# Patient Record
Sex: Female | Born: 1965 | Race: Black or African American | Hispanic: No | Marital: Single | Smoking: Never smoker
Health system: Southern US, Community
[De-identification: ages and names within clinical notes are randomized; demographics above are authoritative.]

## PROBLEM LIST (undated history)

## (undated) DIAGNOSIS — K648 Other hemorrhoids: Secondary | ICD-10-CM

## (undated) DIAGNOSIS — Z8719 Personal history of other diseases of the digestive system: Secondary | ICD-10-CM

## (undated) DIAGNOSIS — D219 Benign neoplasm of connective and other soft tissue, unspecified: Secondary | ICD-10-CM

## (undated) DIAGNOSIS — R2231 Localized swelling, mass and lump, right upper limb: Principal | ICD-10-CM

## (undated) DIAGNOSIS — F909 Attention-deficit hyperactivity disorder, unspecified type: Secondary | ICD-10-CM

## (undated) DIAGNOSIS — D649 Anemia, unspecified: Secondary | ICD-10-CM

## (undated) DIAGNOSIS — R7301 Impaired fasting glucose: Secondary | ICD-10-CM

## (undated) DIAGNOSIS — I10 Essential (primary) hypertension: Secondary | ICD-10-CM

## (undated) DIAGNOSIS — E559 Vitamin D deficiency, unspecified: Secondary | ICD-10-CM

## (undated) DIAGNOSIS — K5790 Diverticulosis of intestine, part unspecified, without perforation or abscess without bleeding: Secondary | ICD-10-CM

## (undated) DIAGNOSIS — M549 Dorsalgia, unspecified: Secondary | ICD-10-CM

## (undated) DIAGNOSIS — Z5189 Encounter for other specified aftercare: Secondary | ICD-10-CM

## (undated) DIAGNOSIS — E669 Obesity, unspecified: Secondary | ICD-10-CM

## (undated) HISTORY — DX: Impaired fasting glucose: R73.01

## (undated) HISTORY — DX: Dorsalgia, unspecified: M54.9

## (undated) HISTORY — DX: Anemia, unspecified: D64.9

## (undated) HISTORY — DX: Essential (primary) hypertension: I10

## (undated) HISTORY — PX: SKIN GRAFT: SHX250

## (undated) HISTORY — DX: Personal history of other diseases of the digestive system: Z87.19

## (undated) HISTORY — DX: Attention-deficit hyperactivity disorder, unspecified type: F90.9

## (undated) HISTORY — DX: Obesity, unspecified: E66.9

## (undated) HISTORY — DX: Encounter for other specified aftercare: Z51.89

## (undated) HISTORY — DX: Vitamin D deficiency, unspecified: E55.9

## (undated) HISTORY — DX: Other hemorrhoids: K64.8

## (undated) HISTORY — DX: Diverticulosis of intestine, part unspecified, without perforation or abscess without bleeding: K57.90

## (undated) HISTORY — DX: Benign neoplasm of connective and other soft tissue, unspecified: D21.9

## (undated) HISTORY — PX: OTHER SURGICAL HISTORY: SHX169

## (undated) HISTORY — PX: BREAST EXCISIONAL BIOPSY: SUR124

---

## 1989-09-22 HISTORY — PX: CHOLECYSTECTOMY: SHX55

## 1999-07-09 ENCOUNTER — Encounter: Payer: Self-pay | Admitting: Family Medicine

## 1999-07-09 ENCOUNTER — Ambulatory Visit (HOSPITAL_COMMUNITY): Admission: RE | Admit: 1999-07-09 | Discharge: 1999-07-09 | Payer: Self-pay | Admitting: Family Medicine

## 1999-09-17 ENCOUNTER — Ambulatory Visit: Admission: RE | Admit: 1999-09-17 | Discharge: 1999-09-17 | Payer: Self-pay | Admitting: Family Medicine

## 2000-09-03 ENCOUNTER — Encounter: Admission: RE | Admit: 2000-09-03 | Discharge: 2000-12-02 | Payer: Self-pay | Admitting: Family Medicine

## 2001-08-22 ENCOUNTER — Emergency Department (HOSPITAL_COMMUNITY): Admission: EM | Admit: 2001-08-22 | Discharge: 2001-08-22 | Payer: Self-pay

## 2003-05-23 ENCOUNTER — Ambulatory Visit (HOSPITAL_COMMUNITY): Admission: RE | Admit: 2003-05-23 | Discharge: 2003-05-23 | Payer: Self-pay | Admitting: Family Medicine

## 2003-05-23 ENCOUNTER — Encounter: Payer: Self-pay | Admitting: Family Medicine

## 2003-10-06 ENCOUNTER — Ambulatory Visit (HOSPITAL_COMMUNITY): Admission: RE | Admit: 2003-10-06 | Discharge: 2003-10-06 | Payer: Self-pay | Admitting: Obstetrics & Gynecology

## 2004-07-10 ENCOUNTER — Ambulatory Visit: Payer: Self-pay | Admitting: Internal Medicine

## 2004-07-15 ENCOUNTER — Ambulatory Visit: Payer: Self-pay | Admitting: Family Medicine

## 2004-08-19 ENCOUNTER — Ambulatory Visit: Payer: Self-pay | Admitting: Family Medicine

## 2004-08-19 ENCOUNTER — Ambulatory Visit: Payer: Self-pay | Admitting: *Deleted

## 2004-09-26 ENCOUNTER — Ambulatory Visit: Payer: Self-pay | Admitting: Family Medicine

## 2004-10-03 ENCOUNTER — Ambulatory Visit: Payer: Self-pay | Admitting: Family Medicine

## 2004-12-30 ENCOUNTER — Ambulatory Visit: Payer: Self-pay | Admitting: Internal Medicine

## 2005-01-13 ENCOUNTER — Ambulatory Visit: Payer: Self-pay | Admitting: Family Medicine

## 2005-10-13 ENCOUNTER — Ambulatory Visit: Payer: Self-pay | Admitting: Family Medicine

## 2006-02-02 ENCOUNTER — Ambulatory Visit: Payer: Self-pay | Admitting: Family Medicine

## 2006-06-17 ENCOUNTER — Ambulatory Visit: Payer: Self-pay | Admitting: Internal Medicine

## 2006-07-03 ENCOUNTER — Ambulatory Visit: Payer: Self-pay | Admitting: Internal Medicine

## 2007-07-19 ENCOUNTER — Ambulatory Visit: Payer: Self-pay | Admitting: Family Medicine

## 2007-07-29 ENCOUNTER — Encounter (INDEPENDENT_AMBULATORY_CARE_PROVIDER_SITE_OTHER): Payer: Self-pay | Admitting: Internal Medicine

## 2007-11-30 ENCOUNTER — Ambulatory Visit: Payer: Self-pay | Admitting: Family Medicine

## 2008-01-11 ENCOUNTER — Ambulatory Visit: Payer: Self-pay | Admitting: Family Medicine

## 2008-02-15 ENCOUNTER — Ambulatory Visit: Payer: Self-pay | Admitting: Family Medicine

## 2008-02-28 ENCOUNTER — Ambulatory Visit: Payer: Self-pay | Admitting: Family Medicine

## 2008-03-02 ENCOUNTER — Ambulatory Visit: Payer: Self-pay | Admitting: Family Medicine

## 2008-07-17 ENCOUNTER — Ambulatory Visit: Payer: Self-pay | Admitting: Family Medicine

## 2008-08-23 ENCOUNTER — Ambulatory Visit: Payer: Self-pay | Admitting: Family Medicine

## 2009-02-13 ENCOUNTER — Ambulatory Visit: Payer: Self-pay | Admitting: Family Medicine

## 2009-06-20 ENCOUNTER — Ambulatory Visit: Payer: Self-pay | Admitting: Family Medicine

## 2009-06-20 ENCOUNTER — Other Ambulatory Visit: Admission: RE | Admit: 2009-06-20 | Discharge: 2009-06-20 | Payer: Self-pay | Admitting: Family Medicine

## 2009-06-20 LAB — HM PAP SMEAR: HM Pap smear: NEGATIVE

## 2009-07-24 ENCOUNTER — Ambulatory Visit (HOSPITAL_COMMUNITY): Admission: RE | Admit: 2009-07-24 | Discharge: 2009-07-24 | Payer: Self-pay | Admitting: Family Medicine

## 2009-09-05 ENCOUNTER — Ambulatory Visit: Payer: Self-pay | Admitting: Family Medicine

## 2009-10-30 ENCOUNTER — Ambulatory Visit: Payer: Self-pay | Admitting: Family Medicine

## 2010-03-05 ENCOUNTER — Ambulatory Visit: Payer: Self-pay | Admitting: Physician Assistant

## 2010-04-03 ENCOUNTER — Ambulatory Visit: Payer: Self-pay | Admitting: Physician Assistant

## 2010-04-22 ENCOUNTER — Ambulatory Visit: Payer: Self-pay | Admitting: Physician Assistant

## 2010-10-11 ENCOUNTER — Ambulatory Visit (HOSPITAL_COMMUNITY)
Admission: RE | Admit: 2010-10-11 | Discharge: 2010-10-11 | Payer: Self-pay | Source: Home / Self Care | Attending: Family Medicine | Admitting: Family Medicine

## 2010-10-11 LAB — HM MAMMOGRAPHY

## 2010-10-18 ENCOUNTER — Encounter
Admission: RE | Admit: 2010-10-18 | Discharge: 2010-10-18 | Payer: Self-pay | Source: Home / Self Care | Attending: Family Medicine | Admitting: Family Medicine

## 2010-10-24 ENCOUNTER — Encounter: Payer: Self-pay | Admitting: Physician Assistant

## 2010-10-24 ENCOUNTER — Ambulatory Visit: Admit: 2010-10-24 | Payer: Self-pay | Admitting: Obstetrics & Gynecology

## 2010-11-04 ENCOUNTER — Other Ambulatory Visit: Payer: Self-pay | Admitting: Obstetrics and Gynecology

## 2010-11-04 ENCOUNTER — Other Ambulatory Visit (HOSPITAL_COMMUNITY)
Admission: RE | Admit: 2010-11-04 | Discharge: 2010-11-04 | Disposition: A | Discharge status: Home / Self Care | Payer: BC Managed Care – PPO | Source: Ambulatory Visit | Attending: Obstetrics and Gynecology | Admitting: Obstetrics and Gynecology

## 2010-11-04 ENCOUNTER — Encounter: Payer: BC Managed Care – PPO | Admitting: Obstetrics and Gynecology

## 2010-11-04 DIAGNOSIS — N841 Polyp of cervix uteri: Secondary | ICD-10-CM

## 2010-11-28 ENCOUNTER — Institutional Professional Consult (permissible substitution) (INDEPENDENT_AMBULATORY_CARE_PROVIDER_SITE_OTHER): Payer: BC Managed Care – PPO | Admitting: Family Medicine

## 2010-11-28 DIAGNOSIS — D649 Anemia, unspecified: Secondary | ICD-10-CM

## 2010-11-28 DIAGNOSIS — I10 Essential (primary) hypertension: Secondary | ICD-10-CM

## 2010-11-28 DIAGNOSIS — Z79899 Other long term (current) drug therapy: Secondary | ICD-10-CM

## 2010-11-28 DIAGNOSIS — E119 Type 2 diabetes mellitus without complications: Secondary | ICD-10-CM

## 2010-12-13 NOTE — Progress Notes (Unsigned)
Crystal [name], MAESTRE NO.:  [id number]  MEDICAL RECORD NO.:  [id number]           PATIENT TYPE:  A  LOCATION:  [facility]                   FACILITY:  [facility]  PHYSICIAN:  Argentina Donovan, MD        DATE OF BIRTH:  [birth year]  DATE OF SERVICE:  11/04/2010                                 CLINIC NOTE  HISTORY OF PRESENT ILLNESS:  The patient is a 45 year old gravida 4, para 2-0-2-2 with one C-section.  Both children above 17 years old who [facility].  She was recently there for routine Pap which have all been normal recently and she was told she had a cervical polyp, and that is why sent her.  She has had no problems.  No complaints, normal periods.  She did go for a mammogram, and they called her back for repeat and found a small cyst in the left breast with benign configuration, otherwise normal.  She has no complaints.  She takes Adderall, multivitamins, __________ and losartan.  PHYSICAL EXAMINATION:  VITAL SIGNS:  Today, her blood pressure is 136/81.  She weighs 281 pounds, she is 5 feet 5 inches tall and her age is 73. ABDOMEN:  Soft, flat, and nontender.  No masses.  No organomegaly. GENITALIA:  External is normal.  BUN is within normal limits.  Vagina is clean and well rugated.  Cervix is clean, parous and a small polyp was noted within the cervical os completely cervical and was easily removed with ring forceps and sent for pathological diagnosis.  I have reassured her that these are almost always normal and that we would not have her come back, and we would call if there is any abnormality.  IMPRESSION:  Asymptomatic cervical polyp.          ______________________________ Argentina Donovan, MD    PR/MEDQ  D:  11/04/2010  T:  11/05/2010  Job:  [id number]

## 2010-12-18 ENCOUNTER — Ambulatory Visit (INDEPENDENT_AMBULATORY_CARE_PROVIDER_SITE_OTHER): Payer: BC Managed Care – PPO | Admitting: Family Medicine

## 2010-12-18 DIAGNOSIS — J069 Acute upper respiratory infection, unspecified: Secondary | ICD-10-CM

## 2011-02-04 ENCOUNTER — Telehealth: Payer: Self-pay | Admitting: Family Medicine

## 2011-02-04 NOTE — Telephone Encounter (Signed)
[facility] for pt meds losartan hctz 100/25mg  #30 with prn refills

## 2011-03-06 ENCOUNTER — Telehealth: Payer: Self-pay | Admitting: Family Medicine

## 2011-03-06 DIAGNOSIS — F909 Attention-deficit hyperactivity disorder, unspecified type: Secondary | ICD-10-CM

## 2011-03-06 MED ORDER — AMPHETAMINE-DEXTROAMPHETAMINE 20 MG PO TABS: 20.0000 mg | ORAL_TABLET | Freq: Two times a day (BID) | ORAL | Status: DC

## 2011-03-06 NOTE — Telephone Encounter (Signed)
Called pt, she has been notified that rx is ready for pickup.

## 2011-03-06 NOTE — Telephone Encounter (Signed)
Advise pt rx is ready

## 2011-03-11 ENCOUNTER — Other Ambulatory Visit: Payer: Self-pay | Admitting: Family Medicine

## 2011-03-11 DIAGNOSIS — R922 Inconclusive mammogram: Secondary | ICD-10-CM

## 2011-03-20 ENCOUNTER — Ambulatory Visit
Admission: RE | Admit: 2011-03-20 | Discharge: 2011-03-20 | Disposition: A | Discharge status: Home / Self Care | Payer: BC Managed Care – PPO | Source: Ambulatory Visit | Attending: Family Medicine | Admitting: Family Medicine

## 2011-03-20 DIAGNOSIS — R922 Inconclusive mammogram: Secondary | ICD-10-CM

## 2011-05-28 ENCOUNTER — Encounter: Payer: Self-pay | Admitting: Family Medicine

## 2011-06-02 ENCOUNTER — Encounter: Payer: Self-pay | Admitting: Family Medicine

## 2011-06-02 ENCOUNTER — Ambulatory Visit (INDEPENDENT_AMBULATORY_CARE_PROVIDER_SITE_OTHER): Payer: BC Managed Care – PPO | Admitting: Family Medicine

## 2011-06-02 ENCOUNTER — Other Ambulatory Visit: Payer: Self-pay | Admitting: Family Medicine

## 2011-06-02 VITALS — BP 110/80 | HR 76 | Ht 65.5 in | Wt 276.0 lb

## 2011-06-02 DIAGNOSIS — Z23 Encounter for immunization: Secondary | ICD-10-CM

## 2011-06-02 DIAGNOSIS — D509 Iron deficiency anemia, unspecified: Secondary | ICD-10-CM

## 2011-06-02 DIAGNOSIS — J309 Allergic rhinitis, unspecified: Secondary | ICD-10-CM

## 2011-06-02 DIAGNOSIS — F909 Attention-deficit hyperactivity disorder, unspecified type: Secondary | ICD-10-CM

## 2011-06-02 DIAGNOSIS — E119 Type 2 diabetes mellitus without complications: Secondary | ICD-10-CM | POA: Insufficient documentation

## 2011-06-02 DIAGNOSIS — L680 Hirsutism: Secondary | ICD-10-CM

## 2011-06-02 DIAGNOSIS — F988 Other specified behavioral and emotional disorders with onset usually occurring in childhood and adolescence: Secondary | ICD-10-CM

## 2011-06-02 DIAGNOSIS — L0291 Cutaneous abscess, unspecified: Secondary | ICD-10-CM

## 2011-06-02 DIAGNOSIS — E785 Hyperlipidemia, unspecified: Secondary | ICD-10-CM

## 2011-06-02 DIAGNOSIS — I10 Essential (primary) hypertension: Secondary | ICD-10-CM | POA: Insufficient documentation

## 2011-06-02 LAB — CBC WITH DIFFERENTIAL/PLATELET
Basophils Relative: 0 % (ref 0–1)
Eosinophils Absolute: 0.3 10*3/uL (ref 0.0–0.7)
Hemoglobin: 10 g/dL — ABNORMAL LOW (ref 12.0–15.0)
Lymphs Abs: 2 10*3/uL (ref 0.7–4.0)
MCH: 22.7 pg — ABNORMAL LOW (ref 26.0–34.0)
Neutro Abs: 5.4 10*3/uL (ref 1.7–7.7)
Neutrophils Relative %: 67 % (ref 43–77)
Platelets: 370 10*3/uL (ref 150–400)
RBC: 4.41 MIL/uL (ref 3.87–5.11)

## 2011-06-02 LAB — POCT GLYCOSYLATED HEMOGLOBIN (HGB A1C): Hemoglobin A1C: 5.9

## 2011-06-02 LAB — LIPID PANEL
Cholesterol: 148 mg/dL (ref 0–200)
Triglycerides: 89 mg/dL (ref <150)

## 2011-06-02 MED ORDER — EFLORNITHINE HCL 13.9 % EX CREA: TOPICAL_CREAM | CUTANEOUS | Status: DC

## 2011-06-02 MED ORDER — CEPHALEXIN 500 MG PO CAPS: 500.0000 mg | ORAL_CAPSULE | Freq: Three times a day (TID) | ORAL | Status: AC

## 2011-06-02 MED ORDER — AMPHETAMINE-DEXTROAMPHETAMINE 20 MG PO TABS: 20.0000 mg | ORAL_TABLET | Freq: Two times a day (BID) | ORAL | Status: DC

## 2011-06-02 NOTE — Progress Notes (Signed)
Patient presents for f/u on multiple medical issues, as well as having acute concerns:  ADD--only takes the Adderall on days she has classes, and it works very well.  Denies side effects, and needs a refill.  DM--hasn't been checking her sugars at home.  Hasn't been as careful with her diet over the last 2 weeks since school started again, but was very good over the summer.  Doing water aerobics and walking, and some Zumba (exercising 4x/week).  Last eye exam was 09/2010.  Denies polydipsia or polyuria.  Checking feet regularly without concerns.  Anemia--taking iron once daily.  Just finished her menstrual cycle.  Energy is okay.  Hyperlipidemia--Admits to never starting the fish oil.  She is afraid of having a fishy odor to her urine, as this happens when she eats seafood.  HTN--She periodically checks her BP with her mother's cuff, running 117/77; hasn't checked in the last 2 weeks since she's been having headaches.  Patient is complaining of headaches x 2 weeks.  Having some runny nose, itchy throat and ears.  Having a R temporal headache, and below both eyes, more in the afternoons than mornings.  +chews gums.  Patient reports a boil under L axilla that drained about 3 weeks ago after using hot compresses. Still has residual discomfort. She no longer shaves axilla, changed to cutting hairs to avoid ingrown hairs.   Patient wants to try the Vaniqa that we previously discussed.  We previously discussed other options, and she would now like to try this.  Past Medical History  Diagnosis Date  . Hypertension   . Obesity   . ADHD (attention deficit hyperactivity disorder)   . Anemia   . Diabetes mellitus     AODM  . Impaired fasting glucose     Past Surgical History  Procedure Date  . Uterine 10/2010    POLYP    History   Social History  . Marital Status: Single    Spouse Name: N/A    Number of Children: N/A  . Years of Education: N/A   Occupational History  . Not on file.    Social History Main Topics  . Smoking status: Never Smoker   . Smokeless tobacco: Never Used  . Alcohol Use: No  . Drug Use: No  . Sexually Active: Not on file   Other Topics Concern  . Not on file   Social History Narrative   In school for business administration, as well as bus driver for [employer]   Current outpatient prescriptions:amphetamine-dextroamphetamine (ADDERALL, 20MG ,) 20 MG tablet, Take 1 tablet (20 mg total) by mouth 2 (two) times daily., Disp: 60 tablet, Rfl: 0;  Ferrous Sulfate (IRON) 325 (65 FE) MG TABS, Take 1 tablet by mouth daily.  , Disp: , Rfl: ;  Loratadine 10 MG CAPS, Take 1 capsule by mouth daily. As needed for allergies , Disp: , Rfl:  losartan-hydrochlorothiazide (HYZAAR) 100-25 MG per tablet, Take 1 tablet by mouth daily.  , Disp: , Rfl: ;  Saxagliptin-Metformin (KOMBIGLYZE XR) 2.01-999 MG TB24, Take 1,000 mg by mouth daily.  , Disp: , Rfl: ;  cephALEXin (KEFLEX) 500 MG capsule, Take 1 capsule (500 mg total) by mouth 3 (three) times daily., Disp: 30 capsule, Rfl: 0;  Eflornithine HCl (VANIQA) 13.9 % cream, Apply to affect areas twice daily, Disp: 30 g, Rfl: 5  No Known Allergies  ROS: Denies fevers; mucus drainage is clear.  Slight cough from PND; denies SOB or wheezing.  Denies any GI  effects, myalgias, chest pain, palpitations, edema  PHYSICAL EXAM: BP 110/80  Pulse 76  Ht 5' 5.5" (1.664 m)  Wt 276 lb (125.193 kg)  BMI 45.23 kg/m2  LMP 05/24/2011 Well developed, pleasant obese female in no distress HEENT: PERRL, EOMI, conjunctiva clear; Nasal mucosa moderately edematous, pale/bluish. Sinuses nontender, OP clear Heart: regular rate and rhythm without murmur Lungs: clear bilaterally Neck: no lymphadenopathy, thyromegaly or carotid bruit Abdomen: obese, nontender, no organomegaly or mass Extremities: trace edema, 2+ pulse, normal sensation Skin: L axilla--recent abscess--there is some residual induration, no erythema/warmth/fluctuance.   No lymphadenopathy. Psych: normal mood, affect, hygiene and grooming  ASSESSMENT/PLAN: 1. Type II or unspecified type diabetes mellitus without mention of complication, not stated as uncontrolled  POCT HgB A1C  2. ADD (attention deficit disorder)    3. Essential hypertension, benign    4. Dyslipidemia  Lipid panel  5. Allergic rhinitis, cause unspecified    6. ADHD (attention deficit hyperactivity disorder)  amphetamine-dextroamphetamine (ADDERALL, 20MG ,) 20 MG tablet  7. Anemia, iron deficiency  CBC with Differential  8. Abscess of skin  cephALEXin (KEFLEX) 500 MG capsule  9. Hirsutism  Eflornithine HCl (VANIQA) 13.9 % cream  10. Need for prophylactic vaccination and inoculation against influenza  Flu vaccine greater than or equal to 3yo preservative free IM    Chart reviewed. Last Td 2004. No pneumovax. Labs 11/2010 Hg 10.3, Hct 33.7, normal c-met.  Lipids chol 148, HDL 34, LDL 92, ratio 4.4.  Last TSH 02/2010  DM--A1c 5.9; well controlled; continue current meds; weight loss encouraged HTN--well controlled.  Recommended increase exercise, resume proper diet, and attempt weight loss Abscess L axilla--partially treated by draining on its own.  Will cover with Keflex for any residual infection Headaches--component of sinus headache and tension headache.  Recommend cutting back on gum chewing, and use of Tylenol and/or ibuprofen prn for headaches, in addition to allergy medication  Discussed but declined pneumovax.  Will get flu shot today.  At some point consider getting TdaP

## 2011-06-02 NOTE — Patient Instructions (Addendum)
Consider pneumovax (pneumonia vaccine) Consider getting TdaP (tetanus booster with pertussis) Continue with warm compresses to left underarm area.  Return for re-evaluation if the area becomes increasingly swollen, red, tender, etc.

## 2011-06-04 ENCOUNTER — Telehealth: Payer: Self-pay | Admitting: *Deleted

## 2011-06-04 NOTE — Telephone Encounter (Signed)
Left message for patient to return my call to go over lab results. 

## 2011-06-04 NOTE — Telephone Encounter (Signed)
Spoke with patient and went over lab results with her. She will begin taking her iron supplement twice daily with meals and start taking fish oil. Patient to follow up with Dr.Knapp in 6 months ~11/30/11.

## 2011-08-12 ENCOUNTER — Telehealth: Payer: Self-pay | Admitting: Family Medicine

## 2011-08-12 DIAGNOSIS — F909 Attention-deficit hyperactivity disorder, unspecified type: Secondary | ICD-10-CM

## 2011-08-12 MED ORDER — AMPHETAMINE-DEXTROAMPHETAMINE 20 MG PO TABS: 20.0000 mg | ORAL_TABLET | Freq: Two times a day (BID) | ORAL | Status: DC

## 2011-08-12 NOTE — Telephone Encounter (Signed)
Left message for pt to call back  °

## 2011-08-12 NOTE — Telephone Encounter (Signed)
Printed/signed.  Advise pt ready for pick up

## 2011-08-12 NOTE — Telephone Encounter (Signed)
Pt need rx refill on Adderall 20 mg  Bid

## 2011-08-13 NOTE — Telephone Encounter (Signed)
Pt notified and will come by and pick up

## 2011-08-18 ENCOUNTER — Other Ambulatory Visit: Payer: Self-pay | Admitting: Family Medicine

## 2011-08-18 DIAGNOSIS — N6320 Unspecified lump in the left breast, unspecified quadrant: Secondary | ICD-10-CM

## 2011-08-18 DIAGNOSIS — R9230 Dense breasts, unspecified: Secondary | ICD-10-CM

## 2011-08-18 DIAGNOSIS — R922 Inconclusive mammogram: Secondary | ICD-10-CM

## 2011-09-03 ENCOUNTER — Ambulatory Visit
Admission: RE | Admit: 2011-09-03 | Discharge: 2011-09-03 | Disposition: A | Discharge status: Home / Self Care | Payer: BC Managed Care – PPO | Source: Ambulatory Visit | Attending: Family Medicine | Admitting: Family Medicine

## 2011-09-03 ENCOUNTER — Other Ambulatory Visit: Payer: Self-pay | Admitting: Family Medicine

## 2011-09-03 DIAGNOSIS — N6320 Unspecified lump in the left breast, unspecified quadrant: Secondary | ICD-10-CM

## 2011-09-03 DIAGNOSIS — R9230 Dense breasts, unspecified: Secondary | ICD-10-CM

## 2011-09-03 DIAGNOSIS — R922 Inconclusive mammogram: Secondary | ICD-10-CM

## 2011-10-08 ENCOUNTER — Encounter: Payer: Self-pay | Admitting: *Deleted

## 2011-10-09 ENCOUNTER — Telehealth: Payer: Self-pay | Admitting: Internal Medicine

## 2011-10-09 DIAGNOSIS — F909 Attention-deficit hyperactivity disorder, unspecified type: Secondary | ICD-10-CM

## 2011-10-09 MED ORDER — AMPHETAMINE-DEXTROAMPHETAMINE 20 MG PO TABS: 20.0000 mg | ORAL_TABLET | Freq: Two times a day (BID) | ORAL | Status: DC

## 2011-10-09 NOTE — Telephone Encounter (Signed)
Patient notified that her rx is ready for pickup, she also scheduled 30 min med ck for 12/11/11 ! 9:45am.

## 2011-10-09 NOTE — Telephone Encounter (Signed)
She is due for 6 month med check in March (30 min).  Please schedule.  Rx printed/signed

## 2011-11-20 ENCOUNTER — Encounter: Payer: BC Managed Care – PPO | Admitting: Obstetrics & Gynecology

## 2011-11-25 ENCOUNTER — Other Ambulatory Visit: Payer: Self-pay | Admitting: Internal Medicine

## 2011-11-25 ENCOUNTER — Encounter: Payer: Self-pay | Admitting: Family Medicine

## 2011-11-25 DIAGNOSIS — F909 Attention-deficit hyperactivity disorder, unspecified type: Secondary | ICD-10-CM

## 2011-11-25 MED ORDER — AMPHETAMINE-DEXTROAMPHETAMINE 20 MG PO TABS: 20.0000 mg | ORAL_TABLET | Freq: Two times a day (BID) | ORAL | Status: DC

## 2011-11-25 NOTE — Telephone Encounter (Signed)
Please print for me to sign Wed.  She has f/u scheduled for later this month (3/21)

## 2011-11-25 NOTE — Telephone Encounter (Signed)
Printed and laid on your desk to sign

## 2011-12-10 ENCOUNTER — Encounter: Payer: BC Managed Care – PPO | Admitting: Obstetrics & Gynecology

## 2011-12-11 ENCOUNTER — Encounter: Payer: BC Managed Care – PPO | Admitting: Family Medicine

## 2011-12-25 ENCOUNTER — Encounter: Payer: Self-pay | Admitting: Family Medicine

## 2011-12-25 ENCOUNTER — Ambulatory Visit (INDEPENDENT_AMBULATORY_CARE_PROVIDER_SITE_OTHER): Payer: BC Managed Care – PPO | Admitting: Family Medicine

## 2011-12-25 VITALS — BP 120/70 | HR 80 | Ht 65.0 in | Wt 283.0 lb

## 2011-12-25 DIAGNOSIS — E119 Type 2 diabetes mellitus without complications: Secondary | ICD-10-CM

## 2011-12-25 DIAGNOSIS — D509 Iron deficiency anemia, unspecified: Secondary | ICD-10-CM

## 2011-12-25 DIAGNOSIS — F988 Other specified behavioral and emotional disorders with onset usually occurring in childhood and adolescence: Secondary | ICD-10-CM

## 2011-12-25 DIAGNOSIS — F909 Attention-deficit hyperactivity disorder, unspecified type: Secondary | ICD-10-CM

## 2011-12-25 DIAGNOSIS — I10 Essential (primary) hypertension: Secondary | ICD-10-CM

## 2011-12-25 LAB — COMPREHENSIVE METABOLIC PANEL
ALT: 13 U/L (ref 0–35)
Albumin: 3.7 g/dL (ref 3.5–5.2)
CO2: 29 mEq/L (ref 19–32)
Calcium: 9 mg/dL (ref 8.4–10.5)
Chloride: 104 mEq/L (ref 96–112)
Glucose, Bld: 110 mg/dL — ABNORMAL HIGH (ref 70–99)
Sodium: 141 mEq/L (ref 135–145)
Total Protein: 6.8 g/dL (ref 6.0–8.3)

## 2011-12-25 LAB — POCT GLYCOSYLATED HEMOGLOBIN (HGB A1C): Hemoglobin A1C: 6.2

## 2011-12-25 MED ORDER — AMPHETAMINE-DEXTROAMPHETAMINE 20 MG PO TABS: 20.0000 mg | ORAL_TABLET | Freq: Two times a day (BID) | ORAL | Status: DC

## 2011-12-25 MED ORDER — METFORMIN HCL ER 500 MG PO TB24: 1000.0000 mg | ORAL_TABLET | Freq: Every day | ORAL | Status: DC

## 2011-12-25 NOTE — Patient Instructions (Signed)
Exercise daily.  Diet for GERD or PUD Nutrition therapy can help ease the discomfort of gastroesophageal reflux disease (GERD) and peptic ulcer disease (PUD).  HOME CARE INSTRUCTIONS   Eat your meals slowly, in a relaxed setting.   Eat 5 to 6 small meals per day.   If a food causes distress, stop eating it for a period of time.  FOODS TO AVOID  Coffee, regular or decaffeinated.   Cola beverages, regular or low calorie.   Tea, regular or decaffeinated.   Pepper.   Cocoa.   High fat foods, including meats.   Butter, margarine, hydrogenated oil (trans fats).   Peppermint or spearmint (if you have GERD).   Fruits and vegetables if not tolerated.   Alcohol.   Nicotine (smoking or chewing). This is one of the most potent stimulants to acid production in the gastrointestinal tract.   Any food that seems to aggravate your condition.  If you have questions regarding your diet, ask your caregiver or a registered dietitian. TIPS  Lying flat may make symptoms worse. Keep the head of your bed raised 6 to 9 inches (15 to 23 cm) by using a foam wedge or blocks under the legs of the bed.   Do not lay down until 3 hours after eating a meal.   Daily physical activity may help reduce symptoms.  MAKE SURE YOU:   Understand these instructions.   Will watch your condition.   Will get help right away if you are not doing well or get worse.  Document Released: 09/08/2005 Document Revised: 08/28/2011 Document Reviewed: 07/25/2011 [facility] Patient Information 2012 [place], [place].

## 2011-12-25 NOTE — Progress Notes (Signed)
Chief complaint:  6 month med checkon diabetes, hypertension and ADD. Pt is on Kombiglyze and it is making her eyes really sesnsitive, metformin mader her nauseous but she feels that she can tolerate it if she could go back on metformin. Also taking adderall 20mg  BID and feels that she needs an increase to possibly TID as she is in school currently and BID is not effective  HPI:   DM:  Sugars are averaging 94 on the Kombiglyze. Has been on this for over a year.  Has noticed eye sensitivity for about a year also, she is wondering if it could be related to medicine.  Her eye exam is UTD and told everything was fine.  Eyes are sensitive to light.  Also has allergies, and is taking OTC pill for allergies, which helps with the watery eyes, but still seem sensitive, which isn't related to eye sensitivity (occurs all year long, allergies just started flaring).  Wondering if she could go back to just plain metformin.  Review of chart shows that she had nausea with metformin in the past (not ER).  Admits to stress eating since January, and not having time to exercise since starting math class.  HTN:  BP's at pharmacy (using it on her wrist only, due to arm size) 132/92 (? Accuracy).  Denies dizziness, has some stress-related headaches.  Denies chest pain, just some heartburn. Denies swelling, but getting some leg cramps.  ADD:  Previously was taking 20mg  BID, but when she started up a new, very difficult math class, had a lot of trouble with it.  Increased to 2 tablets in the morning, and 1 at 2pm and this has been working well for her.  Has been doing this x 3 months.  She doesn't take meds on the weekends, or when she is out of class (ie spring break), just 4 days/week, so the 60 tabs/month have been lasting the month.  Anemia: takes iron every other day, just once daily.  On her cycle right now. Never took it daily as recommended due to risk of constipation.  Lipids--hasn't been exercising much since January  (HDL has been low).  She reports that she bought fish oil capsules, but hasn't started taking them.  Past Medical History  Diagnosis Date  . Hypertension   . Obesity   . ADHD (attention deficit hyperactivity disorder)   . Anemia   . Diabetes mellitus     AODM  . Impaired fasting glucose     Past Surgical History  Procedure Date  . Uterine 10/2010    POLYP    History   Social History  . Marital Status: Single    Spouse Name: N/A    Number of Children: N/A  . Years of Education: N/A   Occupational History  . Not on file.   Social History Main Topics  . Smoking status: Never Smoker   . Smokeless tobacco: Never Used  . Alcohol Use: No  . Drug Use: No  . Sexually Active: Not on file   Other Topics Concern  . Not on file   Social History Narrative   In school for business administration, as well as bus driver for [employer]   Current Outpatient Prescriptions on File Prior to Visit  Medication Sig Dispense Refill  . Ferrous Sulfate (IRON) 325 (65 FE) MG TABS Take 1 tablet by mouth daily.        . Loratadine 10 MG CAPS Take 1 capsule by mouth daily. As needed  for allergies       . losartan-hydrochlorothiazide (HYZAAR) 100-25 MG per tablet Take 1 tablet by mouth daily.        Marland Kitchen DISCONTD: amphetamine-dextroamphetamine (ADDERALL, 20MG ,) 20 MG tablet Take 1 tablet (20 mg total) by mouth 2 (two) times daily.  60 tablet  0  . Eflornithine HCl (VANIQA) 13.9 % cream Apply to affect areas twice daily  30 g  5  . metFORMIN (GLUCOPHAGE-XR) 500 MG 24 hr tablet Take 2 tablets (1,000 mg total) by mouth daily with breakfast.  60 tablet  3   No Known Allergies  ROS:  Denies fevers, URI symptoms, cough, shortness of breath, +occasional heartburn.  Denies skin rash, bowel changes, numbness, tingling, dizziness or other concerns.  PHYSICAL EXAM: BP 120/70  Pulse 80  Ht 5\' 5"  (1.651 m)  Wt 283 lb (128.368 kg)  BMI 47.09 kg/m2  LMP 12/21/2011 Pleasant obese female in no  distress Heart: regular rate and rhythm without murmur  Lungs: clear bilaterally  Neck: no lymphadenopathy, thyromegaly or carotid bruit  Abdomen: obese, nontender, no organomegaly or mass  Extremities: no edema, 2+ pulse, normal sensation  Skin: no rashes.  Normal diabetic foot exam Psych: normal mood, affect, hygiene and grooming  Lab Results  Component Value Date   HGBA1C 6.2 12/25/2011   ASSESSMENT/PLAN:  1. Type II or unspecified type diabetes mellitus without mention of complication, not stated as uncontrolled  POCT HgB A1C, Comprehensive metabolic panel, metFORMIN (GLUCOPHAGE-XR) 500 MG 24 hr tablet  2. ADD (attention deficit disorder)    3. Anemia, iron deficiency  CBC with Differential  4. Essential hypertension, benign  Comprehensive metabolic panel  5. ADHD (attention deficit hyperactivity disorder)  amphetamine-dextroamphetamine (ADDERALL, 20MG ,) 20 MG tablet   DM--well controlled on current meds.  Pt wants to try getting off the kombiglyze, thinking it may be contributing to eye sensitivity.  Previously had some issues with metformin alone, but tolerating 1000mg  of the ER that is in the kombiglyze. Change to metformin ER  500mg , 2 tabs every evening.  Watch diet, exercise, lose weight, monitor sugars.  Recheck in 3 months (do A1c at f/u visit).  ADD:  Okay to continue current dose of 40 mgs in the morning, and 20 in the afternoons, on the days she has her classes.  Quantity of 60/month has still been enough, even at higher dose, because only takes meds 4 days/week.  Anemia--recheck today.  Likely will need to take iron more regularly--recommended taking with stool softener to avoid constipation.  Low HDL--restart exercise, and try the fish oil that she spent her money on  GERD--diet reviewed.  See handout.  Cmet, CBC today  F/u 3 months (nonfasting, A1c at visit)

## 2011-12-26 LAB — CBC WITH DIFFERENTIAL/PLATELET
Basophils Absolute: 0 10*3/uL (ref 0.0–0.1)
Eosinophils Relative: 4 % (ref 0–5)
Lymphocytes Relative: 25 % (ref 12–46)
Neutro Abs: 4.1 10*3/uL (ref 1.7–7.7)
Neutrophils Relative %: 65 % (ref 43–77)
Platelets: 349 10*3/uL (ref 150–400)
RDW: 17 % — ABNORMAL HIGH (ref 11.5–15.5)
WBC: 6.4 10*3/uL (ref 4.0–10.5)

## 2011-12-29 ENCOUNTER — Telehealth: Payer: Self-pay | Admitting: Internal Medicine

## 2011-12-29 DIAGNOSIS — E119 Type 2 diabetes mellitus without complications: Secondary | ICD-10-CM

## 2011-12-29 MED ORDER — METFORMIN HCL ER 500 MG PO TB24: 1000.0000 mg | ORAL_TABLET | Freq: Every day | ORAL | Status: DC

## 2011-12-29 NOTE — Telephone Encounter (Signed)
Per pt request, i changed her pharmacy to [facility] on [address].

## 2012-01-07 ENCOUNTER — Telehealth: Payer: Self-pay | Admitting: Family Medicine

## 2012-01-07 NOTE — Telephone Encounter (Signed)
LM

## 2012-01-21 ENCOUNTER — Encounter: Payer: BC Managed Care – PPO | Admitting: Obstetrics & Gynecology

## 2012-02-09 ENCOUNTER — Other Ambulatory Visit: Payer: Self-pay | Admitting: Family Medicine

## 2012-02-09 NOTE — Telephone Encounter (Signed)
I think this is one of your pt

## 2012-02-09 NOTE — Telephone Encounter (Signed)
Pt needs refill on [place]   [facility]

## 2012-02-09 NOTE — Telephone Encounter (Signed)
done

## 2012-02-18 HISTORY — PX: KNEE ARTHROSCOPY: SHX127

## 2012-03-29 ENCOUNTER — Ambulatory Visit (INDEPENDENT_AMBULATORY_CARE_PROVIDER_SITE_OTHER): Payer: BC Managed Care – PPO | Admitting: Family Medicine

## 2012-03-29 ENCOUNTER — Encounter: Payer: Self-pay | Admitting: Family Medicine

## 2012-03-29 VITALS — BP 114/78 | HR 80 | Ht 65.5 in | Wt 288.0 lb

## 2012-03-29 DIAGNOSIS — D649 Anemia, unspecified: Secondary | ICD-10-CM

## 2012-03-29 DIAGNOSIS — I10 Essential (primary) hypertension: Secondary | ICD-10-CM

## 2012-03-29 DIAGNOSIS — E119 Type 2 diabetes mellitus without complications: Secondary | ICD-10-CM

## 2012-03-29 LAB — POCT GLYCOSYLATED HEMOGLOBIN (HGB A1C): Hemoglobin A1C: 6

## 2012-03-29 LAB — CBC WITH DIFFERENTIAL/PLATELET
Eosinophils Absolute: 0.3 10*3/uL (ref 0.0–0.7)
Hemoglobin: 10.9 g/dL — ABNORMAL LOW (ref 12.0–15.0)
Lymphocytes Relative: 19 % (ref 12–46)
Lymphs Abs: 1.5 10*3/uL (ref 0.7–4.0)
Monocytes Relative: 5 % (ref 3–12)
Neutro Abs: 5.7 10*3/uL (ref 1.7–7.7)
Neutrophils Relative %: 73 % (ref 43–77)
Platelets: 344 10*3/uL (ref 150–400)
RBC: 4.67 MIL/uL (ref 3.87–5.11)
WBC: 7.9 10*3/uL (ref 4.0–10.5)

## 2012-03-29 LAB — FERRITIN: Ferritin: 15 ng/mL (ref 10–291)

## 2012-03-29 MED ORDER — METFORMIN HCL ER 500 MG PO TB24: 1000.0000 mg | ORAL_TABLET | Freq: Every day | ORAL | Status: DC

## 2012-03-29 NOTE — Progress Notes (Signed)
Chief Complaint  Patient presents with  . Diabetes    3 month med check-fasting.   HPI:  DM--at last visit, patient wanted to try coming off Kombiglyze, thinking it was contributing to some eye symptoms, sensitivity.  She was changed from [facility] to metformin ER 500mg , 2 tabs daily, and is due for recheck A1c today.  She had surgery on her knee, and people have been bringing food, and admits to eating poorly.  Sugars have been running 97-102 (mid-day, about 3 hrs after a meal).  Eye symptoms have resolved since being off the Onglyza portion of kombiglyze.  She is tolerating the metformin, some frequency of bowel movements, but no diarrhea (3x/day).  Anemia--she increased her iron to twice daily, denies constipation.  Energy remains the same, denies fatigue.  She continues to have heavy periods.  Denies any black or bloody bowel movements.  Past Medical History  Diagnosis Date  . Hypertension   . Obesity   . ADHD (attention deficit hyperactivity disorder)   . Anemia   . Diabetes mellitus     AODM  . Impaired fasting glucose    Past Surgical History  Procedure Date  . Uterine 10/2010    POLYP  . Knee arthroscopy 02/18/12    left (torn meniscus); Dr. Sherlean Foot    Current Outpatient Prescriptions on File Prior to Visit  Medication Sig Dispense Refill  . Ferrous Sulfate (IRON) 325 (65 FE) MG TABS Take 1 tablet by mouth daily.        . Loratadine 10 MG CAPS Take 1 capsule by mouth daily. As needed for allergies       . losartan-hydrochlorothiazide (HYZAAR) 100-25 MG per tablet TAKE 1 TABLET EVERY DAY  30 tablet  5  . metFORMIN (GLUCOPHAGE-XR) 500 MG 24 hr tablet Take 2 tablets (1,000 mg total) by mouth daily with breakfast.  60 tablet  3  . Multiple Vitamins-Minerals (MULTIVITAMIN WITH MINERALS) tablet Take 1 tablet by mouth daily.      Marland Kitchen amphetamine-dextroamphetamine (ADDERALL, 20MG ,) 20 MG tablet Take 1 tablet (20 mg total) by mouth 2 (two) times daily. Take 2 tablets in the morning  and 1 tablet in the evening, as directed  60 tablet  0   Allergies  Allergen Reactions  . Onglyza (Saxagliptin) Other (See Comments)    Eye irritation (when on Kombiglyze; tolerates metformin without problems)   ROS:  Denies fevers, URI symptoms.  Knee pain s/p surgery, improving.  In PT. Denies dizziness, fatigue, diarrhea, cough, shortness of breath, chest pain.  Not taking Adderall right now since she isn't in school.  Refill not needed yet.  PHYSICAL EXAM: BP 114/78  Pulse 80  Ht 5' 5.5" (1.664 m)  Wt 288 lb (130.636 kg)  BMI 47.20 kg/m2  LMP 03/21/2012 Well developed, pleasant obese female in no distress Normal mood, affect, hygiene and grooming  ASSESSMENT/PLAN 1. Type II or unspecified type diabetes mellitus without mention of complication, not stated as uncontrolled  HgB A1c, metFORMIN (GLUCOPHAGE-XR) 500 MG 24 hr tablet, Microalbumin / creatinine urine ratio  2. Essential hypertension, benign    3. Anemia  CBC with Differential, Ferritin   DM--remains well controlled on metformin ER alone, and eye symptoms resolved off kombiglyze  Anemia--recheck CBC today.  Given hemoccult kit.  Likely is related to heavy menstrual cycles, but if hemoccult cards are +, then will need colonoscopy (not wait til age 69).  HTN--well controlled  F/u 6 months, sooner prn

## 2012-03-29 NOTE — Patient Instructions (Signed)
Continue to monitor your sugars.  Try and exercise at least 30 minutes daily.  Make smart food choices, and portion control.  Goal is to lose weight by lifestyle modification.  Consider Weight [employer]chers.

## 2012-03-30 LAB — MICROALBUMIN / CREATININE URINE RATIO
Creatinine, Urine: 214.8 mg/dL
Microalb Creat Ratio: 2.4 mg/g (ref 0.0–30.0)
Microalb, Ur: 0.51 mg/dL (ref 0.00–1.89)

## 2012-05-05 ENCOUNTER — Telehealth: Payer: Self-pay | Admitting: Family Medicine

## 2012-05-05 DIAGNOSIS — F909 Attention-deficit hyperactivity disorder, unspecified type: Secondary | ICD-10-CM

## 2012-05-05 MED ORDER — AMPHETAMINE-DEXTROAMPHETAMINE 20 MG PO TABS: 20.0000 mg | ORAL_TABLET | Freq: Two times a day (BID) | ORAL | Status: DC

## 2012-05-05 NOTE — Telephone Encounter (Signed)
Needs Adderal  Please call when ready

## 2012-05-05 NOTE — Telephone Encounter (Signed)
Adderall renewed at present dosing regimen

## 2012-05-06 ENCOUNTER — Telehealth: Payer: Self-pay | Admitting: Family Medicine

## 2012-05-06 NOTE — Telephone Encounter (Signed)
LM

## 2012-06-30 ENCOUNTER — Other Ambulatory Visit: Payer: Self-pay | Admitting: Family Medicine

## 2012-06-30 DIAGNOSIS — F909 Attention-deficit hyperactivity disorder, unspecified type: Secondary | ICD-10-CM

## 2012-06-30 MED ORDER — AMPHETAMINE-DEXTROAMPHETAMINE 20 MG PO TABS: ORAL_TABLET | ORAL | Status: DC

## 2012-06-30 NOTE — Telephone Encounter (Signed)
Printed / signed

## 2012-06-30 NOTE — Telephone Encounter (Signed)
Pt informed that rx is ready to be picked up

## 2012-09-02 ENCOUNTER — Other Ambulatory Visit: Payer: Self-pay | Admitting: Family Medicine

## 2012-09-02 ENCOUNTER — Telehealth: Payer: Self-pay | Admitting: Family Medicine

## 2012-09-02 DIAGNOSIS — Z1231 Encounter for screening mammogram for malignant neoplasm of breast: Secondary | ICD-10-CM

## 2012-09-02 MED ORDER — LOSARTAN POTASSIUM-HCTZ 100-25 MG PO TABS: 1.0000 | ORAL_TABLET | Freq: Every day | ORAL | Status: DC

## 2012-09-02 NOTE — Telephone Encounter (Signed)
Do to v not here till next week or Dr.Knapp sent in med with 0 refills

## 2012-09-05 ENCOUNTER — Other Ambulatory Visit: Payer: Self-pay | Admitting: Family Medicine

## 2012-09-14 ENCOUNTER — Ambulatory Visit
Admission: RE | Admit: 2012-09-14 | Discharge: 2012-09-14 | Disposition: A | Discharge status: Home / Self Care | Payer: BC Managed Care – PPO | Source: Ambulatory Visit | Attending: Family Medicine | Admitting: Family Medicine

## 2012-09-14 DIAGNOSIS — Z1231 Encounter for screening mammogram for malignant neoplasm of breast: Secondary | ICD-10-CM

## 2012-09-29 ENCOUNTER — Telehealth: Payer: Self-pay | Admitting: Family Medicine

## 2012-09-29 DIAGNOSIS — F909 Attention-deficit hyperactivity disorder, unspecified type: Secondary | ICD-10-CM

## 2012-09-29 MED ORDER — AMPHETAMINE-DEXTROAMPHETAMINE 20 MG PO TABS: ORAL_TABLET | ORAL | Status: DC

## 2012-09-29 NOTE — Telephone Encounter (Signed)
Printed / signed

## 2012-09-30 NOTE — Telephone Encounter (Signed)
Pt informed rx Adderall ready for pick up

## 2012-10-21 ENCOUNTER — Encounter: Payer: Self-pay | Admitting: Family Medicine

## 2012-10-21 ENCOUNTER — Ambulatory Visit (INDEPENDENT_AMBULATORY_CARE_PROVIDER_SITE_OTHER): Payer: BC Managed Care – PPO | Admitting: Family Medicine

## 2012-10-21 VITALS — BP 136/84 | HR 60 | Ht 65.5 in | Wt 284.0 lb

## 2012-10-21 DIAGNOSIS — D509 Iron deficiency anemia, unspecified: Secondary | ICD-10-CM

## 2012-10-21 DIAGNOSIS — Z23 Encounter for immunization: Secondary | ICD-10-CM

## 2012-10-21 DIAGNOSIS — L659 Nonscarring hair loss, unspecified: Secondary | ICD-10-CM

## 2012-10-21 DIAGNOSIS — F3289 Other specified depressive episodes: Secondary | ICD-10-CM

## 2012-10-21 DIAGNOSIS — E785 Hyperlipidemia, unspecified: Secondary | ICD-10-CM

## 2012-10-21 DIAGNOSIS — F909 Attention-deficit hyperactivity disorder, unspecified type: Secondary | ICD-10-CM

## 2012-10-21 DIAGNOSIS — E119 Type 2 diabetes mellitus without complications: Secondary | ICD-10-CM

## 2012-10-21 DIAGNOSIS — F329 Major depressive disorder, single episode, unspecified: Secondary | ICD-10-CM

## 2012-10-21 DIAGNOSIS — I10 Essential (primary) hypertension: Secondary | ICD-10-CM

## 2012-10-21 LAB — COMPREHENSIVE METABOLIC PANEL
ALT: 9 U/L (ref 0–35)
BUN: 10 mg/dL (ref 6–23)
CO2: 30 mEq/L (ref 19–32)
Calcium: 9 mg/dL (ref 8.4–10.5)
Creat: 0.76 mg/dL (ref 0.50–1.10)
Total Bilirubin: 0.2 mg/dL — ABNORMAL LOW (ref 0.3–1.2)

## 2012-10-21 LAB — CBC WITH DIFFERENTIAL/PLATELET
Eosinophils Relative: 2 % (ref 0–5)
HCT: 34.4 % — ABNORMAL LOW (ref 36.0–46.0)
Hemoglobin: 11.1 g/dL — ABNORMAL LOW (ref 12.0–15.0)
Lymphocytes Relative: 26 % (ref 12–46)
Lymphs Abs: 2.1 10*3/uL (ref 0.7–4.0)
MCV: 78.4 fL (ref 78.0–100.0)
Monocytes Absolute: 0.4 10*3/uL (ref 0.1–1.0)
Monocytes Relative: 5 % (ref 3–12)
Neutro Abs: 5.2 10*3/uL (ref 1.7–7.7)
RBC: 4.39 MIL/uL (ref 3.87–5.11)
RDW: 16.7 % — ABNORMAL HIGH (ref 11.5–15.5)
WBC: 7.9 10*3/uL (ref 4.0–10.5)

## 2012-10-21 LAB — LIPID PANEL
Cholesterol: 115 mg/dL (ref 0–200)
HDL: 29 mg/dL — ABNORMAL LOW (ref >39)
LDL Cholesterol: 70 mg/dL (ref 0–99)
Triglycerides: 80 mg/dL (ref <150)

## 2012-10-21 LAB — FERRITIN: Ferritin: 12 ng/mL (ref 10–291)

## 2012-10-21 MED ORDER — METFORMIN HCL ER 500 MG PO TB24: 1000.0000 mg | ORAL_TABLET | Freq: Every day | ORAL | Status: DC

## 2012-10-21 MED ORDER — LOSARTAN POTASSIUM-HCTZ 100-25 MG PO TABS: 1.0000 | ORAL_TABLET | Freq: Every day | ORAL | Status: DC

## 2012-10-21 NOTE — Patient Instructions (Signed)
I recommend you consider counseling to help with your moods. You can check with your job to see if there is an EAP.  Berniece Andreas (with [facility]) and Thea Silversmith (on [employer]) are good therapists.

## 2012-10-21 NOTE — Progress Notes (Signed)
Chief Complaint  Patient presents with  . Diabetes    fasting 6 month follow up.   HPI:  Diabetes follow-up:  Having some highs and lows in her moods; binge eating.  Blood sugars at home are running higher than they had been 130-140's (max of 200s immediately after eating).  Denies hypoglycemia.  Denies polydipsia and polyuria.  Last eye exam was June or July 2013.  Patient checks feet regularly without concerns.  She is compliant with taking her medication, on metformin.  No further eye symptoms since stopping the onglyza portion.  Drinks Kool-Aid frequently.  Hypertension follow-up:  Blood pressures are not checked elsewhere.  Denies dizziness, headaches, chest pain.  Denies side effects of medications.  Snacking more on chips and pretzels.  Not exercising.  Crying easily, more irritable. Started in November--stressful time in school.  She is home alone during the day, "empty nester".  Wondering if it could be hormonal--not having flashes, menses are regular, lighter than they had been.  2 sexual partners this year, not being as safe as normally she would be (unprotected).  Denies lack of sleep, or other manic/hypomanic symptoms.  Not feeling accomplished, disappointed in herself, feeling like a failure (she dropped 2 classes last semester, it was too much for her).  Currently taking only 2 classes, driving bus.  Doing better in school.  Finances are stressful, but stable.  Worries about her mother, worried that she is drinking more.  Father had stroke in December, improving.  Son just had twins, wanting to do more for them than she can.  Denies SI or HI.  ADHD:  Takes meds just during week only on days she has classes (3 days/week) and sometimes with studying.  Refilled med earlier this month.  Denies any side effects.  Anemia: Menses have been lighter recently.  Hasn't been taking her iron for about 2 weeks, taking it once daily prior to that.  She lost 4 pounds since last visit 6 months ago,  likely related to fast at church.  Past Medical History  Diagnosis Date  . Hypertension   . Obesity   . ADHD (attention deficit hyperactivity disorder)   . Anemia   . Diabetes mellitus     AODM  . Impaired fasting glucose    Past Surgical History  Procedure Date  . Uterine 10/2010    POLYP  . Knee arthroscopy 02/18/12    left (torn meniscus); Dr. Sherlean Foot   History   Social History  . Marital Status: Single    Spouse Name: N/A    Number of Children: N/A  . Years of Education: N/A   Occupational History  . Not on file.   Social History Main Topics  . Smoking status: Never Smoker   . Smokeless tobacco: Never Used  . Alcohol Use: No  . Drug Use: No  . Sexually Active: Not on file   Other Topics Concern  . Not on file   Social History Narrative   In school for business administration, as well as bus driver for [employer]   Current Outpatient Prescriptions on File Prior to Visit  Medication Sig Dispense Refill  . amphetamine-dextroamphetamine (ADDERALL) 20 MG tablet Take 2 tablets in the morning and 1 tablet in the evening, as directed  60 tablet  0  . losartan-hydrochlorothiazide (HYZAAR) 100-25 MG per tablet Take 1 tablet by mouth daily.  30 tablet  0  . metFORMIN (GLUCOPHAGE-XR) 500 MG 24 hr tablet Take 2 tablets (1,000 mg  total) by mouth daily with breakfast.  180 tablet  1  . Multiple Vitamins-Minerals (MULTIVITAMIN WITH MINERALS) tablet Take 1 tablet by mouth daily.      . Ferrous Sulfate (IRON) 325 (65 FE) MG TABS Take 1 tablet by mouth daily.        . Loratadine 10 MG CAPS Take 1 capsule by mouth daily. As needed for allergies        Allergies  Allergen Reactions  . Onglyza (Saxagliptin) Other (See Comments)    Eye irritation (when on Kombiglyze; tolerates metformin without problems)   ROS: +some hair loss/thinning, some dry scalp.  Denies fevers, URI symptoms, chest pain, headaches, dizziness.  No shortness of breath, nausea, vomiting, diarrhea.  No GI complaints, GU complaints. +depression (see screen)  PHYSICAL EXAM: BP 130/90  Pulse 60  Ht 5' 5.5" (1.664 m)  Wt 284 lb (128.822 kg)  BMI 46.54 kg/m2  LMP 10/15/2012 136/84 Very tearful during exam, but full range of affect.  Normal speech, eye contact, hygiene and grooming Neck: no lymphadenopathy, thyromegaly, carotid bruit Heart: regular rate and rhythm Lungs: clear bilaterally Abdomen: soft, nontender, no organomegaly or mass Extremities: no edema Skin: no rash Neuro: alert and oriented.  Normal strength, gait, cranial nerves  Lab Results  Component Value Date   HGBA1C 6.5 10/21/2012   ASSESSMENT/PLAN:  1. Type II or unspecified type diabetes mellitus without mention of complication, not stated as uncontrolled  HgB A1c, Comprehensive metabolic panel, TSH, metFORMIN (GLUCOPHAGE-XR) 500 MG 24 hr tablet  2. Need for prophylactic vaccination and inoculation against influenza  Flu vaccine greater than or equal to 3yo preservative free IM  3. Essential hypertension, benign  Comprehensive metabolic panel, losartan-hydrochlorothiazide (HYZAAR) 100-25 MG per tablet  4. Need for pneumococcal vaccination  Pneumococcal polysaccharide vaccine 23-valent greater than or equal to 2yo subcutaneous/IM  5. Anemia, iron deficiency  CBC with Differential, Ferritin  6. ADHD (attention deficit hyperactivity disorder)    7. Dyslipidemia  Lipid panel  8. Depressive disorder, not elsewhere classified  TSH  9. Hair loss  TSH   Depression--declines medications at this time.  Recommended counseling. Given names/numbers for therapists, but she will look into whether there is an EAP at work. To consider medications if not improving with counseling alone.  DM--controlled, despite some noncompliance with diet related to stress/depression.  Still adequately controlled on current regimen.  encouraged her to cut out Kool-Aid, eat healthier snacks.  Daily exercise and weight loss in  encouraged.  HTN--elevated today--possibly related to stress, crying.  Encouraged daily exercise, low sodium diet.  Anemia--likely improved given that menses are now lighter.  Due for recheck.  Flu shot and pneumovax given today.  F/u in 3 months, sooner prn

## 2012-11-03 ENCOUNTER — Other Ambulatory Visit: Payer: Self-pay | Admitting: Family Medicine

## 2012-12-21 ENCOUNTER — Telehealth: Payer: Self-pay | Admitting: Internal Medicine

## 2012-12-21 DIAGNOSIS — F909 Attention-deficit hyperactivity disorder, unspecified type: Secondary | ICD-10-CM

## 2012-12-21 NOTE — Telephone Encounter (Signed)
Pt needs a refill on adderall 20mg . Call pt when ready

## 2012-12-22 MED ORDER — AMPHETAMINE-DEXTROAMPHETAMINE 20 MG PO TABS: ORAL_TABLET | ORAL | Status: DC

## 2012-12-22 NOTE — Telephone Encounter (Signed)
Pt called and informed rx is ready.

## 2012-12-22 NOTE — Telephone Encounter (Signed)
Printed/signed.  Advise pt ready 

## 2013-03-30 ENCOUNTER — Encounter: Payer: Self-pay | Admitting: Family Medicine

## 2013-04-05 ENCOUNTER — Encounter: Payer: Self-pay | Admitting: Family Medicine

## 2013-04-05 ENCOUNTER — Ambulatory Visit (INDEPENDENT_AMBULATORY_CARE_PROVIDER_SITE_OTHER): Payer: BC Managed Care – PPO | Admitting: Family Medicine

## 2013-04-05 VITALS — BP 128/76 | HR 68 | Ht 65.5 in | Wt 287.0 lb

## 2013-04-05 DIAGNOSIS — N644 Mastodynia: Secondary | ICD-10-CM

## 2013-04-05 DIAGNOSIS — I10 Essential (primary) hypertension: Secondary | ICD-10-CM

## 2013-04-05 DIAGNOSIS — F988 Other specified behavioral and emotional disorders with onset usually occurring in childhood and adolescence: Secondary | ICD-10-CM

## 2013-04-05 DIAGNOSIS — D509 Iron deficiency anemia, unspecified: Secondary | ICD-10-CM

## 2013-04-05 DIAGNOSIS — E119 Type 2 diabetes mellitus without complications: Secondary | ICD-10-CM

## 2013-04-05 DIAGNOSIS — F909 Attention-deficit hyperactivity disorder, unspecified type: Secondary | ICD-10-CM

## 2013-04-05 DIAGNOSIS — F329 Major depressive disorder, single episode, unspecified: Secondary | ICD-10-CM

## 2013-04-05 LAB — GLUCOSE, RANDOM: Glucose, Bld: 99 mg/dL (ref 70–99)

## 2013-04-05 LAB — CBC WITH DIFFERENTIAL/PLATELET
Basophils Relative: 0 % (ref 0–1)
Eosinophils Absolute: 0.3 10*3/uL (ref 0.0–0.7)
Hemoglobin: 9.7 g/dL — ABNORMAL LOW (ref 12.0–15.0)
Lymphs Abs: 2 10*3/uL (ref 0.7–4.0)
MCH: 23 pg — ABNORMAL LOW (ref 26.0–34.0)
Monocytes Relative: 5 % (ref 3–12)
Neutro Abs: 4.5 10*3/uL (ref 1.7–7.7)
Neutrophils Relative %: 63 % (ref 43–77)
Platelets: 367 10*3/uL (ref 150–400)
RBC: 4.22 MIL/uL (ref 3.87–5.11)

## 2013-04-05 LAB — FERRITIN: Ferritin: 7 ng/mL — ABNORMAL LOW (ref 10–291)

## 2013-04-05 LAB — POCT GLYCOSYLATED HEMOGLOBIN (HGB A1C): Hemoglobin A1C: 6.3

## 2013-04-05 MED ORDER — AMPHETAMINE-DEXTROAMPHETAMINE 20 MG PO TABS: ORAL_TABLET | ORAL | Status: DC

## 2013-04-05 MED ORDER — LOSARTAN POTASSIUM-HCTZ 100-25 MG PO TABS: 1.0000 | ORAL_TABLET | Freq: Every day | ORAL | Status: DC

## 2013-04-05 MED ORDER — METFORMIN HCL ER 500 MG PO TB24: 1000.0000 mg | ORAL_TABLET | Freq: Every day | ORAL | Status: DC

## 2013-04-05 NOTE — Patient Instructions (Signed)
Restart counseling, exercise/walking regimen.  Restart checking sugars to be able to see improvement with improved diet/behavior changes.  Schedule annual diabetic eye exam.  Likely breast cyst.  Recheck after next menstrual cycle finishes, and if not back to baseline, to call and get orders for diagnostic mammo and ultrasound to recheck

## 2013-04-05 NOTE — Progress Notes (Signed)
Chief Complaint  Patient presents with  . Diabetes    nonfasting med check. Also has had lump in left breast x 3 years. In the last 2 weeks she has noticed an increase in size and it is painful.  Needs refill son adderall and metformin.   ADD:  Only takes meds when in school.  She is not in classes for the summer, but asking for refill for when school restarts in the fall.  She focuses better and current dose works well.  Denies any side effects.  Some mild occasional trouble sleeping related to medication.    Diabetes follow-up:  Blood sugars are not checked.  Denies hypoglycemia.  Denies polydipsia and polyuria.  Last eye exam was 04/2012. Checks feet regularly without concerns.  Her diet had been pretty good, until 2 weeks ago.  She had significant increase in stress related to parents house being in foreclosure, and deals with her stress by eating. She is drinking regular sodas, and eating candy and junk.  She was dealing with depression back in January, and got counseling which was helpful.  She is now having recurrent irritability, cursing, but hasn't been crying.  She stopped walking.  She had been walking 2 miles at least 4 days/week.  Hypertension follow-up:  Blood pressures have been normal when checked every 3 weeks at work.  Denies dizziness, headaches, chest pain.  Denies side effects of medications.  Left breast pain.  2 weeks ago she noticed increased pain and a knot that increased in size.  It is close to her nipple, and is mobile.  Her period started last Wednesday.  The severe pain has diminished, and soreness has resolved.  Still feels slightly bigger than it was before.  H/o iron deficiency anemia, felt to be related to heavy periods.  She has actually been off iron for about 3 months (simply forgot to take). Her periods have finally gotten lighter; still getting monthly.    BP 128/76  Pulse 68  Ht 5' 5.5" (1.664 m)  Wt 287 lb (130.182 kg)  BMI 47.02 kg/m2  LMP  03/30/2013  Pleasant obese female in no distress HEENT:  Conjunctiva clear. EOMI Neck: no lymphadenopathy, thyromegaly or carotid bruit Heart: regular rate and rhythm without murmur Lungs: clear bilaterally Breasts:  Right is normal.  Left side--no nipple inversion, discharge dimpling or axillary adenopathy.  There is a mobile pea-sized lesion close to the nipple at 2-3 o'clock position. 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 Abdomen: obese, soft, nontender, no organomegaly or mass Extremities: 2+ pulse, no edema.  Normal diabetic foot exam Psych: normal mood, affect, hygiene and grooming Neuro: alert and oriented.  Cranial nerves grossly intact.  Normal gait, strength, sensation   Lab Results  Component Value Date   HGBA1C 6.3 04/05/2013   ASSESSMENT/PLAN:  Type II or unspecified type diabetes mellitus without mention of complication, not stated as uncontrolled - controlled overall; recent noncompliance with diet and exercise - Plan: HgB A1c, Glucose, random, metFORMIN (GLUCOPHAGE-XR) 500 MG 24 hr tablet, HM Diabetes Foot Exam  ADD (attention deficit disorder) - stable--refill med for when classes restart in fall  Essential hypertension, benign - controlled - Plan: losartan-hydrochlorothiazide (HYZAAR) 100-25 MG per tablet  Depressive disorder, not elsewhere classified - increased stress recently.  encouraged to restart counseling  Anemia, iron deficiency - off iron currently.  periods aren't as heavy. due for recheck - Plan: CBC with  Differential, Ferritin  ADHD (attention deficit hyperactivity disorder) - Plan: amphetamine-dextroamphetamine (ADDERALL) 20 MG tablet  Breast pain, left - likely due to cyst. pain has resolved.   HTN--well controlled DM--well controlled, although recent noncompliance with diet is likely causing elevated sugars (which she isn't checking) and weight gain.  Needs to  get back on diet/exercise program as before.  Encouraged weight loss, and risks of obesity were reviewed.  Restart counseling, exercise Restart checking sugars to be able to see improvement with her diet/behavior changes.  Schedule annual diabetic eye exam.  Likely breast cyst.  Pain has already resolved, remains slightly larger than baseline.  Advised to recheck after next menstrual cycles finishes, and if not back to baseline, to call and get orders for diagnostic mammo and u/s to recheck.  Reminded of need for contraception while on ARB, briefly discussed Plan B in case condom breaks

## 2013-05-11 ENCOUNTER — Other Ambulatory Visit: Payer: Self-pay | Admitting: *Deleted

## 2013-05-11 DIAGNOSIS — D509 Iron deficiency anemia, unspecified: Secondary | ICD-10-CM

## 2013-05-16 ENCOUNTER — Telehealth: Payer: Self-pay | Admitting: Family Medicine

## 2013-05-17 NOTE — Telephone Encounter (Signed)
LM

## 2013-05-19 ENCOUNTER — Other Ambulatory Visit (INDEPENDENT_AMBULATORY_CARE_PROVIDER_SITE_OTHER): Payer: BC Managed Care – PPO

## 2013-05-19 DIAGNOSIS — D509 Iron deficiency anemia, unspecified: Secondary | ICD-10-CM

## 2013-05-19 LAB — POC HEMOCCULT BLD/STL (HOME/3-CARD/SCREEN)
Card #3 Fecal Occult Blood, POC: NEGATIVE
Fecal Occult Blood, POC: NEGATIVE

## 2013-05-23 DIAGNOSIS — E559 Vitamin D deficiency, unspecified: Secondary | ICD-10-CM

## 2013-05-23 HISTORY — DX: Vitamin D deficiency, unspecified: E55.9

## 2013-05-26 ENCOUNTER — Encounter: Payer: Self-pay | Admitting: Family Medicine

## 2013-06-07 ENCOUNTER — Other Ambulatory Visit: Payer: Self-pay | Admitting: Family Medicine

## 2013-06-07 ENCOUNTER — Other Ambulatory Visit: Payer: BC Managed Care – PPO

## 2013-06-07 DIAGNOSIS — D509 Iron deficiency anemia, unspecified: Secondary | ICD-10-CM

## 2013-06-07 LAB — CBC WITH DIFFERENTIAL/PLATELET
Basophils Absolute: 0 10*3/uL (ref 0.0–0.1)
Basophils Relative: 0 % (ref 0–1)
Hemoglobin: 10.7 g/dL — ABNORMAL LOW (ref 12.0–15.0)
MCHC: 32.1 g/dL (ref 30.0–36.0)
Monocytes Relative: 4 % (ref 3–12)
Neutro Abs: 5.7 10*3/uL (ref 1.7–7.7)
Neutrophils Relative %: 71 % (ref 43–77)
Platelets: 336 10*3/uL (ref 150–400)
RDW: 18.4 % — ABNORMAL HIGH (ref 11.5–15.5)

## 2013-06-08 LAB — FERRITIN: Ferritin: 17 ng/mL (ref 10–291)

## 2013-06-09 ENCOUNTER — Ambulatory Visit (INDEPENDENT_AMBULATORY_CARE_PROVIDER_SITE_OTHER): Payer: BC Managed Care – PPO | Admitting: Family Medicine

## 2013-06-09 VITALS — BP 130/70 | HR 72 | Ht 65.0 in | Wt 283.0 lb

## 2013-06-09 DIAGNOSIS — R5381 Other malaise: Secondary | ICD-10-CM

## 2013-06-09 DIAGNOSIS — Z23 Encounter for immunization: Secondary | ICD-10-CM

## 2013-06-09 DIAGNOSIS — F909 Attention-deficit hyperactivity disorder, unspecified type: Secondary | ICD-10-CM

## 2013-06-09 DIAGNOSIS — R51 Headache: Secondary | ICD-10-CM

## 2013-06-09 DIAGNOSIS — D509 Iron deficiency anemia, unspecified: Secondary | ICD-10-CM

## 2013-06-09 DIAGNOSIS — J309 Allergic rhinitis, unspecified: Secondary | ICD-10-CM

## 2013-06-09 MED ORDER — AMPHETAMINE-DEXTROAMPHETAMINE 20 MG PO TABS: ORAL_TABLET | ORAL | Status: DC

## 2013-06-09 MED ORDER — FLUTICASONE PROPIONATE 50 MCG/ACT NA SUSP: 2.0000 | Freq: Every day | NASAL | Status: DC

## 2013-06-09 NOTE — Progress Notes (Signed)
Chief Complaint  Patient presents with  . Fatigue    has been experiencing some fatigue lately. She has started taking her adderall when school started, and of course has noticed less fatigue but feels that it is underlying.    Patient presents for follow-up on anemia, with complaint of fatigue.  Anemia:  Taking iron once daily, tolerating without side effects, no constipation.  Denies lightheadedness or dizziness, no chest pain or shortness of breath. No blood in stools. Period was heavy this month--she thinks Adderall makes her periods heavier  Having some headaches, possibly related to allergies.  Headaches are in her cheeks, and sometimes sees streaks of blood in the mucus.  Also has headaches at her temples.  +chews a lot of gum  ADD: Taking Adderall now because she is back in school.  Has some increased trouble with sleep while on medications.  She needs to take it daily, even on weekends, because that is when she is studying.  Sometimes doesn't take it on Friday.  Sleeps better on Friday nights. She is taking 1.5 tablets in the mornings, and 1 tablet in the afternoons on days she is studying (2-3x/week).  Only takes in the mornings on the weekends  Had fatigue over the summer, even prior to starting back on Adderall. +snores, and some apnea.  She had a sleep study 12 years ago and was told she had mild sleep apnea.  Her weight was 304 then.  She is noticing some increased irritability.  Only walking once a week, on Saturdays.  Past Medical History  Diagnosis Date  . Hypertension   . Obesity   . ADHD (attention deficit hyperactivity disorder)   . Anemia   . Diabetes mellitus     AODM  . Impaired fasting glucose   . Unspecified vitamin D deficiency 05/2013   Past Surgical History  Procedure Laterality Date  . Uterine  10/2010    POLYP  . Knee arthroscopy  02/18/12    left (torn meniscus); Dr. Sherlean Foot   History   Social History  . Marital Status: Single    Spouse Name: N/A    Number of Children: N/A  . Years of Education: N/A   Occupational History  . Not on file.   Social History Main Topics  . Smoking status: Never Smoker   . Smokeless tobacco: Never Used  . Alcohol Use: No  . Drug Use: No  . Sexual Activity: Not on file   Other Topics Concern  . 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  , Disp: , Rfl: ;  Loratadine 10 MG CAPS, Take 1 capsule by mouth daily. As needed for allergies , Disp: , Rfl:  losartan-hydrochlorothiazide (HYZAAR) 100-25 MG per tablet, Take 1 tablet by mouth daily., Disp: 90 tablet, Rfl: 1;  metFORMIN (GLUCOPHAGE-XR) 500 MG 24 hr tablet, Take 2 tablets (1,000 mg total) by mouth daily with breakfast., Disp: 180 tablet, Rfl: 1;  Multiple Vitamins-Minerals (MULTIVITAMIN WITH MINERALS) tablet, Take 1 tablet by mouth daily., Disp: , Rfl:  fluticasone (FLONASE) 50 MCG/ACT nasal spray, Place 2 sprays into the nose daily., Disp: 16 g, Rfl: 6  ROS:  Denies fevers, chills, nausea, vomiting bowel changes, URI symptoms, shortness of breath, chest pain, edema, bleeding/bruising, rashes, significant depression (just some irritability).  +insomnia, fatigue, and some mild congestion and headaches.  PHYSICAL EXAM: BP 130/70  Pulse 72  Ht 5\' 5"  (1.651 m)  Wt 283 lb (128.368 kg)  BMI 47.09 kg/m2  LMP 05/22/2013 Pleasant, obese female in no distress HEENT:  PERRL, EOMI, conjunctiva clear.  Nasal mucosa with mod edema, pale/bluish. Clear mucus. Sinuses nontender. OP clear.  Mild tenderness at temporalis muscles bilaterally, temporal arteries nontender Neck: no lymphadenopathy, thyromegaly or mass Heart:  regular rate and rhythm Lungs: clear bilaterally Abdomen: obese, nontender, no mass Extremities: no edema Skin: no rash Psych: normal affect, hygiene and grooming,  Normal mood. Neuro: alert and oriented.  Cranial nerves intact.  Normal strengh, gait  ASSESSMENT/PLAN:  Other malaise and fatigue  Need for prophylactic vaccination and inoculation against influenza - Plan: Flu Vaccine QUAD 36+ mos IM  Anemia, iron deficiency  Allergic rhinitis, cause unspecified - Plan: fluticasone (FLONASE) 50 MCG/ACT nasal spray  ADHD (attention deficit hyperactivity disorder) - Plan: amphetamine-dextroamphetamine (ADDERALL) 20 MG tablet  Headache   Fatigue--component of iron deficiency anemia (improving on slow-Fe).  Continue iron, taking it BID during cycle, otherwise just once daily. Component of inadequate sleep related to use of Adderall (but fatigue preceded this, over the summer). Discussed use of stimulants, sleep hygiene, try and exercise more for stress reduction and improved sleep. H/o mild sleep apnea many years ago.  Repeat sleep study Allergies may also contribute to her fatigue (and headaches).  Continue loratidine She had normal TSH in January.  Hasn't had Vitamin D checked.-Add level to labs drawn. Headaches--sinus and tension.  Treat allergies, sinus rinses prn.  Stop chewing gum.  Tylenol or advil prn.  F/u as scheduled in January, sooner prn.  30 min visit, more than 1/2 spent counseling

## 2013-06-09 NOTE — Patient Instructions (Addendum)
Check on date and type of tetanus (Td or TdaP) and call us with this info  Double up on the iron when you are on a heavy period; otherwise continue once daily.  It is recommended that you get at least 30 minutes of aerobic exercise at least 5 days/week (for weight loss, you may need as much as 60-90 minutes). This can be any activity that gets your heart rate up. This can be divided in 10-15 minute intervals if needed, but try and build up your endurance at least once a week.  Weight bearing exercise is also recommended twice weekly.  Start flonase for allergies.  You can continue oral antihistamines (claritin).  You can use benadryl/diphenhydramine products at night to help you sleep better (and for allergies).

## 2013-06-10 ENCOUNTER — Encounter: Payer: Self-pay | Admitting: Family Medicine

## 2013-06-10 DIAGNOSIS — E559 Vitamin D deficiency, unspecified: Secondary | ICD-10-CM | POA: Insufficient documentation

## 2013-06-10 LAB — VITAMIN D 25 HYDROXY (VIT D DEFICIENCY, FRACTURES): Vit D, 25-Hydroxy: 24 ng/mL — ABNORMAL LOW (ref 30–89)

## 2013-06-13 ENCOUNTER — Other Ambulatory Visit: Payer: Self-pay | Admitting: *Deleted

## 2013-06-13 MED ORDER — ERGOCALCIFEROL 1.25 MG (50000 UT) PO CAPS: 50000.0000 [IU] | ORAL_CAPSULE | ORAL | Status: DC

## 2013-08-04 ENCOUNTER — Ambulatory Visit (INDEPENDENT_AMBULATORY_CARE_PROVIDER_SITE_OTHER): Payer: BC Managed Care – PPO | Admitting: Family Medicine

## 2013-08-04 ENCOUNTER — Encounter: Payer: Self-pay | Admitting: Family Medicine

## 2013-08-04 VITALS — BP 124/78 | HR 80 | Ht 65.0 in | Wt 281.0 lb

## 2013-08-04 DIAGNOSIS — F329 Major depressive disorder, single episode, unspecified: Secondary | ICD-10-CM

## 2013-08-04 DIAGNOSIS — M7672 Peroneal tendinitis, left leg: Secondary | ICD-10-CM

## 2013-08-04 DIAGNOSIS — G4733 Obstructive sleep apnea (adult) (pediatric): Secondary | ICD-10-CM

## 2013-08-04 DIAGNOSIS — E559 Vitamin D deficiency, unspecified: Secondary | ICD-10-CM

## 2013-08-04 DIAGNOSIS — J309 Allergic rhinitis, unspecified: Secondary | ICD-10-CM

## 2013-08-04 DIAGNOSIS — M7750 Other enthesopathy of unspecified foot: Secondary | ICD-10-CM

## 2013-08-04 MED ORDER — CITALOPRAM HYDROBROMIDE 20 MG PO TABS: 20.0000 mg | ORAL_TABLET | Freq: Every day | ORAL | Status: DC

## 2013-08-04 MED ORDER — NAPROXEN 500 MG PO TABS: 500.0000 mg | ORAL_TABLET | Freq: Two times a day (BID) | ORAL | Status: DC

## 2013-08-04 NOTE — Progress Notes (Addendum)
Chief Complaint  Patient presents with  . Headache    has been having HA x 2 weeks that comes and goes. Has seen therapist and thinks she needs some medication for depression. Also left foot has been hurting off and on x 2 days on the outside of her foot on the top portion.    She has been walking, doing a weight-loss challenge at work. She is walking 10 miles/week, line dancing.  Kids are misbehaving (lighting fire, destroying property).  Feels stressed.  Still having trouble concentrating/studying.  Asking about meditation.  She was seeing [facility] at [place], but hasn't gone recently due to $30 copay.  She thinks it is a combination of stress, seasonal affective.  She is chewing less gum, but still having headaches, feeling irritable, despite doing everything I recommended at last visit.  She reports that her crying spells are less, but still very irritable, "potty-mouth"  Headaches are across her forehead, from temple to temple.  Headaches daily for the last 2 weeks, start at work, but also gets them on the weekends.  No headaches when she was traveling.  Having some congestion this week.  She is using her Flonase an antihistamine and that seems to control her allergy symptoms.    Vitamin D deficiency--she completed rx 2 days ago, and will be heading to store to get OTC vitamin Never got set up for sleep study  Aunt with bipolar disorder.  Patient denies any manic or hypomanic symptoms.  Patient is also complaining of pain in her left foot, along the top and lateral aspect of midfoot and lateral ankle.  Pain was 8/10 yesterday.  Took advil 600mg  which helped.  Pain comes and goes, but worse over the last 2 days.  H/o twisting injury to foot 2 years ago. +increased activity as noted above (line dancing, walking).  Wears supportive shoes, only wears heels on Sundays.   Past Medical History  Diagnosis Date  . Hypertension   . Obesity   . ADHD (attention deficit hyperactivity disorder)   . Anemia    . Diabetes mellitus     AODM  . Impaired fasting glucose   . Unspecified vitamin D deficiency 05/2013   Past Surgical History  Procedure Laterality Date  . Uterine  10/2010    POLYP  . Knee arthroscopy  02/18/12    left (torn meniscus); Dr. Sherlean Foot   History   Social History  . Marital Status: Single    Spouse Name: N/A    Number of Children: N/A  . Years of Education: N/A   Occupational History  . Not on file.   Social History Main Topics  . Smoking status: Never Smoker   . Smokeless tobacco: Never Used  . Alcohol Use: No  . Drug Use: No  . Sexual Activity: Not on file   Other Topics Concern  . 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  , Disp: , Rfl: ;  fluticasone (FLONASE) 50 MCG/ACT nasal spray, Place 2 sprays into the nose daily., Disp: 16 g, Rfl: 6 Loratadine 10 MG CAPS, Take 1 capsule by mouth daily. As needed for allergies , Disp: ,  Rfl: ;  losartan-hydrochlorothiazide (HYZAAR) 100-25 MG per tablet, Take 1 tablet by mouth daily., Disp: 90 tablet, Rfl: 1;  metFORMIN (GLUCOPHAGE-XR) 500 MG 24 hr tablet, Take 2 tablets (1,000 mg total) by mouth daily with breakfast., Disp: 180 tablet, Rfl: 1 Multiple Vitamins-Minerals (MULTIVITAMIN WITH MINERALS) tablet, Take 1 tablet by mouth daily., Disp: , Rfl: ;  citalopram (CELEXA) 20 MG tablet, Take 1 tablet (20 mg total) by mouth daily., Disp: 30 tablet, Rfl: 1;  naproxen (NAPROSYN) 500 MG tablet, Take 1 tablet (500 mg total) by mouth 2 (two) times daily with a meal., Disp: 30 tablet, Rfl: 0  Allergies  Allergen Reactions  . Onglyza [Saxagliptin] Other (See Comments)    Eye irritation  (when on Kombiglyze; tolerates metformin without problems)   ROS:  Denies fevers, nausea, vomiting, bowel changes, chest pain, palpitations, numbness, tingling.  +headaches, +left foot pain.  No GI or GU complaints.  No cough, shortness of breath, wheezing, ear pain, sore throat.  PHYSICAL EXAM: BP 124/78  Pulse 80  Ht 5\' 5"  (1.651 m)  Wt 281 lb (127.461 kg)  BMI 46.76 kg/m2  LMP 07/18/2013 Very pleasant, obese female in no distress. HEENT: PERRL, EOMI, conjunctiva clear.  Nasal mucosa moderately edematous Sinuses nontender.  OP clear. Tender at bilateral temporalis muscles.  Temporal arteries nontender Neck: no lymphadenopathy, thyromegaly or mass Heart: regular rate and rhythm Lungs: clear bilaterally Extremities: Tender at L lateral foot, and inferior and anterior to lateral malleolus.  Tender at peroneal tendon insertion.  Pain with eversion against resistance  ASSESSMENT/PLAN:  Depressive disorder, not elsewhere classified - risks/side effects of SSRI's discussed in detail (how to start, how long to take, side effects, etc).  continue stress reduction techniques. - Plan: citalopram (CELEXA) 20 MG tablet  OSA (obstructive sleep apnea) - refer for sleep study - Plan: Split night study  Peroneal tendinitis, left - rest, ice/heat.  risks/side effects of NSAIDs reviewed in detail.  f/u if pain persists/worsens - Plan: naproxen (NAPROSYN) 500 MG tablet  Unspecified vitamin D deficiency - she just recently completed rx course. to start now at 1000 IU of OTC Vitamin D daily, longterm  Allergic rhinitis, cause unspecified - it appears that she is sniffing the Flonase too hard, and therefore getting limited benefit from med.  reviewed proper way to use spray  Continue exercise, weight loss  F/u 6 weeks

## 2013-08-04 NOTE — Patient Instructions (Signed)
Start the citalopram at just 1/2 tablet daily for one week.  Increase to full tablet after a week if tolerating.  If still having significant side effects, continue 1/2 tablet for longer (eventually trying to increase to full tablet, if able).  Peroneal Tendinitis with Rehab Tendonitis is inflammation of a tendon. Inflammation of the tendons on the back of the outer ankle (peroneal tendons) is known as peroneal tendonitis. The peroneal tendons are responsible for connecting the muscles that allow you to stand on your "tippy toes" to the bones of the ankle. For this reason, peroneal tendonitis often causes pain when trying to complete such motions. Peroneal tendonitis often involves a tear (strain) of the peroneal tendons. Strains are classified into three categories. Grade 1 strains cause pain, but the tendon is not lengthened. Grade 2 strains include a lengthened ligament, due to the ligament being stretched or partially ruptured. With grade 2 strains there is still function, although function may be decreased. Grade 3 strains involve a complete tear of the tendon or muscle, and function is usually impaired. SYMPTOMS   Pain, tenderness, swelling, warmth, or redness over the back of the outer side of the ankle, the outer part of the mid-foot, or the bottom of the arch.  Pain that gets worse with ankle motion (especially when pushing off or pushing down with the front of the foot), or when standing on the ball of the foot or pushing the foot outward.  Crackling sound (crepitation) when the tendon is moved or touched. CAUSES  Peroneal tendinitis occurs when injury to the peroneal tendons causes the body to respond with inflammation. Common causes of injury include:  An overuse injury, in which the groove behind the outer ankle (where the tendon is located) causes wear on the tendon.  A sudden stress placed on the tendon, such as from an increase in the intensity, frequency, or duration of  training.  Direct hit (trauma) to the tendon.  Return to activity too soon after a previous ankle injury. RISK INCREASES WITH:  Sports that require sudden, repetitive pushing off of the foot, such as jumping or quick starts.  Kicking and running sports, especially running down hills or long distances.  Poor strength and flexibility.  Previous injury to the foot, ankle, or leg. PREVENTION  Warm up and stretch properly before activity.  Allow for adequate recovery between workouts.  Maintain physical fitness:  Strength, flexibility, and endurance.  Cardiovascular fitness.  Complete rehabilitation after previous injury. PROGNOSIS  If treated properly, peroneal tendonitis usually heals within 6 weeks.  RELATED COMPLICATIONS  Longer healing time, if not properly treated or if not given enough time to heal.  Recurring symptoms, if activity is resumed too soon, with overuse, or when using poor technique.  If untreated, tendinitis may result in tendon rupture, requiring surgery. TREATMENT  Treatment first involves the use of ice and medicine, to reduce pain and inflammation. The use of strengthening and stretching exercises may help reduce pain with activity. These exercises may be performed at home or with a therapist. Sometimes, the foot and ankle will be restrained for 10 to 14 days to promote healing. Your caregiver may advise that you place a heel lift in your shoes to reduce the stress placed on the tendon. If non-surgical treatment is unsuccessful, surgery to remove the inflamed tendon lining (sheath) may be advised.  MEDICATION   If pain medicine is needed, nonsteroidal anti-inflammatory medicines (aspirin and ibuprofen), or other minor pain relievers (acetaminophen), are often advised.  Do not take pain medicine for 7 days before surgery.  Prescription pain relievers may be given, if your caregiver thinks they are needed. Use only as directed and only as much as you  need. HEAT AND COLD  Cold treatment (icing) should be applied for 10 to 15 minutes every 2 to 3 hours for inflammation and pain, and immediately after activity that aggravates your symptoms. Use ice packs or an ice massage.  Heat treatment may be used before performing stretching and strengthening activities prescribed by your caregiver, physical therapist, or athletic trainer. Use a heat pack or a warm water soak. SEEK MEDICAL CARE IF:  Symptoms get worse or do not improve in 2 to 4 weeks, despite treatment.  New, unexplained symptoms develop. (Drugs used in treatment may produce side effects.) EXERCISES RANGE OF MOTION (ROM) AND STRETCHING EXERCISES - Peroneal Tendinitis These exercises may help you when beginning to rehabilitate your injury. Your symptoms may resolve with or without further involvement from your physician, physical therapist or athletic trainer. While completing these exercises, remember:   Restoring tissue flexibility helps normal motion to return to the joints. This allows healthier, less painful movement and activity.  An effective stretch should be held for at least 30 seconds.  A stretch should never be painful. You should only feel a gentle lengthening or release in the stretched tissue. RANGE OF MOTION - Ankle Eversion  Sit with your right / left ankle crossed over your opposite knee.  Grip your foot with your opposite hand, placing your thumb on the top of your foot and your fingers across the bottom of your foot.  Gently push your foot downward with a slight rotation, so your littlest toes rise slightly toward the ceiling.  You should feel a gentle stretch on the inside of your ankle. Hold the stretch for __________ seconds. Repeat __________ times. Complete this exercise __________ times per day.  RANGE OF MOTION - Ankle Inversion  Sit with your right / left ankle crossed over your opposite knee.  Grip your foot with your opposite hand, placing your  thumb on the bottom of your foot and your fingers across the top of your foot.  Gently pull your foot so the smallest toe comes toward you and your thumb pushes the inside of the ball of your foot away from you.  You should feel a gentle stretch on the outside of your ankle. Hold the stretch for __________ seconds. Repeat __________ times. Complete this exercise __________ times per day.  RANGE OF MOTION - Ankle Plantar Flexion  Sit with your right / left leg crossed over your opposite knee.  Use your opposite hand to pull the top of your foot and toes toward you.  You should feel a gentle stretch on the top of your foot and ankle. Hold this position for __________ seconds. Repeat __________ times. Complete __________ times per day.  STRETCH  Gastroc, Standing  Place your hands on a wall.  Extend your right / left leg behind you, keeping the front knee somewhat bent.  Slightly point your toes inward on your back foot.  Keeping your right / left heel on the floor and your knee straight, shift your weight toward the wall, not allowing your back to arch.  You should feel a gentle stretch in the calf. Hold this position for __________ seconds. Repeat __________ times. Complete this stretch __________ times per day. STRETCH  Soleus, Standing  Place your hands on a wall.  Extend your right /  left leg behind you, keeping the other knee somewhat bent.  Slightly point your toes inward on your back foot.  Keep your heel on the floor, bend your back knee, and slightly shift your weight over the back leg so that you feel a gentle stretch deep in your back calf.  Hold this position for __________ seconds. Repeat __________ times. Complete this stretch __________ times per day. STRETCH  Gastrocsoleus, Standing Note: This exercise can place a lot of stress on your foot and ankle. Please complete this exercise only if specifically instructed by your caregiver.   Place the ball of your right  / left foot on a step, keeping your other foot firmly on the same step.  Hold on to the wall or a rail for balance.  Slowly lift your other foot, allowing your body weight to press your heel down over the edge of the step.  You should feel a stretch in your right / left calf.  Hold this position for __________ seconds.  Repeat this exercise with a slight bend in your knee. Repeat __________ times. Complete this stretch __________ times per day.  STRENGTHENING EXERCISES - Peroneal Tendinitis  These exercises may help you when beginning to rehabilitate your injury. They may resolve your symptoms with or without further involvement from your physician, physical therapist or athletic trainer. While completing these exercises, remember:   Muscles can gain both the endurance and the strength needed for everyday activities through controlled exercises.  Complete these exercises as instructed by your physician, physical therapist or athletic trainer. Increase the resistance and repetitions only as guided by your caregiver. STRENGTH - Dorsiflexors  Secure a rubber exercise band or tubing to a fixed object (table, pole) and loop the other end around your right / left foot.  Sit on the floor facing the fixed object. The band should be slightly tense when your foot is relaxed.  Slowly draw your foot back toward you, using your ankle and toes.  Hold this position for __________ seconds. Slowly release the tension in the band and return your foot to the starting position. Repeat __________ times. Complete this exercise __________ times per day.  STRENGTH - Towel Curls  Sit in a chair, on a non-carpeted surface.  Place your foot on a towel, keeping your heel on the floor.  Pull the towel toward your heel only by curling your toes. Keep your heel on the floor.  If instructed by your physician, physical therapist or athletic trainer, add weight to the end of the towel. Repeat __________ times.  Complete this exercise __________ times per day. STRENGTH - Ankle Eversion   Secure one end of a rubber exercise band or tubing to a fixed object (table, pole). Loop the other end around your foot, just before your toes.  Place your fists between your knees. This will focus your strengthening at your ankle.  Drawing the band across your opposite foot, away from the pole, slowly, pull your little toe out and up. Make sure the band is positioned to resist the entire motion.  Hold this position for __________ seconds.  Have your muscles resist the band, as it slowly pulls your foot back to the starting position. Repeat __________ times. Complete this exercise __________ times per day.  Document Released: 09/08/2005 Document Revised: 12/01/2011 Document Reviewed: 12/21/2008 [facility] Patient Information 2014 [place], [place].  Repeat all exercises 5-10 times, holding for 15 seconds, repeating twice daily

## 2013-09-19 ENCOUNTER — Ambulatory Visit: Payer: Self-pay | Admitting: Family Medicine

## 2013-09-20 ENCOUNTER — Emergency Department (HOSPITAL_COMMUNITY)
Admission: EM | Admit: 2013-09-20 | Discharge: 2013-09-20 | Disposition: A | Discharge status: Home / Self Care | Payer: BC Managed Care – PPO | Attending: Emergency Medicine | Admitting: Emergency Medicine

## 2013-09-20 ENCOUNTER — Encounter (HOSPITAL_COMMUNITY): Payer: Self-pay | Admitting: Emergency Medicine

## 2013-09-20 ENCOUNTER — Emergency Department (HOSPITAL_COMMUNITY): Payer: BC Managed Care – PPO

## 2013-09-20 DIAGNOSIS — IMO0002 Reserved for concepts with insufficient information to code with codable children: Secondary | ICD-10-CM | POA: Insufficient documentation

## 2013-09-20 DIAGNOSIS — F909 Attention-deficit hyperactivity disorder, unspecified type: Secondary | ICD-10-CM | POA: Insufficient documentation

## 2013-09-20 DIAGNOSIS — Z79899 Other long term (current) drug therapy: Secondary | ICD-10-CM | POA: Insufficient documentation

## 2013-09-20 DIAGNOSIS — S61209A Unspecified open wound of unspecified finger without damage to nail, initial encounter: Secondary | ICD-10-CM | POA: Insufficient documentation

## 2013-09-20 DIAGNOSIS — Y929 Unspecified place or not applicable: Secondary | ICD-10-CM | POA: Insufficient documentation

## 2013-09-20 DIAGNOSIS — Z23 Encounter for immunization: Secondary | ICD-10-CM | POA: Insufficient documentation

## 2013-09-20 DIAGNOSIS — W260XXA Contact with knife, initial encounter: Secondary | ICD-10-CM | POA: Insufficient documentation

## 2013-09-20 DIAGNOSIS — S61219A Laceration without foreign body of unspecified finger without damage to nail, initial encounter: Secondary | ICD-10-CM

## 2013-09-20 DIAGNOSIS — D649 Anemia, unspecified: Secondary | ICD-10-CM | POA: Insufficient documentation

## 2013-09-20 DIAGNOSIS — E119 Type 2 diabetes mellitus without complications: Secondary | ICD-10-CM | POA: Insufficient documentation

## 2013-09-20 DIAGNOSIS — Y939 Activity, unspecified: Secondary | ICD-10-CM | POA: Insufficient documentation

## 2013-09-20 DIAGNOSIS — I10 Essential (primary) hypertension: Secondary | ICD-10-CM | POA: Insufficient documentation

## 2013-09-20 DIAGNOSIS — E669 Obesity, unspecified: Secondary | ICD-10-CM | POA: Insufficient documentation

## 2013-09-20 MED ORDER — IBUPROFEN 400 MG PO TABS
800.0000 mg | ORAL_TABLET | Freq: Once | ORAL | Status: AC
  Administered 2013-09-20: 800 mg via ORAL
  Filled 2013-09-20: qty 2

## 2013-09-20 MED ORDER — TETANUS-DIPHTH-ACELL PERTUSSIS 5-2.5-18.5 LF-MCG/0.5 IM SUSP
0.5000 mL | Freq: Once | INTRAMUSCULAR | Status: AC
  Administered 2013-09-20: 0.5 mL via INTRAMUSCULAR
  Filled 2013-09-20: qty 0.5

## 2013-09-20 NOTE — ED Notes (Addendum)
Pt c/o left 4th digit laceration at tip on finger through nail bed with knife; bleeding controlled with bandage

## 2013-09-20 NOTE — ED Notes (Signed)
Suture cart to the bedside.  

## 2013-09-20 NOTE — ED Provider Notes (Signed)
CSN: [id number]     Arrival date & time 09/20/13  0900 History   First MD Initiated Contact with Patient 09/20/13 9562    This chart was scribed for Arthor Captain PA-C, a non-physician practitioner working with Benny Lennert, MD by Lewanda Rife, ED Scribe. This patient was seen in room TR11C/TR11C and the patient's care was started at 11:33 AM     Chief Complaint  Patient presents with  . Finger Injury   (Consider location/radiation/quality/duration/timing/severity/associated sxs/prior Treatment) The history is provided by the patient. No language interpreter was used.   HPI Comments: Crystal [name] is a 47 y.o. female brought in by ambulance, who presents to the Emergency Department with PMHx DM complaining of laceration of left distal 4th digit through nail bed with knife onset PTA. Reports pain was resolved with 800 mg ibuprofen given in ED, but was 7/10 in severity at its worst. Describes pain as throbbing. Reports pain is exacerbated by touch and alleviated by ibuprofen. Denies PMHx of bleeding disorder. Tetanus status is not up to date.  Past Medical History  Diagnosis Date  . Hypertension   . Obesity   . ADHD (attention deficit hyperactivity disorder)   . Anemia   . Diabetes mellitus     AODM  . Impaired fasting glucose   . Unspecified vitamin D deficiency 05/2013   Past Surgical History  Procedure Laterality Date  . Uterine  10/2010    POLYP  . Knee arthroscopy  02/18/12    left (torn meniscus); Dr. Sherlean Foot   Family History  Problem Relation Age of Onset  . Depression Mother   . Stroke Father    History  Substance Use Topics  . Smoking status: Never Smoker   . Smokeless tobacco: Never Used  . Alcohol Use: No   OB History   Grav Para Term Preterm Abortions TAB SAB Ect Mult Living                 Review of Systems  Constitutional: Negative for fever.  Skin: Positive for wound.  Psychiatric/Behavioral: Negative for confusion.   A complete 10 system  review of systems was obtained and all systems are negative except as noted in the HPI and PMHx.    Allergies  Onglyza  Home Medications   Current Outpatient Rx  Name  Route  Sig  Dispense  Refill  . amphetamine-dextroamphetamine (ADDERALL) 20 MG tablet   Oral   Take 20 mg by mouth See admin instructions. Take 2 tablets in the morning and 1 tablet in the evening         . Ferrous Sulfate (IRON) 325 (65 FE) MG TABS   Oral   Take 1 tablet by mouth daily.          . fluticasone (FLONASE) 50 MCG/ACT nasal spray   Nasal   Place 2 sprays into the nose daily.   16 g   6   . ibuprofen (ADVIL,MOTRIN) 200 MG tablet   Oral   Take 200 mg by mouth every 6 (six) hours as needed for mild pain.         Marland Kitchen losartan-hydrochlorothiazide (HYZAAR) 100-25 MG per tablet   Oral   Take 1 tablet by mouth daily.   90 tablet   1   . metFORMIN (GLUCOPHAGE-XR) 500 MG 24 hr tablet   Oral   Take 2 tablets (1,000 mg total) by mouth daily with breakfast.   180 tablet   1   . Multiple Vitamins-Minerals (  MULTIVITAMIN WITH MINERALS) tablet   Oral   Take 1 tablet by mouth daily.          BP 181/86  Pulse 83  Temp(Src) 98.4 F (36.9 C) (Oral)  Resp 18  SpO2 98% Physical Exam  Nursing note and vitals reviewed. Constitutional: She is oriented to person, place, and time. She appears well-developed and well-nourished. No distress.  HENT:  Head: Normocephalic and atraumatic.  Eyes: EOM are normal.  Neck: Neck supple. No tracheal deviation present.  Cardiovascular: Normal rate.   Pulmonary/Chest: Effort normal. No respiratory distress.  Musculoskeletal: Normal range of motion.  Neurological: She is alert and oriented to person, place, and time.  Skin: Skin is warm and dry.  Psychiatric: She has a normal mood and affect. Her behavior is normal.    ED Course  Procedures  COORDINATION OF CARE:  Nursing notes reviewed. Vital signs reviewed. Initial pt interview and examination performed.    11:34 AM-Discussed work up plan with pt at bedside, which includes x-ray of left ring finger. Pt agrees with plan.  11:42 AM Nursing Notes Reviewed/ Care Coordinated Applicable Imaging Reviewed and incorporated into ED treatment Discussed results and treatment plan with pt. Pt demonstrates understanding and agrees with plan.  LACERATION REPAIR Performed by: Arthor Captain PA-C Consent: Verbal consent obtained. Risks and benefits: risks, benefits and alternatives were discussed Patient identity confirmed: provided demographic data Time out performed prior to procedure Prepped and Draped in normal sterile fashion Wound explored Laceration Location: left distal ring finger with nailbed involvement  Laceration Length: 3 cm with nailbed involvement (partial involvement) of the distal tip of left 4th digit  No Foreign Bodies seen or palpated Anesthesia: local infiltration Local anesthetic: lidocaine 2% with epinephrine Anesthetic total: 10 ml Irrigation method: syringe Amount of cleaning: standard Skin closure: 5-0 Vicryl  Number of sutures or staples: 8 sutures  Technique: simple interrupted  Patient tolerance: Patient tolerated the procedure well with no immediate complications.   Treatment plan initiated:Medications - No data to display   Initial diagnostic testing ordered.    Labs Review Labs Reviewed - No data to display Imaging Review Dg Finger Ring Left  09/20/2013   CLINICAL DATA:  Sliced with knife.  Pain.  EXAM: LEFT RING FINGER 2+V  COMPARISON:  None.  FINDINGS: There is no evidence of fracture or dislocation. There is no evidence of arthropathy or other focal bone abnormality. Soft tissue swelling ventrally. No foreign body.  IMPRESSION: Negative for osseous injury.   Electronically Signed   By: Davonna Belling M.D.   On: 09/20/2013 09:54    EKG Interpretation   None       MDM   1. Finger laceration, initial encounter    Tdap booster given.Pressure irrigation  performed. Laceration occurred < 8 hours prior to repair which was well tolerated. Pt has no co morbidities to effect normal wound healing. Discussed suture home care w pt and answered questions. Pt to f-u for wound check with hand specialist this week. Discussed reasons to seek immediate medical care including signs of infection or necrosis. Pt is hemodynamically stable w no complaints prior to dc.     I personally performed the services described in this documentation, which was scribed in my presence. The recorded information has been reviewed and is accurate.     Arthor Captain, PA-C 09/20/13 [phone]

## 2013-09-21 NOTE — ED Provider Notes (Signed)
Medical screening examination/treatment/procedure(s) were performed by non-physician practitioner and as supervising physician I was immediately available for consultation/collaboration.  EKG Interpretation   None         Benny Lennert, MD 09/21/13 1450

## 2013-09-23 ENCOUNTER — Ambulatory Visit (HOSPITAL_BASED_OUTPATIENT_CLINIC_OR_DEPARTMENT_OTHER): Payer: BC Managed Care – PPO | Attending: Family Medicine

## 2013-09-23 VITALS — Ht 65.0 in | Wt 276.0 lb

## 2013-09-23 DIAGNOSIS — G4710 Hypersomnia, unspecified: Secondary | ICD-10-CM | POA: Insufficient documentation

## 2013-09-23 DIAGNOSIS — R0989 Other specified symptoms and signs involving the circulatory and respiratory systems: Secondary | ICD-10-CM | POA: Insufficient documentation

## 2013-09-23 DIAGNOSIS — G4733 Obstructive sleep apnea (adult) (pediatric): Secondary | ICD-10-CM

## 2013-09-23 DIAGNOSIS — R0609 Other forms of dyspnea: Secondary | ICD-10-CM | POA: Insufficient documentation

## 2013-09-23 DIAGNOSIS — G4730 Sleep apnea, unspecified: Principal | ICD-10-CM

## 2013-09-24 DIAGNOSIS — G4733 Obstructive sleep apnea (adult) (pediatric): Secondary | ICD-10-CM

## 2013-09-24 NOTE — Sleep Study (Signed)
   NAME: Crystal [name] DATE OF BIRTH:  [birth year] MEDICAL RECORD NUMBER [id number]  LOCATION: [facility]  PHYSICIAN: YOUNG,CLINTON D  DATE OF STUDY: 09/23/2013  SLEEP STUDY TYPE: Nocturnal Polysomnogram               REFERRING PHYSICIAN: Rita Ohara, MD  INDICATION FOR STUDY: Hypersomnia with sleep apnea  EPWORTH SLEEPINESS SCORE:   18/24 HEIGHT: 5\' 5"  (165.1 cm)  WEIGHT: 276 lb (125.193 kg)    Body mass index is 45.93 kg/(m^2).  NECK SIZE: 15 in.  MEDICATIONS: Charted for review  SLEEP ARCHITECTURE: Total sleep time 370.5 minutes with sleep efficiency 93.4%. Stage I was 2.6%, stage II 86%, stage III absent, REM 11.5% of total sleep time. Sleep latency 2 minutes, REM latency 60 minutes, awake after sleep onset 24 minutes, arousal index 3.7. Bedtime medication: None  RESPIRATORY DATA: Apnea hypopneas index (AHI) 2.8 per hour. A total of 17 events was scored including 3 central apneas, 14 hypopneas. Most events occurred while sleeping supine. REM AHI 0. There were not enough early events to permit application of split CPAP titration protocol  OXYGEN DATA: Loud snoring with oxygen desaturation to a nadir of 83% and mean oxygen saturation through the study of 93.5% on room air.  CARDIAC DATA: Normal sinus rhythm  MOVEMENT/PARASOMNIA: No significant movement disturbance. Bathroom x2  IMPRESSION/ RECOMMENDATION:   1) Occasional respiratory events with sleep disturbance, within normal limits. AHI 2.8 per hour (the normal range for adults is an AHI from 0-5 events per hour). Loud snoring with oxygen desaturation to a nadir of 83% and mean oxygen saturation through the study of 93.5% on room air.  Signed Baird Lyons M.D. Deneise Lever Diplomate, American Board of Sleep Medicine  ELECTRONICALLY SIGNED ON:  09/24/2013, 11:40 AM [place] PH: [phone]   FX: [phone] [place]

## 2013-09-26 ENCOUNTER — Other Ambulatory Visit: Payer: Self-pay

## 2013-09-26 DIAGNOSIS — Z1231 Encounter for screening mammogram for malignant neoplasm of breast: Secondary | ICD-10-CM

## 2013-10-06 ENCOUNTER — Other Ambulatory Visit: Payer: Self-pay | Admitting: Family Medicine

## 2013-10-10 ENCOUNTER — Ambulatory Visit (INDEPENDENT_AMBULATORY_CARE_PROVIDER_SITE_OTHER): Payer: BC Managed Care – PPO | Admitting: Family Medicine

## 2013-10-10 ENCOUNTER — Encounter: Payer: Self-pay | Admitting: Family Medicine

## 2013-10-10 VITALS — BP 128/78 | HR 68 | Ht 65.0 in | Wt 276.0 lb

## 2013-10-10 DIAGNOSIS — I10 Essential (primary) hypertension: Secondary | ICD-10-CM

## 2013-10-10 DIAGNOSIS — E785 Hyperlipidemia, unspecified: Secondary | ICD-10-CM

## 2013-10-10 DIAGNOSIS — D509 Iron deficiency anemia, unspecified: Secondary | ICD-10-CM

## 2013-10-10 DIAGNOSIS — F3289 Other specified depressive episodes: Secondary | ICD-10-CM

## 2013-10-10 DIAGNOSIS — F329 Major depressive disorder, single episode, unspecified: Secondary | ICD-10-CM

## 2013-10-10 DIAGNOSIS — E559 Vitamin D deficiency, unspecified: Secondary | ICD-10-CM

## 2013-10-10 DIAGNOSIS — E119 Type 2 diabetes mellitus without complications: Secondary | ICD-10-CM

## 2013-10-10 DIAGNOSIS — F988 Other specified behavioral and emotional disorders with onset usually occurring in childhood and adolescence: Secondary | ICD-10-CM

## 2013-10-10 LAB — POCT GLYCOSYLATED HEMOGLOBIN (HGB A1C): HEMOGLOBIN A1C: 6.4

## 2013-10-10 LAB — CBC WITH DIFFERENTIAL/PLATELET
BASOS ABS: 0 10*3/uL (ref 0.0–0.1)
Basophils Relative: 0 % (ref 0–1)
EOS ABS: 0.2 10*3/uL (ref 0.0–0.7)
Eosinophils Relative: 2 % (ref 0–5)
HCT: 33.3 % — ABNORMAL LOW (ref 36.0–46.0)
Hemoglobin: 10.7 g/dL — ABNORMAL LOW (ref 12.0–15.0)
LYMPHS ABS: 1.7 10*3/uL (ref 0.7–4.0)
Lymphocytes Relative: 23 % (ref 12–46)
MCH: 24.3 pg — ABNORMAL LOW (ref 26.0–34.0)
MCHC: 32.1 g/dL (ref 30.0–36.0)
MCV: 75.5 fL — ABNORMAL LOW (ref 78.0–100.0)
Monocytes Absolute: 0.4 10*3/uL (ref 0.1–1.0)
Monocytes Relative: 6 % (ref 3–12)
NEUTROS PCT: 69 % (ref 43–77)
Neutro Abs: 5 10*3/uL (ref 1.7–7.7)
PLATELETS: 345 10*3/uL (ref 150–400)
RBC: 4.41 MIL/uL (ref 3.87–5.11)
RDW: 16.3 % — ABNORMAL HIGH (ref 11.5–15.5)
WBC: 7.3 10*3/uL (ref 4.0–10.5)

## 2013-10-10 LAB — COMPREHENSIVE METABOLIC PANEL
ALBUMIN: 3.7 g/dL (ref 3.5–5.2)
ALK PHOS: 59 U/L (ref 39–117)
ALT: 10 U/L (ref 0–35)
AST: 11 U/L (ref 0–37)
BUN: 11 mg/dL (ref 6–23)
CO2: 28 meq/L (ref 19–32)
Calcium: 8.8 mg/dL (ref 8.4–10.5)
Chloride: 101 mEq/L (ref 96–112)
Creat: 0.73 mg/dL (ref 0.50–1.10)
GLUCOSE: 108 mg/dL — AB (ref 70–99)
POTASSIUM: 3.5 meq/L (ref 3.5–5.3)
SODIUM: 136 meq/L (ref 135–145)
TOTAL PROTEIN: 7 g/dL (ref 6.0–8.3)
Total Bilirubin: 0.3 mg/dL (ref 0.3–1.2)

## 2013-10-10 LAB — LIPID PANEL
CHOL/HDL RATIO: 4 ratio
Cholesterol: 132 mg/dL (ref 0–200)
HDL: 33 mg/dL — ABNORMAL LOW (ref >39)
LDL Cholesterol: 81 mg/dL (ref 0–99)
Triglycerides: 89 mg/dL (ref <150)
VLDL: 18 mg/dL (ref 0–40)

## 2013-10-10 MED ORDER — AMPHETAMINE-DEXTROAMPHETAMINE 20 MG PO TABS: 20.0000 mg | ORAL_TABLET | ORAL | Status: DC

## 2013-10-10 MED ORDER — METFORMIN HCL ER 500 MG PO TB24
1000.0000 mg | ORAL_TABLET | Freq: Every day | ORAL | Status: DC
Start: 2013-10-10 — End: 2014-04-03

## 2013-10-10 MED ORDER — LOSARTAN POTASSIUM-HCTZ 100-25 MG PO TABS: 1.0000 | ORAL_TABLET | Freq: Every day | ORAL | Status: DC

## 2013-10-10 NOTE — Progress Notes (Signed)
Chief Complaint  Patient presents with  . Diabetes    fasting med check. Was given rx for citalopram 20mg  at last OV, took for one day and did not like the way she felt.    Diabetes follow-up: Blood sugars are not checked. Denies hypoglycemia. Denies polydipsia and polyuria. Last eye exam was 04/2013 with optometrist, and has an appointment scheduled with Dr. Herbert Deaner soon for full annual diabetic exam. Checks feet regularly without concerns. She is still doing a challenge at work, diet was good except through the holidays.  Plans to do a fast through church soon (eats more vegetables, no sodas, less beef).  She dropped her classes for the rest of the summer and she felt much better.  She tried the citalopram at 1/2 tablet for just 2 days, and she felt anxious, nervous, breaking out in sweats.  She then dropped the classes, felt better, and so didn't restart the citalopram.  She is only taking 1 class now.  Things are resolved with her parents house; kids (on bus) are being better.  Headaches aren't as often.     Hypertension follow-up: Blood pressures aren't being checked (due to holidays, not checking at work). Denies dizziness, headaches (less often), chest pain. Denies side effects of medications.  ADD:  She is only taking 1 class now.  She takes it Sunday, Monday, Tues and Wed (not taking it the other days, because no classes or studying on those days).  Taking 1.5 tablets in the morning.  She hasn't needed the pm dose so far, with her only having 1 class (which only started a couple of weeks ago), and it affecting sleep somewhat.   Anemia: Taking iron once daily, tolerating without side effects, no constipation. Denies lightheadedness or dizziness, no chest pain or shortness of breath. No blood in stools.  Periods are regular, and not as heavy as they have been in the past.  Seems to alternate, having heavier months only every other month.  She had a sleep study earlier this month. IMPRESSION/  RECOMMENDATION:  1) Occasional respiratory events with sleep disturbance, within normal limits. AHI 2.8 per hour (the normal range for adults is an AHI from 0-5 events per hour). Loud snoring with oxygen desaturation to a nadir of 83% and mean oxygen saturation through the study of 93.5% on room air.  Pain in left foot, diagnosed at peroneal tendinitis at her last visit in September.  She is doing better overall.  She saw a specialist, who recommended physical therapy.  She had been putting off starting until the [place] Year due to deductible.  She is wearing a brace, and pain is improved (but not gone).  Recent laceration to L ring finger, and she recently saw hand surgeon in follow-up.  Current outpatient prescriptions:amphetamine-dextroamphetamine (ADDERALL) 20 MG tablet, Take 20 mg by mouth See admin instructions. Take 2 tablets in the morning and 1 tablet in the evening, Disp: , Rfl: ;  Ferrous Sulfate (IRON) 325 (65 FE) MG TABS, Take 1 tablet by mouth daily. , Disp: , Rfl: ;  fluticasone (FLONASE) 50 MCG/ACT nasal spray, Place 2 sprays into the nose daily., Disp: 16 g, Rfl: 6 losartan-hydrochlorothiazide (HYZAAR) 100-25 MG per tablet, Take 1 tablet by mouth daily., Disp: 90 tablet, Rfl: 1;  metFORMIN (GLUCOPHAGE-XR) 500 MG 24 hr tablet, Take 2 tablets (1,000 mg total) by mouth daily with breakfast., Disp: 180 tablet, Rfl: 1;  Multiple Vitamins-Minerals (MULTIVITAMIN WITH MINERALS) tablet, Take 1 tablet by mouth daily., Disp: ,  Rfl:  ibuprofen (ADVIL,MOTRIN) 200 MG tablet, Take 200 mg by mouth every 6 (six) hours as needed for mild pain., Disp: , Rfl:   Allergies  Allergen Reactions  . Onglyza [Saxagliptin] Other (See Comments)    Eye irritation (when on Kombiglyze; tolerates metformin without problems)   ROS:  Denies fevers, URI symptoms, chest pain, dizziness, shortness of breath, nausea, vomiting, diarrhea, constipation.  Denies skin rashes, bleeding, bruising. No urinary complaints.  Lost about  10 pounds in the last 6 months (intentional)   BP 128/78  Pulse 68  Ht 5\' 5"  (1.651 m)  Wt 276 lb (125.193 kg)  BMI 45.93 kg/m2  LMP 10/07/2012 Pleasant obese female in no distress  HEENT: Conjunctiva clear. EOMI  Neck: no lymphadenopathy, thyromegaly or carotid bruit  Heart: regular rate and rhythm without murmur  Lungs: clear bilaterally  Abdomen: obese, soft, nontender, no organomegaly or mass  Extremities: 2+ pulse, no edema. Normal diabetic foot exam  Psych: normal mood, affect, hygiene and grooming  Neuro: alert and oriented. Cranial nerves grossly intact. Normal gait, strength, sensation   a1c 6.4  ASSESSMENT/PLAN:  Essential hypertension, benign - controlled - Plan: Comprehensive metabolic panel, losartan-hydrochlorothiazide (HYZAAR) 100-25 MG per tablet  Type II or unspecified type diabetes mellitus without mention of complication, not stated as uncontrolled - controlled - Plan: HgB A1c, HM Diabetes Foot Exam, Comprehensive metabolic panel, TSH, Microalbumin / creatinine urine ratio, metFORMIN (GLUCOPHAGE-XR) 500 MG 24 hr tablet  ADD (attention deficit disorder) - controlled--needing less medication due to fewer classes currently - Plan: amphetamine-dextroamphetamine (ADDERALL) 20 MG tablet  Depressive disorder, not elsewhere classified - improved/resolved with improved stressors  Unspecified vitamin D deficiency - Plan: Vit D  25 hydroxy (rtn osteoporosis monitoring)  Dyslipidemia - Plan: Lipid panel  Anemia, iron deficiency - Plan: CBC with Differential, Ferritin  Pt is on menses--if urine microalbumin is abnormal, just repeat  Depression: likely has seasonal component.  Stressors are also significantly improved at this point. If develops worsening symptoms again, then restart counseling and consider restarting medication.  Likely would do better with Wellbutrin (not discussed in detail) rather than restarting citalopram.  F/u--6 months fasting med check

## 2013-10-10 NOTE — Patient Instructions (Signed)
Keep up your exercise and weight loss.  Consider sticking with many of the church's "fast" healthy diet changes more longterm. Continue your current medications. If moods worsen, restart counseling and if still having trouble return to discuss other medications.  You are at higher risk of this occuring next winter (with the shorter days) but can be anytime if stressors worsen again.

## 2013-10-11 LAB — FERRITIN: Ferritin: 12 ng/mL (ref 10–291)

## 2013-10-11 LAB — VITAMIN D 25 HYDROXY (VIT D DEFICIENCY, FRACTURES): VIT D 25 HYDROXY: 33 ng/mL (ref 30–89)

## 2013-10-11 LAB — TSH: TSH: 1.2 u[IU]/mL (ref 0.350–4.500)

## 2013-10-19 ENCOUNTER — Ambulatory Visit
Admission: RE | Admit: 2013-10-19 | Discharge: 2013-10-19 | Disposition: A | Discharge status: Home / Self Care | Payer: BC Managed Care – PPO | Source: Ambulatory Visit

## 2013-10-19 DIAGNOSIS — Z1231 Encounter for screening mammogram for malignant neoplasm of breast: Secondary | ICD-10-CM

## 2013-10-20 ENCOUNTER — Other Ambulatory Visit: Payer: Self-pay | Admitting: Family Medicine

## 2013-10-20 DIAGNOSIS — R928 Other abnormal and inconclusive findings on diagnostic imaging of breast: Secondary | ICD-10-CM

## 2013-10-30 DIAGNOSIS — Z0279 Encounter for issue of other medical certificate: Secondary | ICD-10-CM

## 2013-10-31 ENCOUNTER — Other Ambulatory Visit: Payer: Self-pay | Admitting: Family Medicine

## 2013-10-31 ENCOUNTER — Ambulatory Visit
Admission: RE | Admit: 2013-10-31 | Discharge: 2013-10-31 | Disposition: A | Discharge status: Home / Self Care | Payer: BC Managed Care – PPO | Source: Ambulatory Visit | Attending: Family Medicine | Admitting: Family Medicine

## 2013-10-31 ENCOUNTER — Ambulatory Visit
Admission: RE | Admit: 2013-10-31 | Discharge: 2013-10-31 | Disposition: A | Discharge status: Home / Self Care | Payer: Self-pay | Source: Ambulatory Visit | Attending: Family Medicine | Admitting: Family Medicine

## 2013-10-31 DIAGNOSIS — R928 Other abnormal and inconclusive findings on diagnostic imaging of breast: Secondary | ICD-10-CM

## 2013-10-31 DIAGNOSIS — N6320 Unspecified lump in the left breast, unspecified quadrant: Secondary | ICD-10-CM

## 2013-11-07 ENCOUNTER — Ambulatory Visit
Admission: RE | Admit: 2013-11-07 | Discharge: 2013-11-07 | Disposition: A | Discharge status: Home / Self Care | Payer: BC Managed Care – PPO | Source: Ambulatory Visit | Attending: Family Medicine | Admitting: Family Medicine

## 2013-11-07 ENCOUNTER — Other Ambulatory Visit: Payer: Self-pay | Admitting: Family Medicine

## 2013-11-07 ENCOUNTER — Telehealth: Payer: Self-pay | Admitting: Family Medicine

## 2013-11-07 DIAGNOSIS — N6320 Unspecified lump in the left breast, unspecified quadrant: Secondary | ICD-10-CM

## 2013-11-07 NOTE — Telephone Encounter (Signed)
I do not have this form anywhere in my office.  Please get for me (and see if I need chart with it) in order not to delay further it getting done for pt (since I'm not here tomorrow)

## 2013-11-07 NOTE — Telephone Encounter (Signed)
Pt dropped of from to be filled out for DMV. I am sending back in your folder. Please call pt at [phone] when ready.

## 2013-11-08 ENCOUNTER — Encounter: Payer: Self-pay | Admitting: Family Medicine

## 2013-11-08 NOTE — Telephone Encounter (Signed)
Form located and placed in your chair in your office.

## 2013-11-09 DIAGNOSIS — Z029 Encounter for administrative examinations, unspecified: Secondary | ICD-10-CM

## 2013-11-09 NOTE — Telephone Encounter (Signed)
Form was completed.  These forms always need to be put with their chart (first question is how long were they a pt here--need chart, and also to look at previous forms that were filled out--makes it easier).  There should be a form charge for completion of this form, since not done with a visit

## 2013-11-09 NOTE — Telephone Encounter (Signed)
Pt was called at [phone] and informed that dot form was ready and she will have a $25 form fee.

## 2013-11-10 LAB — HM DIABETES EYE EXAM

## 2013-12-05 ENCOUNTER — Encounter: Payer: Self-pay | Admitting: Internal Medicine

## 2013-12-26 ENCOUNTER — Ambulatory Visit
Admission: RE | Admit: 2013-12-26 | Discharge: 2013-12-26 | Disposition: A | Discharge status: Home / Self Care | Payer: BC Managed Care – PPO | Source: Ambulatory Visit | Attending: Family Medicine | Admitting: Family Medicine

## 2013-12-26 DIAGNOSIS — R928 Other abnormal and inconclusive findings on diagnostic imaging of breast: Secondary | ICD-10-CM

## 2014-01-24 ENCOUNTER — Telehealth: Payer: Self-pay | Admitting: Family Medicine

## 2014-01-24 DIAGNOSIS — F988 Other specified behavioral and emotional disorders with onset usually occurring in childhood and adolescence: Secondary | ICD-10-CM

## 2014-01-25 MED ORDER — AMPHETAMINE-DEXTROAMPHETAMINE 20 MG PO TABS
20.0000 mg | ORAL_TABLET | ORAL | Status: DC
Start: 2014-01-25 — End: 2014-03-09

## 2014-01-25 NOTE — Telephone Encounter (Signed)
Left message, Rx Adderall ready for pick up

## 2014-01-25 NOTE — Telephone Encounter (Signed)
done

## 2014-03-08 ENCOUNTER — Telehealth: Payer: Self-pay | Admitting: Family Medicine

## 2014-03-08 DIAGNOSIS — F988 Other specified behavioral and emotional disorders with onset usually occurring in childhood and adolescence: Secondary | ICD-10-CM

## 2014-03-08 NOTE — Telephone Encounter (Signed)
Needs refill on adderal Rx  Please call when ready

## 2014-03-09 MED ORDER — AMPHETAMINE-DEXTROAMPHETAMINE 20 MG PO TABS: 20.0000 mg | ORAL_TABLET | ORAL | Status: DC

## 2014-03-09 NOTE — Telephone Encounter (Signed)
Has appt in July. Done--please advise pt

## 2014-03-09 NOTE — Telephone Encounter (Signed)
Pt.notified

## 2014-04-03 ENCOUNTER — Encounter: Payer: Self-pay | Admitting: Family Medicine

## 2014-04-03 ENCOUNTER — Ambulatory Visit (INDEPENDENT_AMBULATORY_CARE_PROVIDER_SITE_OTHER): Payer: BC Managed Care – PPO | Admitting: Family Medicine

## 2014-04-03 VITALS — BP 132/74 | HR 76 | Ht 65.0 in | Wt 281.0 lb

## 2014-04-03 DIAGNOSIS — D509 Iron deficiency anemia, unspecified: Secondary | ICD-10-CM

## 2014-04-03 DIAGNOSIS — E559 Vitamin D deficiency, unspecified: Secondary | ICD-10-CM

## 2014-04-03 DIAGNOSIS — J309 Allergic rhinitis, unspecified: Secondary | ICD-10-CM

## 2014-04-03 DIAGNOSIS — F988 Other specified behavioral and emotional disorders with onset usually occurring in childhood and adolescence: Secondary | ICD-10-CM

## 2014-04-03 DIAGNOSIS — I10 Essential (primary) hypertension: Secondary | ICD-10-CM

## 2014-04-03 DIAGNOSIS — E119 Type 2 diabetes mellitus without complications: Secondary | ICD-10-CM

## 2014-04-03 LAB — CBC WITH DIFFERENTIAL/PLATELET
Basophils Absolute: 0 K/uL (ref 0.0–0.1)
Basophils Relative: 0 % (ref 0–1)
Eosinophils Absolute: 0.3 K/uL (ref 0.0–0.7)
Eosinophils Relative: 4 % (ref 0–5)
HCT: 30.6 % — ABNORMAL LOW (ref 36.0–46.0)
Hemoglobin: 9.7 g/dL — ABNORMAL LOW (ref 12.0–15.0)
Lymphocytes Relative: 24 % (ref 12–46)
Lymphs Abs: 1.7 K/uL (ref 0.7–4.0)
MCH: 23.3 pg — ABNORMAL LOW (ref 26.0–34.0)
MCHC: 31.7 g/dL (ref 30.0–36.0)
MCV: 73.4 fL — ABNORMAL LOW (ref 78.0–100.0)
Monocytes Absolute: 0.3 K/uL (ref 0.1–1.0)
Monocytes Relative: 4 % (ref 3–12)
Neutro Abs: 4.9 K/uL (ref 1.7–7.7)
Neutrophils Relative %: 68 % (ref 43–77)
Platelets: 361 K/uL (ref 150–400)
RBC: 4.17 MIL/uL (ref 3.87–5.11)
RDW: 17.5 % — ABNORMAL HIGH (ref 11.5–15.5)
WBC: 7.2 K/uL (ref 4.0–10.5)

## 2014-04-03 LAB — POCT GLYCOSYLATED HEMOGLOBIN (HGB A1C): Hemoglobin A1C: 6

## 2014-04-03 MED ORDER — METFORMIN HCL ER 500 MG PO TB24: 1000.0000 mg | ORAL_TABLET | Freq: Every day | ORAL | Status: DC

## 2014-04-03 MED ORDER — LOSARTAN POTASSIUM-HCTZ 100-25 MG PO TABS: 1.0000 | ORAL_TABLET | Freq: Every day | ORAL | Status: DC

## 2014-04-03 MED ORDER — AMPHETAMINE-DEXTROAMPHETAMINE 20 MG PO TABS: 20.0000 mg | ORAL_TABLET | ORAL | Status: DC

## 2014-04-03 MED ORDER — FLUTICASONE PROPIONATE 50 MCG/ACT NA SUSP: 2.0000 | Freq: Every day | NASAL | Status: DC

## 2014-04-03 NOTE — Patient Instructions (Signed)
Start checking your sugars periodically--this will help keep your diet in check.  Cut out sweet tea and regular soda, cut out the ice cream and junk.  Use exercise to help with stress levels.  Your blood pressure was high--limit sodium in the diet, exercise lowers blood pressure.  I'm concerned that the higher dose of adderall might also be contributing.  Use as little as you need to get by (ie 1/2 pill in the afternoons, especially if it is affecting your sleep, which can also affect concentration).  It is recommended that you get at least 30 minutes of aerobic exercise at least 5 days/week (for weight loss, you may need as much as 60-90 minutes). This can be any activity that gets your heart rate up. This can be divided in 10-15 minute intervals if needed, but try and build up your endurance at least once a week.  Weight bearing exercise is also recommended twice weekly.

## 2014-04-03 NOTE — Progress Notes (Signed)
Chief Complaint  Patient presents with  . Diabetes    fasting med check. Patient has a form that she needs filled out for school giving her more time for testing due to her ADD.    ADD follow-up: She brings in a form, requesting it to be filled out so that she can get extended time on her tests.  She is in school, taking accounting and economics currently.  She had originally been on 40mg  in the morning, 20mg  in the afternoon, but had only been taking 1.5 in the mornings for quite some time, and only took the pm dose if she needed it to study in the evenings.  Most recently, while taking accounting, she has been needing to take 3 pills (2 in the morning and 1 in the afternoon, about 2:30-3pm) every day--admitting that she has needed this since about February, because her calculus was also rough (class prior to her current ones).  She will take meds on Sunday, when studying, but doesn't usually take one on Saturdays if not studying. Denies side effects, except perhaps a little more trouble sleeping.    Anemia--stopped taking iron in February or March.  Her periods remain regular, monthly, but no longer as heavy.  Her last CBC showed Hg 10.7 and she was instructed to take it BID x 1-2 months, then once daily, but BID for the week of her cycle.  She hasn't taken any for 4-5 months.  Diabetes--she doesn't check her blood sugars at home.  She has a machine, but no test strips.  Compliant with her medications, no side effects. She is stress eating (stressed from her tough class), and has been drinking sweet tea for the last 3 weeks.  She had cut out the Kool-Aid, but has been eating junk, drinking sodas and sweet tea x 3 weeks. She has been walking with her granddaughter--at least a mile in the evening, 3x/week for the last 2 weeks.  Hypertension--she feels very stressed from her class (accounting), which she thinks is contributing to her blood pressure being high.  She doesn't check blood pressure elsewhere.   Denies headaches (occasional, related to stress); no chest pain, palpitations, edema.  Allergies:  Well controlled with Flonase.  Needs refill.  Past Medical History  Diagnosis Date  . Hypertension   . Obesity   . ADHD (attention deficit hyperactivity disorder)   . Anemia   . Diabetes mellitus     AODM  . Impaired fasting glucose   . Unspecified vitamin D deficiency 05/2013   Past Surgical History  Procedure Laterality Date  . Uterine  10/2010    POLYP  . Knee arthroscopy  02/18/12    left (torn meniscus); Dr. Ronnie Derby  . Breast biopsy Left 10/2013    fibroadenoma   History   Social History  . Marital Status: Single    Spouse Name: N/A    Number of Children: N/A  . Years of Education: N/A   Occupational History  . Not on file.   Social History Main Topics  . Smoking status: Never Smoker   . Smokeless tobacco: Never Used  . Alcohol Use: No  . Drug Use: No  . Sexual Activity: Not on file   Other Topics Concern  . Not on file   Social History Narrative   In school for business administration, as well as bus driver for [employer] Encounter Prescriptions as of 04/03/2014  Medication Sig Note  . [START ON 04/08/2014] amphetamine-dextroamphetamine (ADDERALL)  20 MG tablet Take 1 tablet (20 mg total) by mouth See admin instructions. Take 1.5-2 tablets in the morning and 1 tablet in the evening, as directed   . cholecalciferol (VITAMIN D) 1000 UNITS tablet Take 1,000 Units by mouth daily.   . fluticasone (FLONASE) 50 MCG/ACT nasal spray Place 2 sprays into both nostrils daily.   Marland Kitchen losartan-hydrochlorothiazide (HYZAAR) 100-25 MG per tablet Take 1 tablet by mouth daily.   . metFORMIN (GLUCOPHAGE-XR) 500 MG 24 hr tablet Take 2 tablets (1,000 mg total) by mouth daily with breakfast.   . Multiple Vitamins-Minerals (MULTIVITAMIN WITH MINERALS) tablet Take 1 tablet by mouth daily.   . naproxen sodium (ANAPROX) 220 MG tablet Take 440 mg by mouth as needed.   .  [DISCONTINUED] amphetamine-dextroamphetamine (ADDERALL) 20 MG tablet Take 1 tablet (20 mg total) by mouth See admin instructions. Take 1.5 tablets in the morning and 1 tablet in the evening, as directed 04/03/2014: Has been taking 2 in the morning, 1 in the afternoon (most days, just 1 on weekends if studying)  . [DISCONTINUED] fluticasone (FLONASE) 50 MCG/ACT nasal spray Place 2 sprays into the nose daily.   . [DISCONTINUED] losartan-hydrochlorothiazide (HYZAAR) 100-25 MG per tablet Take 1 tablet by mouth daily.   . [DISCONTINUED] metFORMIN (GLUCOPHAGE-XR) 500 MG 24 hr tablet Take 2 tablets (1,000 mg total) by mouth daily with breakfast.   . Ferrous Sulfate (IRON) 325 (65 FE) MG TABS Take 1 tablet by mouth daily.  04/03/2014: Stopped taking iron in February or March  . ibuprofen (ADVIL,MOTRIN) 200 MG tablet Take 200 mg by mouth every 6 (six) hours as needed for mild pain.    Allergies  Allergen Reactions  . Onglyza [Saxagliptin] Other (See Comments)    Eye irritation (when on Kombiglyze; tolerates metformin without problems)   ROS:  Denies fevers, chills, URI symptoms.  Allergies are controlled with flonase.  Denies cough, shortness of breath, palpitations, nausea, vomiting, diarrhea, skin rashes, bleeding/bruising.  Moods have been good, no longer any issues with depression. See HPI.  +5# weight gain since last visit.  PHYSICAL EXAM: BP 122/94  Pulse 76  Ht 5\' 5"  (1.651 m)  Wt 281 lb (127.461 kg)  BMI 46.76 kg/m2  LMP 04/02/2014 132/74 on repeat by MD Pleasant obese female in no distress  HEENT: Conjunctiva clear. EOMI  Neck: no lymphadenopathy, thyromegaly or carotid bruit  Heart: regular rate and rhythm without murmur  Lungs: clear bilaterally  Abdomen: obese, soft, nontender, no organomegaly or mass  Extremities: 2+ pulse, no edema. Normal diabetic foot exam  Psych: normal mood, affect, hygiene and grooming  Neuro: alert and oriented. Cranial nerves grossly intact. Normal gait,  strength, sensation   Lab Results  Component Value Date   HGBA1C 6.0 04/03/2014   ASSESSMENT/PLAN:  Type II or unspecified type diabetes mellitus without mention of complication, not stated as uncontrolled - controlled per A1c, but recently poor diet and weight gain. Discussed stress reduction exercise, weight loss, diet - Plan: HgB A1c, Glucose, random, metFORMIN (GLUCOPHAGE-XR) 500 MG 24 hr tablet, HM Diabetes Foot Exam  ADD (attention deficit disorder) - Requiring higher dose of meds due to intense/difficult classes and studying. Use higher doses prn only - Plan: amphetamine-dextroamphetamine (ADDERALL) 20 MG tablet  Essential hypertension, benign - improved on repeat.  Exercise, weight loss, low sodium diet and stress reduction discussed - Plan: losartan-hydrochlorothiazide (HYZAAR) 100-25 MG per tablet  Anemia, iron deficiency - noncompliant with iron supplementation. Periods are finally a little lighter.  Recheck,  expect to still be low - Plan: Ferritin, CBC with Differential, Iron  Unspecified vitamin D deficiency - improved upon last check--continue supplementation  Allergic rhinitis, cause unspecified - well controlled with Flonase, continue - Plan: fluticasone (FLONASE) 50 MCG/ACT nasal spray  Morbid obesity with body mass index of 40.0-49.9 - counseled extensively re: diet, exercise, risks  CBC, ferritin, iron, glucose   Counseled re: stress reduction, exercise, low sodium diet, healthy snacks, portion control.  Cut out sweet tea, sodas, junk.  Start checking sugars--will help keep accountable. Increase exercise.  F/u 6 months, sooner prn

## 2014-04-04 LAB — FERRITIN: Ferritin: 7 ng/mL — ABNORMAL LOW (ref 10–291)

## 2014-04-04 LAB — GLUCOSE, RANDOM: Glucose, Bld: 84 mg/dL (ref 70–99)

## 2014-04-04 LAB — IRON: Iron: 24 ug/dL — ABNORMAL LOW (ref 42–145)

## 2014-04-05 ENCOUNTER — Other Ambulatory Visit: Payer: Self-pay | Admitting: *Deleted

## 2014-04-05 DIAGNOSIS — D649 Anemia, unspecified: Secondary | ICD-10-CM

## 2014-06-29 ENCOUNTER — Other Ambulatory Visit: Payer: Self-pay | Admitting: Family Medicine

## 2014-06-29 DIAGNOSIS — N6002 Solitary cyst of left breast: Secondary | ICD-10-CM

## 2014-07-03 ENCOUNTER — Other Ambulatory Visit (INDEPENDENT_AMBULATORY_CARE_PROVIDER_SITE_OTHER): Payer: BC Managed Care – PPO

## 2014-07-03 DIAGNOSIS — Z23 Encounter for immunization: Secondary | ICD-10-CM

## 2014-07-03 DIAGNOSIS — D649 Anemia, unspecified: Secondary | ICD-10-CM

## 2014-07-03 LAB — CBC WITH DIFFERENTIAL/PLATELET
Basophils Absolute: 0 10*3/uL (ref 0.0–0.1)
Basophils Relative: 0 % (ref 0–1)
EOS ABS: 0.2 10*3/uL (ref 0.0–0.7)
Eosinophils Relative: 3 % (ref 0–5)
HCT: 35.6 % — ABNORMAL LOW (ref 36.0–46.0)
Hemoglobin: 11.7 g/dL — ABNORMAL LOW (ref 12.0–15.0)
Lymphocytes Relative: 24 % (ref 12–46)
Lymphs Abs: 1.7 10*3/uL (ref 0.7–4.0)
MCH: 25.7 pg — AB (ref 26.0–34.0)
MCHC: 32.9 g/dL (ref 30.0–36.0)
MCV: 78.2 fL (ref 78.0–100.0)
MONOS PCT: 5 % (ref 3–12)
Monocytes Absolute: 0.4 10*3/uL (ref 0.1–1.0)
NEUTROS ABS: 4.9 10*3/uL (ref 1.7–7.7)
Neutrophils Relative %: 68 % (ref 43–77)
Platelets: 302 10*3/uL (ref 150–400)
RBC: 4.55 MIL/uL (ref 3.87–5.11)
RDW: 18.3 % — ABNORMAL HIGH (ref 11.5–15.5)
WBC: 7.2 10*3/uL (ref 4.0–10.5)

## 2014-07-03 LAB — FERRITIN: FERRITIN: 24 ng/mL (ref 10–291)

## 2014-07-07 ENCOUNTER — Other Ambulatory Visit: Payer: Self-pay

## 2014-07-17 ENCOUNTER — Ambulatory Visit
Admission: RE | Admit: 2014-07-17 | Discharge: 2014-07-17 | Disposition: A | Discharge status: Home / Self Care | Payer: BC Managed Care – PPO | Source: Ambulatory Visit | Attending: Family Medicine | Admitting: Family Medicine

## 2014-07-17 DIAGNOSIS — N6002 Solitary cyst of left breast: Secondary | ICD-10-CM

## 2014-08-03 ENCOUNTER — Telehealth: Payer: Self-pay | Admitting: Internal Medicine

## 2014-08-03 DIAGNOSIS — F988 Other specified behavioral and emotional disorders with onset usually occurring in childhood and adolescence: Secondary | ICD-10-CM

## 2014-08-03 MED ORDER — AMPHETAMINE-DEXTROAMPHETAMINE 20 MG PO TABS: 20.0000 mg | ORAL_TABLET | ORAL | Status: DC

## 2014-08-03 NOTE — Telephone Encounter (Signed)
Pt needs a refill on adderall. Call pt when ready

## 2014-08-03 NOTE — Telephone Encounter (Signed)
Printed/signed.  Advise

## 2014-08-04 NOTE — Telephone Encounter (Signed)
Called and left message that her rx was ready

## 2014-08-22 ENCOUNTER — Telehealth: Payer: Self-pay | Admitting: Family Medicine

## 2014-09-12 NOTE — Telephone Encounter (Signed)
P.A. Approved, pt & pharmacy informed

## 2014-10-09 ENCOUNTER — Ambulatory Visit (INDEPENDENT_AMBULATORY_CARE_PROVIDER_SITE_OTHER): Payer: BC Managed Care – PPO | Admitting: Family Medicine

## 2014-10-09 ENCOUNTER — Ambulatory Visit (INDEPENDENT_AMBULATORY_CARE_PROVIDER_SITE_OTHER): Payer: BC Managed Care – PPO

## 2014-10-09 ENCOUNTER — Encounter: Payer: Self-pay | Admitting: Podiatry

## 2014-10-09 ENCOUNTER — Ambulatory Visit (INDEPENDENT_AMBULATORY_CARE_PROVIDER_SITE_OTHER): Payer: BC Managed Care – PPO | Admitting: Podiatry

## 2014-10-09 ENCOUNTER — Encounter: Payer: Self-pay | Admitting: Family Medicine

## 2014-10-09 VITALS — BP 130/80 | HR 72 | Ht 65.0 in | Wt 278.0 lb

## 2014-10-09 VITALS — BP 137/82 | HR 73 | Resp 12

## 2014-10-09 DIAGNOSIS — F909 Attention-deficit hyperactivity disorder, unspecified type: Secondary | ICD-10-CM

## 2014-10-09 DIAGNOSIS — M7672 Peroneal tendinitis, left leg: Secondary | ICD-10-CM

## 2014-10-09 DIAGNOSIS — D509 Iron deficiency anemia, unspecified: Secondary | ICD-10-CM

## 2014-10-09 DIAGNOSIS — M79673 Pain in unspecified foot: Secondary | ICD-10-CM

## 2014-10-09 DIAGNOSIS — F988 Other specified behavioral and emotional disorders with onset usually occurring in childhood and adolescence: Secondary | ICD-10-CM

## 2014-10-09 DIAGNOSIS — E559 Vitamin D deficiency, unspecified: Secondary | ICD-10-CM

## 2014-10-09 DIAGNOSIS — Z0000 Encounter for general adult medical examination without abnormal findings: Secondary | ICD-10-CM

## 2014-10-09 DIAGNOSIS — E119 Type 2 diabetes mellitus without complications: Secondary | ICD-10-CM

## 2014-10-09 DIAGNOSIS — I10 Essential (primary) hypertension: Secondary | ICD-10-CM

## 2014-10-09 LAB — POCT URINALYSIS DIPSTICK
Bilirubin, UA: NEGATIVE
Glucose, UA: NEGATIVE
KETONES UA: NEGATIVE
LEUKOCYTES UA: NEGATIVE
NITRITE UA: NEGATIVE
Protein, UA: NEGATIVE
RBC UA: NEGATIVE
Spec Grav, UA: 1.02
UROBILINOGEN UA: NEGATIVE
pH, UA: 6

## 2014-10-09 LAB — CBC WITH DIFFERENTIAL/PLATELET
BASOS ABS: 0 10*3/uL (ref 0.0–0.1)
Basophils Relative: 0 % (ref 0–1)
Eosinophils Absolute: 0.1 10*3/uL (ref 0.0–0.7)
Eosinophils Relative: 2 % (ref 0–5)
HCT: 37.3 % (ref 36.0–46.0)
Hemoglobin: 12.3 g/dL (ref 12.0–15.0)
Lymphocytes Relative: 22 % (ref 12–46)
Lymphs Abs: 1.6 10*3/uL (ref 0.7–4.0)
MCH: 27.3 pg (ref 26.0–34.0)
MCHC: 33 g/dL (ref 30.0–36.0)
MCV: 82.7 fL (ref 78.0–100.0)
MONO ABS: 0.4 10*3/uL (ref 0.1–1.0)
MPV: 10.2 fL (ref 8.6–12.4)
Monocytes Relative: 5 % (ref 3–12)
NEUTROS ABS: 5.3 10*3/uL (ref 1.7–7.7)
Neutrophils Relative %: 71 % (ref 43–77)
PLATELETS: 360 10*3/uL (ref 150–400)
RBC: 4.51 MIL/uL (ref 3.87–5.11)
RDW: 15.2 % (ref 11.5–15.5)
WBC: 7.4 10*3/uL (ref 4.0–10.5)

## 2014-10-09 LAB — COMPREHENSIVE METABOLIC PANEL
ALBUMIN: 3.9 g/dL (ref 3.5–5.2)
ALT: 15 U/L (ref 0–35)
AST: 13 U/L (ref 0–37)
Alkaline Phosphatase: 63 U/L (ref 39–117)
BILIRUBIN TOTAL: 0.4 mg/dL (ref 0.2–1.2)
BUN: 8 mg/dL (ref 6–23)
CO2: 29 meq/L (ref 19–32)
Calcium: 9.4 mg/dL (ref 8.4–10.5)
Chloride: 96 mEq/L (ref 96–112)
Creat: 0.76 mg/dL (ref 0.50–1.10)
GLUCOSE: 108 mg/dL — AB (ref 70–99)
Potassium: 3.4 mEq/L — ABNORMAL LOW (ref 3.5–5.3)
Sodium: 134 mEq/L — ABNORMAL LOW (ref 135–145)
TOTAL PROTEIN: 7.3 g/dL (ref 6.0–8.3)

## 2014-10-09 LAB — LIPID PANEL
CHOL/HDL RATIO: 3.9 ratio
Cholesterol: 139 mg/dL (ref 0–200)
HDL: 36 mg/dL — ABNORMAL LOW (ref >39)
LDL Cholesterol: 81 mg/dL (ref 0–99)
Triglycerides: 112 mg/dL (ref <150)
VLDL: 22 mg/dL (ref 0–40)

## 2014-10-09 LAB — TSH: TSH: 1.61 u[IU]/mL (ref 0.350–4.500)

## 2014-10-09 LAB — POCT GLYCOSYLATED HEMOGLOBIN (HGB A1C): Hemoglobin A1C: 6.2

## 2014-10-09 LAB — FERRITIN: Ferritin: 27 ng/mL (ref 10–291)

## 2014-10-09 MED ORDER — METFORMIN HCL ER 500 MG PO TB24: 1000.0000 mg | ORAL_TABLET | Freq: Every day | ORAL | Status: DC

## 2014-10-09 MED ORDER — MELOXICAM 7.5 MG PO TABS: 7.5000 mg | ORAL_TABLET | Freq: Every day | ORAL | Status: DC

## 2014-10-09 MED ORDER — LOSARTAN POTASSIUM-HCTZ 100-25 MG PO TABS: 1.0000 | ORAL_TABLET | Freq: Every day | ORAL | Status: DC

## 2014-10-09 NOTE — Patient Instructions (Signed)
Peroneal Tendinitis with Rehab Tendonitis is inflammation of a tendon. Inflammation of the tendons on the back of the outer ankle (peroneal tendons) is known as peroneal tendonitis. The peroneal tendons are responsible for connecting the muscles that allow you to stand on your tiptoes to the bones of the ankle. For this reason, peroneal tendonitis often causes pain when trying to complete such motions. Peroneal tendonitis often involves a tear (strain) of the peroneal tendons. Strains are classified into three categories. Grade 1 strains cause pain, but the tendon is not lengthened. Grade 2 strains include a lengthened ligament, due to the ligament being stretched or partially ruptured. With grade 2 strains there is still function, although function may be decreased. Grade 3 strains involve a complete tear of the tendon or muscle, and function is usually impaired. SYMPTOMS   Pain, tenderness, swelling, warmth, or redness over the back of the outer side of the ankle, the outer part of the mid-foot, or the bottom of the arch.  Pain that gets worse with ankle motion (especially when pushing off or pushing down with the front of the foot), or when standing on the ball of the foot or pushing the foot outward.  Crackling sound (crepitation) when the tendon is moved or touched. CAUSES  Peroneal tendinitis occurs when injury to the peroneal tendons causes the body to respond with inflammation. Common causes of injury include:  An overuse injury, in which the groove behind the outer ankle (where the tendon is located) causes wear on the tendon.  A sudden stress placed on the tendon, such as from an increase in the intensity, frequency, or duration of training.  Direct hit (trauma) to the tendon.  Return to activity too soon after a previous ankle injury. RISK INCREASES WITH:  Sports that require sudden, repetitive pushing off of the foot, such as jumping or quick starts.  Kicking and running sports,  especially running down hills or long distances.  Poor strength and flexibility.  Previous injury to the foot, ankle, or leg. PREVENTION  Warm up and stretch properly before activity.  Allow for adequate recovery between workouts.  Maintain physical fitness:  Strength, flexibility, and endurance.  Cardiovascular fitness.  Complete rehabilitation after previous injury. PROGNOSIS  If treated properly, peroneal tendonitis usually heals within 6 weeks.  RELATED COMPLICATIONS  Longer healing time, if not properly treated or if not given enough time to heal.  Recurring symptoms if activity is resumed too soon, with overuse, or when using poor technique.  If untreated, tendinitis may result in tendon rupture, requiring surgery. TREATMENT  Treatment first involves the use of ice and medicine to reduce pain and inflammation. The use of strengthening and stretching exercises may help reduce pain with activity. These exercises may be performed at home or with a therapist. Sometimes, the foot and ankle will be restrained for 10 to 14 days to promote healing. Your caregiver may advise that you place a heel lift in your shoes to reduce the stress placed on the tendon. If nonsurgical treatment is unsuccessful, surgery to remove the inflamed tendon lining (sheath) may be advised.  MEDICATION   If pain medicine is needed, nonsteroidal anti-inflammatory medicines (aspirin and ibuprofen), or other minor pain relievers (acetaminophen), are often advised.  Do not take pain medicine for 7 days before surgery.  Prescription pain relievers may be given, if your caregiver thinks they are needed. Use only as directed and only as much as you need. HEAT AND COLD  Cold treatment (icing) should   be applied for 10 to 15 minutes every 2 to 3 hours for inflammation and pain, and immediately after activity that aggravates your symptoms. Use ice packs or an ice massage.  Heat treatment may be used before  performing stretching and strengthening activities prescribed by your caregiver, physical therapist, or athletic trainer. Use a heat pack or a warm water soak. SEEK MEDICAL CARE IF:  Symptoms get worse or do not improve in 2 to 4 weeks, despite treatment.  New, unexplained symptoms develop. (Drugs used in treatment may produce side effects.) EXERCISES RANGE OF MOTION (ROM) AND STRETCHING EXERCISES - Peroneal Tendinitis These exercises may help you when beginning to rehabilitate your injury. Your symptoms may resolve with or without further involvement from your physician, physical therapist or athletic trainer. While completing these exercises, remember:   Restoring tissue flexibility helps normal motion to return to the joints. This allows healthier, less painful movement and activity.  An effective stretch should be held for at least 30 seconds.  A stretch should never be painful. You should only feel a gentle lengthening or release in the stretched tissue. RANGE OF MOTION - Ankle Eversion  Sit with your right / left ankle crossed over your opposite knee.  Grip your foot with your opposite hand, placing your thumb on the top of your foot and your fingers across the bottom of your foot.  Gently push your foot downward with a slight rotation, so your littlest toes rise slightly toward the ceiling.  You should feel a gentle stretch on the inside of your ankle. Hold the stretch for __________ seconds. Repeat __________ times. Complete this exercise __________ times per day.  RANGE OF MOTION - Ankle Inversion  Sit with your right / left ankle crossed over your opposite knee.  Grip your foot with your opposite hand, placing your thumb on the bottom of your foot and your fingers across the top of your foot.  Gently pull your foot so the smallest toe comes toward you and your thumb pushes the inside of the ball of your foot away from you.  You should feel a gentle stretch on the outside of  your ankle. Hold the stretch for __________ seconds. Repeat __________ times. Complete this exercise __________ times per day.  RANGE OF MOTION - Ankle Plantar Flexion  Sit with your right / left leg crossed over your opposite knee.  Use your opposite hand to pull the top of your foot and toes toward you.  You should feel a gentle stretch on the top of your foot and ankle. Hold this position for __________ seconds. Repeat __________ times. Complete __________ times per day.  STRETCH - Gastroc, Standing  Place your hands on a wall.  Extend your right / left leg behind you, keeping the front knee somewhat bent.  Slightly point your toes inward on your back foot.  Keeping your right / left heel on the floor and your knee straight, shift your weight toward the wall, not allowing your back to arch.  You should feel a gentle stretch in the calf. Hold this position for __________ seconds. Repeat __________ times. Complete this stretch __________ times per day. STRETCH - Soleus, Standing  Place your hands on a wall.  Extend your right / left leg behind you, keeping the other knee somewhat bent.  Slightly point your toes inward on your back foot.  Keep your heel on the floor, bend your back knee, and slightly shift your weight over the back leg so that   you feel a gentle stretch deep in your back calf.  Hold this position for __________ seconds. Repeat __________ times. Complete this stretch __________ times per day. STRETCH - Gastrocsoleus, Standing Note: This exercise can place a lot of stress on your foot and ankle. Please complete this exercise only if specifically instructed by your caregiver.   Place the ball of your right / left foot on a step, keeping your other foot firmly on the same step.  Hold on to the wall or a rail for balance.  Slowly lift your other foot, allowing your body weight to press your heel down over the edge of the step.  You should feel a stretch in your  right / left calf.  Hold this position for __________ seconds.  Repeat this exercise with a slight bend in your knee. Repeat __________ times. Complete this stretch __________ times per day.  STRENGTHENING EXERCISES - Peroneal Tendinitis  These exercises may help you when beginning to rehabilitate your injury. They may resolve your symptoms with or without further involvement from your physician, physical therapist or athletic trainer. While completing these exercises, remember:   Muscles can gain both the endurance and the strength needed for everyday activities through controlled exercises.  Complete these exercises as instructed by your physician, physical therapist or athletic trainer. Increase the resistance and repetitions only as guided by your caregiver. STRENGTH - Dorsiflexors  Secure a rubber exercise band or tubing to a fixed object (table, pole) and loop the other end around your right / left foot.  Sit on the floor facing the fixed object. The band should be slightly tense when your foot is relaxed.  Slowly draw your foot back toward you, using your ankle and toes.  Hold this position for __________ seconds. Slowly release the tension in the band and return your foot to the starting position. Repeat __________ times. Complete this exercise __________ times per day.  STRENGTH - Towel Curls  Sit in a chair, on a non-carpeted surface.  Place your foot on a towel, keeping your heel on the floor.  Pull the towel toward your heel only by curling your toes. Keep your heel on the floor.  If instructed by your physician, physical therapist or athletic trainer, add weight to the end of the towel. Repeat __________ times. Complete this exercise __________ times per day. STRENGTH - Ankle Eversion   Secure one end of a rubber exercise band or tubing to a fixed object (table, pole). Loop the other end around your foot, just before your toes.  Place your fists between your knees.  This will focus your strengthening at your ankle.  Drawing the band across your opposite foot, away from the pole, slowly, pull your little toe out and up. Make sure the band is positioned to resist the entire motion.  Hold this position for __________ seconds.  Have your muscles resist the band, as it slowly pulls your foot back to the starting position. Repeat __________ times. Complete this exercise __________ times per day.  Document Released: 09/08/2005 Document Revised: 01/23/2014 Document Reviewed: 12/21/2008 [facility] Patient Information 2015 [place], [place]. This information is not intended to replace advice given to you by your health care provider. Make sure you discuss any questions you have with your health care provider.  Achilles Tendinitis  with Rehab Achilles tendinitis is a disorder of the Achilles tendon. The Achilles tendon connects the large calf muscles (Gastrocnemius and Soleus) to the heel bone (calcaneus). This tendon is sometimes called the  heel cord. It is important for pushing-off and standing on your toes and is important for walking, running, or jumping. Tendinitis is often caused by overuse and repetitive microtrauma. SYMPTOMS  Pain, tenderness, swelling, warmth, and redness may occur over the Achilles tendon even at rest.  Pain with pushing off, or flexing or extending the ankle.  Pain that is worsened after or during activity. CAUSES   Overuse sometimes seen with rapid increase in exercise programs or in sports requiring running and jumping.  Poor physical conditioning (strength and flexibility or endurance).  Running sports, especially training running down hills.  Inadequate warm-up before practice or play or failure to stretch before participation.  Injury to the tendon. PREVENTION   Warm up and stretch before practice or competition.  Allow time for adequate rest and recovery between practices and competition.  Keep up conditioning.  Keep up  ankle and leg flexibility.  Improve or keep muscle strength and endurance.  Improve cardiovascular fitness.  Use proper technique.  Use proper equipment (shoes, skates).  To help prevent recurrence, taping, protective strapping, or an adhesive bandage may be recommended for several weeks after healing is complete. PROGNOSIS   Recovery may take weeks to several months to heal.  Longer recovery is expected if symptoms have been prolonged.  Recovery is usually quicker if the inflammation is due to a direct blow as compared with overuse or sudden strain. RELATED COMPLICATIONS   Healing time will be prolonged if the condition is not correctly treated. The injury must be given plenty of time to heal.  Symptoms can reoccur if activity is resumed too soon.  Untreated, tendinitis may increase the risk of tendon rupture requiring additional time for recovery and possibly surgery. TREATMENT   The first treatment consists of rest anti-inflammatory medication, and ice to relieve the pain.  Stretching and strengthening exercises after resolution of pain will likely help reduce the risk of recurrence. Referral to a physical therapist or athletic trainer for further evaluation and treatment may be helpful.  A walking boot or cast may be recommended to rest the Achilles tendon. This can help break the cycle of inflammation and microtrauma.  Arch supports (orthotics) may be prescribed or recommended by your caregiver as an adjunct to therapy and rest.  Surgery to remove the inflamed tendon lining or degenerated tendon tissue is rarely necessary and has shown less than predictable results. MEDICATION   Nonsteroidal anti-inflammatory medications, such as aspirin and ibuprofen, may be used for pain and inflammation relief. Do not take within 7 days before surgery. Take these as directed by your caregiver. Contact your caregiver immediately if any bleeding, stomach upset, or signs of allergic  reaction occur. Other minor pain relievers, such as acetaminophen, may also be used.  Pain relievers may be prescribed as necessary by your caregiver. Do not take prescription pain medication for longer than 4 to 7 days. Use only as directed and only as much as you need.  Cortisone injections are rarely indicated. Cortisone injections may weaken tendons and predispose to rupture. It is better to give the condition more time to heal than to use them. HEAT AND COLD  Cold is used to relieve pain and reduce inflammation for acute and chronic Achilles tendinitis. Cold should be applied for 10 to 15 minutes every 2 to 3 hours for inflammation and pain and immediately after any activity that aggravates your symptoms. Use ice packs or an ice massage.  Heat may be used before performing stretching and strengthening  activities prescribed by your caregiver. Use a heat pack or a warm soak. SEEK MEDICAL CARE IF:  Symptoms get worse or do not improve in 2 weeks despite treatment.  New, unexplained symptoms develop. Drugs used in treatment may produce side effects. EXERCISES RANGE OF MOTION (ROM) AND STRETCHING EXERCISES - Achilles Tendinitis  These exercises may help you when beginning to rehabilitate your injury. Your symptoms may resolve with or without further involvement from your physician, physical therapist or athletic trainer. While completing these exercises, remember:   Restoring tissue flexibility helps normal motion to return to the joints. This allows healthier, less painful movement and activity.  An effective stretch should be held for at least 30 seconds.  A stretch should never be painful. You should only feel a gentle lengthening or release in the stretched tissue. STRETCH - Gastroc, Standing   Place hands on wall.  Extend right / left leg, keeping the front knee somewhat bent.  Slightly point your toes inward on your back foot.  Keeping your right / left heel on the floor and  your knee straight, shift your weight toward the wall, not allowing your back to arch.  You should feel a gentle stretch in the right / left calf. Hold this position for __________ seconds. Repeat __________ times. Complete this stretch __________ times per day. STRETCH - Soleus, Standing   Place hands on wall.  Extend right / left leg, keeping the other knee somewhat bent.  Slightly point your toes inward on your back foot.  Keep your right / left heel on the floor, bend your back knee, and slightly shift your weight over the back leg so that you feel a gentle stretch deep in your back calf.  Hold this position for __________ seconds. Repeat __________ times. Complete this stretch __________ times per day. STRETCH - Gastrocsoleus, Standing  Note: This exercise can place a lot of stress on your foot and ankle. Please complete this exercise only if specifically instructed by your caregiver.   Place the ball of your right / left foot on a step, keeping your other foot firmly on the same step.  Hold on to the wall or a rail for balance.  Slowly lift your other foot, allowing your body weight to press your heel down over the edge of the step.  You should feel a stretch in your right / left calf.  Hold this position for __________ seconds.  Repeat this exercise with a slight bend in your knee. Repeat __________ times. Complete this stretch __________ times per day.  STRENGTHENING EXERCISES - Achilles Tendinitis These exercises may help you when beginning to rehabilitate your injury. They may resolve your symptoms with or without further involvement from your physician, physical therapist or athletic trainer. While completing these exercises, remember:   Muscles can gain both the endurance and the strength needed for everyday activities through controlled exercises.  Complete these exercises as instructed by your physician, physical therapist or athletic trainer. Progress the resistance  and repetitions only as guided.  You may experience muscle soreness or fatigue, but the pain or discomfort you are trying to eliminate should never worsen during these exercises. If this pain does worsen, stop and make certain you are following the directions exactly. If the pain is still present after adjustments, discontinue the exercise until you can discuss the trouble with your clinician. STRENGTH - Plantar-flexors   Sit with your right / left leg extended. Holding onto both ends of a rubber exercise band/tubing,  loop it around the ball of your foot. Keep a slight tension in the band.  Slowly push your toes away from you, pointing them downward.  Hold this position for __________ seconds. Return slowly, controlling the tension in the band/tubing. Repeat __________ times. Complete this exercise __________ times per day.  STRENGTH - Plantar-flexors   Stand with your feet shoulder width apart. Steady yourself with a wall or table using as little support as needed.  Keeping your weight evenly spread over the width of your feet, rise up on your toes.*  Hold this position for __________ seconds. Repeat __________ times. Complete this exercise __________ times per day.  *If this is too easy, shift your weight toward your right / left leg until you feel challenged. Ultimately, you may be asked to do this exercise with your right / left foot only. STRENGTH - Plantar-flexors, Eccentric  Note: This exercise can place a lot of stress on your foot and ankle. Please complete this exercise only if specifically instructed by your caregiver.   Place the balls of your feet on a step. With your hands, use only enough support from a wall or rail to keep your balance.  Keep your knees straight and rise up on your toes.  Slowly shift your weight entirely to your right / left toes and pick up your opposite foot. Gently and with controlled movement, lower your weight through your right / left foot so that  your heel drops below the level of the step. You will feel a slight stretch in the back of your calf at the end position.  Use the healthy leg to help rise up onto the balls of both feet, then lower weight only on the right / left leg again. Build up to 15 repetitions. Then progress to 3 consecutive sets of 15 repetitions.*  After completing the above exercise, complete the same exercise with a slight knee bend (about 30 degrees). Again, build up to 15 repetitions. Then progress to 3 consecutive sets of 15 repetitions.* Perform this exercise __________ times per day.  *When you easily complete 3 sets of 15, your physician, physical therapist or athletic trainer may advise you to add resistance by wearing a backpack filled with additional weight. STRENGTH - Plantar Flexors, Seated   Sit on a chair that allows your feet to rest flat on the ground. If necessary, sit at the edge of the chair.  Keeping your toes firmly on the ground, lift your right / left heel as far as you can without increasing any discomfort in your ankle. Repeat __________ times. Complete this exercise __________ times a day. *If instructed by your physician, physical therapist or athletic trainer, you may add ____________________ of resistance by placing a weighted object on your right / left knee. Document Released: 04/09/2005 Document Revised: 12/01/2011 Document Reviewed: 12/21/2008 [facility] Patient Information 2015 [place], [place]. This information is not intended to replace advice given to you by your health care provider. Make sure you discuss any questions you have with your health care provider.

## 2014-10-09 NOTE — Patient Instructions (Signed)

## 2014-10-09 NOTE — Progress Notes (Signed)
   Subjective:    Patient ID: Crystal [name], female    DOB: [birth year], 49 y.o.   MRN: [id number]  HPI 49 year old female presents the office today for complaints of pain on the outside aspect of her left foot which has been ongoing for approximately one year and his been intermittent however his been progressive. She states that she previously has worn a compression stocking, a boot, and soaking the foot without much resolution in symptoms. She states that she previously been seen by orthopedics who recommended physical therapy however she cannot afford at that time. She denies any history of injury or trauma to the area. She states that she has pain after prolonged ambulation or in certain shoes. No other complaints at this time.  She has diabetic and she states her last HbA1c was 6.2. She denies any history of ulceration, tingling/numbness or any intermittent claudication symptoms.  Review of Systems  Neurological: Positive for headaches.  All other systems reviewed and are negative.      Objective:   Physical Exam AAO 3, NAD DP/PT pulses palpable bilaterally, CRT less than 3 seconds Protective sensation appears to be intact with Derrel Nip monofilament, vibratory sensation intact, Achilles tendon reflex intact. There is mild tenderness overlying the course of the peroneal tendons inferior to the lateral malleolus and along the insertion into the fifth metatarsal base. There is mild pain with eversion of the foot. Equinus is present bilaterally with a left greater than right. There is no areas of pinpoint bony tenderness or pain the vibratory sensation to bilateral lower extremities. No overlying edema, erythema, increase in warmth. MMT 5/5, ROM WNL No open lesions or pre-ulcer lesions identified. No pain with calf compression, swelling, warmth, erythema.      Assessment & Plan:  49 year old female with left foot likely peroneal tendinitis -X-rays were obtained and reviewed with  the patient. -Treatment options were discussed including alternatives, risks, complications. -Discussed likely etiology of her foot pain. -Discussed with her stretching exercises to help strengthen the peroneal tendons. Also discussed stretching exercises for the equinus.  -Discussed with her possible ankle brace if needed for periods of that today however not to wear the brace long-term as I could weaken the area. If she needs to wear in the short-term or upon periods of prolonged ambulation she can but we off the brace as the pain subsides. -Prescribed meloxicam. Recommended her to stop other anti-inflammatory medications. Monitor for side effects the medication and directed to call the office should any occur and stop the medication. -Follow-up in 4 weeks or sooner should any problems arise. In the meantime, occur she call the office any questions, concerns, change in symptoms. Follow-up with PCP for other issues mentioned in the ROS.
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
## 2014-10-10 LAB — MICROALBUMIN / CREATININE URINE RATIO
CREATININE, URINE: 103 mg/dL
MICROALB UR: 0.5 mg/dL (ref <2.0)
Microalb Creat Ratio: 4.9 mg/g (ref 0.0–30.0)

## 2014-10-10 LAB — VITAMIN D 25 HYDROXY (VIT D DEFICIENCY, FRACTURES): Vit D, 25-Hydroxy: 13 ng/mL — ABNORMAL LOW (ref 30–100)

## 2014-10-10 MED ORDER — VITAMIN D (ERGOCALCIFEROL) 1.25 MG (50000 UNIT) PO CAPS: 50000.0000 [IU] | ORAL_CAPSULE | ORAL | Status: DC

## 2014-10-10 NOTE — Addendum Note (Signed)
Addended by: Rita Ohara on: 10/10/2014 08:38 AM   Modules accepted: Orders

## 2014-10-30 ENCOUNTER — Other Ambulatory Visit: Payer: Self-pay

## 2014-10-30 DIAGNOSIS — Z1231 Encounter for screening mammogram for malignant neoplasm of breast: Secondary | ICD-10-CM

## 2014-11-08 ENCOUNTER — Encounter: Payer: Self-pay | Admitting: Podiatry

## 2014-11-08 ENCOUNTER — Ambulatory Visit (INDEPENDENT_AMBULATORY_CARE_PROVIDER_SITE_OTHER): Payer: BC Managed Care – PPO | Admitting: Podiatry

## 2014-11-08 DIAGNOSIS — M7672 Peroneal tendinitis, left leg: Secondary | ICD-10-CM

## 2014-11-08 MED ORDER — DICLOFENAC SODIUM 1 % TD GEL: 2.0000 g | Freq: Four times a day (QID) | TRANSDERMAL | Status: DC
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
## 2014-11-13 ENCOUNTER — Telehealth: Payer: Self-pay | Admitting: Internal Medicine

## 2014-11-13 DIAGNOSIS — F988 Other specified behavioral and emotional disorders with onset usually occurring in childhood and adolescence: Secondary | ICD-10-CM

## 2014-11-13 MED ORDER — AMPHETAMINE-DEXTROAMPHETAMINE 20 MG PO TABS: 20.0000 mg | ORAL_TABLET | ORAL | Status: DC

## 2014-11-13 NOTE — Telephone Encounter (Signed)
Pt needs a refill on her adderall 20mg . Call when ready

## 2014-11-13 NOTE — Telephone Encounter (Signed)
Patient advised.

## 2014-11-13 NOTE — Telephone Encounter (Signed)
Printed.  Please bring back to me for signature

## 2014-11-15 ENCOUNTER — Other Ambulatory Visit: Payer: Self-pay

## 2014-11-15 ENCOUNTER — Ambulatory Visit
Admission: RE | Admit: 2014-11-15 | Discharge: 2014-11-15 | Disposition: A | Discharge status: Home / Self Care | Payer: BC Managed Care – PPO | Source: Ambulatory Visit

## 2014-11-15 DIAGNOSIS — Z1231 Encounter for screening mammogram for malignant neoplasm of breast: Secondary | ICD-10-CM

## 2014-11-22 ENCOUNTER — Telehealth: Payer: Self-pay

## 2014-11-22 NOTE — Telephone Encounter (Signed)
Pt called stating that the voltaren gel prescribed is not authorized by her insurance, sent in basket to doctor for advise
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
## 2015-02-01 ENCOUNTER — Telehealth: Payer: Self-pay | Admitting: Family Medicine

## 2015-02-01 NOTE — Telephone Encounter (Signed)
Pt called for adderall refill.

## 2015-02-01 NOTE — Telephone Encounter (Signed)
I didn't get a chance to do this before leaving the office today (missed this message).  If she needs it before Monday, please have another provider refill on my behalf.  If she can wait until Monday, then please print out rx for #60, no refill and leave on my desk; I will sign Monday and pt can pick up. Thanks

## 2015-02-02 ENCOUNTER — Other Ambulatory Visit: Payer: Self-pay | Admitting: Family Medicine

## 2015-02-02 DIAGNOSIS — F988 Other specified behavioral and emotional disorders with onset usually occurring in childhood and adolescence: Secondary | ICD-10-CM

## 2015-02-02 MED ORDER — AMPHETAMINE-DEXTROAMPHETAMINE 20 MG PO TABS: 20.0000 mg | ORAL_TABLET | ORAL | Status: DC

## 2015-02-02 NOTE — Telephone Encounter (Signed)
LMOM TO CB. CLS 

## 2015-02-02 NOTE — Telephone Encounter (Signed)
Patient states that she has enough and she can wait until Monday. Rx is on your desk

## 2015-02-05 NOTE — Telephone Encounter (Signed)
Signed--please advise pt ready

## 2015-04-09 ENCOUNTER — Ambulatory Visit (INDEPENDENT_AMBULATORY_CARE_PROVIDER_SITE_OTHER): Payer: BC Managed Care – PPO | Admitting: Family Medicine

## 2015-04-09 ENCOUNTER — Encounter: Payer: Self-pay | Admitting: Family Medicine

## 2015-04-09 VITALS — BP 140/80 | HR 64 | Ht 65.0 in | Wt 283.0 lb

## 2015-04-09 DIAGNOSIS — F909 Attention-deficit hyperactivity disorder, unspecified type: Secondary | ICD-10-CM | POA: Diagnosis not present

## 2015-04-09 DIAGNOSIS — F988 Other specified behavioral and emotional disorders with onset usually occurring in childhood and adolescence: Secondary | ICD-10-CM

## 2015-04-09 DIAGNOSIS — D509 Iron deficiency anemia, unspecified: Secondary | ICD-10-CM

## 2015-04-09 DIAGNOSIS — E119 Type 2 diabetes mellitus without complications: Secondary | ICD-10-CM

## 2015-04-09 DIAGNOSIS — E559 Vitamin D deficiency, unspecified: Secondary | ICD-10-CM

## 2015-04-09 DIAGNOSIS — E876 Hypokalemia: Secondary | ICD-10-CM | POA: Diagnosis not present

## 2015-04-09 DIAGNOSIS — I10 Essential (primary) hypertension: Secondary | ICD-10-CM | POA: Diagnosis not present

## 2015-04-09 LAB — CBC WITH DIFFERENTIAL/PLATELET
Basophils Absolute: 0 10*3/uL (ref 0.0–0.1)
Basophils Relative: 0 % (ref 0–1)
EOS PCT: 3 % (ref 0–5)
Eosinophils Absolute: 0.2 10*3/uL (ref 0.0–0.7)
HEMATOCRIT: 36 % (ref 36.0–46.0)
Hemoglobin: 11.6 g/dL — ABNORMAL LOW (ref 12.0–15.0)
Lymphocytes Relative: 26 % (ref 12–46)
Lymphs Abs: 2 10*3/uL (ref 0.7–4.0)
MCH: 26.1 pg (ref 26.0–34.0)
MCHC: 32.2 g/dL (ref 30.0–36.0)
MCV: 80.9 fL (ref 78.0–100.0)
MONOS PCT: 5 % (ref 3–12)
MPV: 10.5 fL (ref 8.6–12.4)
Monocytes Absolute: 0.4 10*3/uL (ref 0.1–1.0)
Neutro Abs: 5 10*3/uL (ref 1.7–7.7)
Neutrophils Relative %: 66 % (ref 43–77)
PLATELETS: 384 10*3/uL (ref 150–400)
RBC: 4.45 MIL/uL (ref 3.87–5.11)
RDW: 15.8 % — AB (ref 11.5–15.5)
WBC: 7.5 10*3/uL (ref 4.0–10.5)

## 2015-04-09 LAB — HEMOGLOBIN A1C
Hgb A1c MFr Bld: 6.8 % — ABNORMAL HIGH (ref <5.7)
Mean Plasma Glucose: 148 mg/dL — ABNORMAL HIGH (ref <117)

## 2015-04-09 LAB — BASIC METABOLIC PANEL
BUN: 8 mg/dL (ref 6–23)
CO2: 27 mEq/L (ref 19–32)
CREATININE: 0.68 mg/dL (ref 0.50–1.10)
Calcium: 9.1 mg/dL (ref 8.4–10.5)
Chloride: 99 mEq/L (ref 96–112)
GLUCOSE: 103 mg/dL — AB (ref 70–99)
Potassium: 3.7 mEq/L (ref 3.5–5.3)
SODIUM: 142 meq/L (ref 135–145)

## 2015-04-09 MED ORDER — AMPHETAMINE-DEXTROAMPHETAMINE 20 MG PO TABS: 20.0000 mg | ORAL_TABLET | ORAL | Status: DC

## 2015-04-09 MED ORDER — METFORMIN HCL ER 500 MG PO TB24: 1000.0000 mg | ORAL_TABLET | Freq: Every day | ORAL | Status: DC

## 2015-04-09 MED ORDER — LOSARTAN POTASSIUM-HCTZ 100-25 MG PO TABS: 1.0000 | ORAL_TABLET | Freq: Every day | ORAL | Status: DC

## 2015-04-09 NOTE — Progress Notes (Signed)
Chief Complaint  Patient presents with  . ADHD    fasting med check.   . Hypertension   Seeing podiatrist for left peroneal tendinitis.  Still needing to take OTC NSAIDs and using topical diclofenac (which doesn't seem to help) Up 5# since January. Not able to get much exercise due to her foot pain.  ADD follow-up: She is out of school for summer, but restarts in August.  She is asking for refill while she is here. Only takes on the days she has classes and heavy studying. She has 2 tough classes, and 2 classes that are easier.  Denies side effects, except perhaps a little more trouble sleeping on the days she has to use it in the afternoon. She takes afternoon pill on the days she has evening classes. She feels like the current dose works well.   Anemia--she is taking iron supplement now just around her menstrual cycles. Her periods over the last year have been lighter, only an occasional heavy period, sometimes fluctuating back and forth between light and heavy.  Diabetes--She is no longer checking her sugars.  She needs a meter. She has her granddaughter and her niece 2 days/week over the summer and has been baking a lot (brownies, cakes).  She is careful with her watermelon portions.  Eating more vegetables, cherries. Compliant with her medications, no side effects. She drinks a lot of water, 4 ounces of orange juice.  She went back to drinking regular ginger ale twice a week (she had cut out all together).  She is baking her fish rather than frying. Last eye exam was 10/2013.  She joined the gym, but still hasn't gone(due to her left foot pain. 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  Hypertension-- She doesn't check blood pressure elsewhere. Denies any headaches, chest pain, palpitations, edema. She has been eating more sandwiches (freshly sliced [place] from deli section), no other changes in sodium intake.     Vitamin D deficiency--level was 13 6 months ago.  She completed 3 months of weekly prescription vitamin D.  She continues to take a multivitamin daily (the same one she was taking prior).  PMH, PSH, SH reviewed.  Outpatient Encounter Prescriptions as of 04/09/2015  Medication Sig Note  . amphetamine-dextroamphetamine (ADDERALL) 20 MG tablet Take 1 tablet (20 mg total) by mouth See admin instructions. Take 1.5-2 tablets in the morning and 1 tablet in the evening, as directed   . diclofenac sodium (VOLTAREN) 1 % GEL Apply 2 g topically 4 (four) times daily. Rub into affected area of foot 2 to 4 times daily 04/09/2015: Uses on her left foot (not helping) from podiatrist  . Ferrous Sulfate (IRON) 325 (65 FE) MG TABS Take 1 tablet by mouth daily.  04/09/2015: Taking around her menstrual cycles only  . losartan-hydrochlorothiazide (HYZAAR) 100-25 MG per tablet Take 1 tablet by mouth daily.   . metFORMIN (GLUCOPHAGE-XR) 500 MG 24 hr tablet Take 2 tablets (1,000 mg total) by mouth daily with breakfast.   . Multiple Vitamins-Minerals (MULTIVITAMIN WITH MINERALS) tablet Take 1 tablet by mouth daily.   . fluticasone (FLONASE) 50 MCG/ACT nasal spray Place 2 sprays into both nostrils daily. (Patient not taking: Reported on 04/09/2015)   . ibuprofen (ADVIL,MOTRIN) 200 MG tablet Take 200 mg by mouth every 6 (six) hours as needed for mild pain.   . naproxen sodium (ANAPROX) 220 MG tablet Take 440  mg by mouth as needed. 04/09/2015: Uses prn for left foot pain  . [DISCONTINUED] cholecalciferol (VITAMIN D) 1000 UNITS tablet Take 1,000 Units by mouth daily.   . [DISCONTINUED] meloxicam (MOBIC) 7.5 MG tablet Take 1 tablet (7.5 mg total) by mouth daily.   . [DISCONTINUED] Vitamin D, Ergocalciferol, (DRISDOL) 50000 UNITS CAPS capsule Take 1 capsule (50,000 Units total) by mouth every 7 (seven) days.    No facility-administered encounter medications on file as of 04/09/2015.   Allergies  Allergen Reactions  . Onglyza  [Saxagliptin] Other (See Comments)    Eye irritation (when on Kombiglyze; tolerates metformin without problems)   ROS:  Denies fever, chills, URI or sinus problems, cough, shortness of breath, urinary complaints, bleeding, bruising, rashes.  Denies any GI symptoms. Leg cramps have resolved (using lavendar oil or shower gel). No chest pain, palpitations, headaches, dizziness.    PHYSICAL EXAM: BP 140/80 mmHg  Pulse 64  Ht 5' 5" (1.651 m)  Wt 283 lb (128.368 kg)  BMI 47.09 kg/m2  LMP 03/28/2015  152/106 on repeat by MD She reports being 7/10 pain at time of visit.  Pleasant obese female in no distress  HEENT: Conjunctiva clear. EOMI, PERRL. Neck: no lymphadenopathy, thyromegaly or carotid bruit  Heart: regular rate and rhythm without murmur  Lungs: clear bilaterally  Abdomen: obese, soft, nontender, no organomegaly or mass  Extremities: 2+ pulse, no edema, normal sensation. Psych: normal mood, affect, hygiene and grooming  Neuro: alert and oriented. Cranial nerves grossly intact. Normal gait, strength, sensation   ASSESSMENT/PLAN:  ADD (attention deficit disorder) - controlled on current regimen; restart when classes restart. - Plan: amphetamine-dextroamphetamine (ADDERALL) 20 MG tablet  Essential hypertension, benign - elevated today possibly related to pain.  low sodium diet; monitor elsewhere and f/u if persistently high. goals reviewed. f/u 3 mos - Plan: Basic metabolic panel, losartan-hydrochlorothiazide (HYZAAR) 100-25 MG per tablet  Anemia, iron deficiency - currently using iron only with cycles. recheck today - Plan: CBC with Differential/Platelet  Vitamin D deficiency - expect it to be low, since didn't increase daily supplementation since last rx (just MVI daily) - Plan: Vit D  25 hydroxy (rtn osteoporosis monitoring)  Morbid obesity with body mass index of 40.0-49.9  Diabetes mellitus type 2, controlled, without complications - Plan: Basic metabolic panel,  Hemoglobin A1c  Hypokalemia - Plan: Basic metabolic panel  Essential hypertension, benign - Plan: Basic metabolic panel, losartan-hydrochlorothiazide (HYZAAR) 100-25 MG per tablet  Type 2 diabetes mellitus without complication - Plan: metFORMIN (GLUCOPHAGE-XR) 500 MG 24 hr tablet  ADD (attention deficit disorder) - Plan: amphetamine-dextroamphetamine (ADDERALL) 20 MG tablet   Encouraged f/u for her left foot pain (PT? Cortisone injection?) to avoid further weight gain  Vit D deficiency--expoect it will be low since no add'l vitamin D was added since last treated (MVI same)  b-met, CBC, Vit D, A1c    Maximum dose of naproxen is 2 pills twice daily (that is the prescription equivalent)--take with food, and cut back on the dose if it bothers your stomach. Do not use both ibuprofen and naproxen (aleve) together, nor use any Goody or BC with it. You MAY use extra strength tylenol along with the anti-inflammatories to help with pain relief.    You need to start monitoring your blood pressure (or return here for BP check when pain is less) so that we can make sure that your blood pressure is adequately being treated with your current medication (vs needing to adjust the medications).  Goal  is <130-135/80-85.  Continue to limit your sodium intake.  Daily exercise lowers both sugar and blood pressure.  Start checking blood sugar periodically--let us know if your insurance covers the strips for the machines we have available to give you, otherwise, let us know which monitor to prescribe (+strips, etc).  return 3 months to f/u on blood pressure--hopefully pain is better, and BP is better by then.  You can return sooner if blood pressures are consistently running high.

## 2015-04-09 NOTE — Patient Instructions (Addendum)
Please remember to start additional Vitamin D (an extra 1000-2000 IU daily)--if you are prescribed a weekly vitamin D, you don't need to start this extra over-the-counter vitamin D until you finish the prescription.  Try and get regular exercise--consider pool/swimming. Consider additional treatment for your tendinitis so that you can be more active and not limited by your pain. (PT? Cortisone shot?)   Maximum dose of naproxen is 2 pills twice daily (that is the prescription equivalent)--take with food, and cut back on the dose if it bothers your stomach. Do not use both ibuprofen and naproxen (aleve) together, nor use any Goody or BC with it. You MAY use extra strength tylenol along with the anti-inflammatories to help with pain relief.    You need to start monitoring your blood pressure (or return here for BP check when pain is less) so that we can make sure that your blood pressure is adequately being treated with your current medication (vs needing to adjust the medications).  Goal is <130-135/80-85.  Continue to limit your sodium intake.  Daily exercise lowers both sugar and blood pressure.  Start checking blood sugar periodically--let us know if your insurance covers the strips for the machines we have available to give you, otherwise, let us know which monitor to prescribe (+strips, etc).  return 3 months to f/u on blood pressure--hopefully pain is better, and BP is better by then.  You can return sooner if blood pressures are consistently running high.  Low-Sodium Eating Plan Sodium raises blood pressure and causes water to be held in the body. Getting less sodium from food will help lower your blood pressure, reduce any swelling, and protect your heart, liver, and kidneys. We get sodium by adding salt (sodium chloride) to food. Most of our sodium comes from canned, boxed, and frozen foods. Restaurant foods, fast foods, and pizza are also very high in sodium. Even if you take medicine to  lower your blood pressure or to reduce fluid in your body, getting less sodium from your food is important. WHAT IS MY PLAN? Most people should limit their sodium intake to 2,300 mg a day. Your health care provider recommends that you limit your sodium intake to __________ a day.  WHAT DO I NEED TO KNOW ABOUT THIS EATING PLAN? For the low-sodium eating plan, you will follow these general guidelines:  Choose foods with a % Daily Value for sodium of less than 5% (as listed on the food label).   Use salt-free seasonings or herbs instead of table salt or sea salt.   Check with your health care provider or pharmacist before using salt substitutes.   Eat fresh foods.  Eat more vegetables and fruits.  Limit canned vegetables. If you do use them, rinse them well to decrease the sodium.   Limit cheese to 1 oz (28 g) per day.   Eat lower-sodium products, often labeled as "lower sodium" or "no salt added."  Avoid foods that contain monosodium glutamate (MSG). MSG is sometimes added to [place] food and some canned foods.  Check food labels (Nutrition Facts labels) on foods to learn how much sodium is in one serving.  Eat more home-cooked food and less restaurant, buffet, and fast food.  When eating at a restaurant, ask that your food be prepared with less salt or none, if possible.  HOW DO I READ FOOD LABELS FOR SODIUM INFORMATION? The Nutrition Facts label lists the amount of sodium in one serving of the food. If you eat more than  one serving, you must multiply the listed amount of sodium by the number of servings. Food labels may also identify foods as:  Sodium free--Less than 5 mg in a serving.  Very low sodium--35 mg or less in a serving.  Low sodium--140 mg or less in a serving.  Light in sodium--50% less sodium in a serving. For example, if a food that usually has 300 mg of sodium is changed to become light in sodium, it will have 150 mg of sodium.  Reduced sodium--25%  less sodium in a serving. For example, if a food that usually has 400 mg of sodium is changed to reduced sodium, it will have 300 mg of sodium. WHAT FOODS CAN I EAT? Grains Low-sodium cereals, including oats, puffed wheat and rice, and shredded wheat cereals. Low-sodium crackers. Unsalted rice and pasta. Lower-sodium bread.  Vegetables Frozen or fresh vegetables. Low-sodium or reduced-sodium canned vegetables. Low-sodium or reduced-sodium tomato sauce and paste. Low-sodium or reduced-sodium tomato and vegetable juices.  Fruits Fresh, frozen, and canned fruit. Fruit juice.  Meat and Other Protein Products Low-sodium canned tuna and salmon. Fresh or frozen meat, poultry, seafood, and fish. Lamb. Unsalted nuts. Dried beans, peas, and lentils without added salt. Unsalted canned beans. Homemade soups without salt. Eggs.  Dairy Milk. Soy milk. Ricotta cheese. Low-sodium or reduced-sodium cheeses. Yogurt.  Condiments Fresh and dried herbs and spices. Salt-free seasonings. Onion and garlic powders. Low-sodium varieties of mustard and ketchup. Lemon juice.  Fats and Oils Reduced-sodium salad dressings. Unsalted butter.  Other Unsalted popcorn and pretzels.  The items listed above may not be a complete list of recommended foods or beverages. Contact your dietitian for more options. WHAT FOODS ARE NOT RECOMMENDED? Grains Instant hot cereals. Bread stuffing, pancake, and biscuit mixes. Croutons. Seasoned rice or pasta mixes. Noodle soup cups. Boxed or frozen macaroni and cheese. Self-rising flour. Regular salted crackers. Vegetables Regular canned vegetables. Regular canned tomato sauce and paste. Regular tomato and vegetable juices. Frozen vegetables in sauces. Salted french fries. Olives. Angie Fava. Relishes. Sauerkraut. Salsa. Meat and Other Protein Products Salted, canned, smoked, spiced, or pickled meats, seafood, or fish. Bacon, ham, sausage, hot dogs, corned beef, chipped beef, and  packaged luncheon meats. Salt pork. Jerky. Pickled herring. Anchovies, regular canned tuna, and sardines. Salted nuts. Dairy Processed cheese and cheese spreads. Cheese curds. Blue cheese and cottage cheese. Buttermilk.  Condiments Onion and garlic salt, seasoned salt, table salt, and sea salt. Canned and packaged gravies. Worcestershire sauce. Tartar sauce. Barbecue sauce. Teriyaki sauce. Soy sauce, including reduced sodium. Steak sauce. Fish sauce. Oyster sauce. Cocktail sauce. Horseradish. Regular ketchup and mustard. Meat flavorings and tenderizers. Bouillon cubes. Hot sauce. Tabasco sauce. Marinades. Taco seasonings. Relishes. Fats and Oils Regular salad dressings. Salted butter. Margarine. Ghee. Bacon fat.  Other Potato and tortilla chips. Corn chips and puffs. Salted popcorn and pretzels. Canned or dried soups. Pizza. Frozen entrees and pot pies.  The items listed above may not be a complete list of foods and beverages to avoid. Contact your dietitian for more information. Document Released: 02/28/2002 Document Revised: 09/13/2013 Document Reviewed: 07/13/2013 [facility] Patient Information 2015 [place], [place]. This information is not intended to replace advice given to you by your health care provider. Make sure you discuss any questions you have with your health care provider.

## 2015-04-10 LAB — VITAMIN D 25 HYDROXY (VIT D DEFICIENCY, FRACTURES): VIT D 25 HYDROXY: 23 ng/mL — AB (ref 30–100)

## 2015-04-10 MED ORDER — VITAMIN D (ERGOCALCIFEROL) 1.25 MG (50000 UNIT) PO CAPS: 50000.0000 [IU] | ORAL_CAPSULE | ORAL | Status: DC

## 2015-04-10 NOTE — Addendum Note (Signed)
Addended byRita Ohara on: 04/10/2015 10:29 AM   Modules accepted: Orders

## 2015-04-26 ENCOUNTER — Other Ambulatory Visit: Payer: Self-pay | Admitting: Family Medicine

## 2015-04-27 ENCOUNTER — Telehealth: Payer: Self-pay | Admitting: Family Medicine

## 2015-04-27 ENCOUNTER — Other Ambulatory Visit: Payer: Self-pay

## 2015-04-27 DIAGNOSIS — E559 Vitamin D deficiency, unspecified: Secondary | ICD-10-CM

## 2015-04-27 MED ORDER — VITAMIN D (ERGOCALCIFEROL) 1.25 MG (50000 UNIT) PO CAPS: 50000.0000 [IU] | ORAL_CAPSULE | ORAL | Status: DC

## 2015-04-27 NOTE — Telephone Encounter (Signed)
Pt called and wants the Vitamin D called into the [facility] pharmacy,it got called to the [place], PT can be reached at [phone]

## 2015-04-27 NOTE — Telephone Encounter (Signed)
This has been done pt stated it was sent to her on my chart that it was going to be refilled

## 2015-07-09 ENCOUNTER — Ambulatory Visit (INDEPENDENT_AMBULATORY_CARE_PROVIDER_SITE_OTHER): Payer: BC Managed Care – PPO | Admitting: Family Medicine

## 2015-07-09 ENCOUNTER — Encounter: Payer: Self-pay | Admitting: Family Medicine

## 2015-07-09 VITALS — BP 116/84 | HR 78 | Resp 12 | Ht 65.0 in | Wt 275.8 lb

## 2015-07-09 DIAGNOSIS — Z23 Encounter for immunization: Secondary | ICD-10-CM | POA: Diagnosis not present

## 2015-07-09 DIAGNOSIS — I10 Essential (primary) hypertension: Secondary | ICD-10-CM

## 2015-07-09 DIAGNOSIS — E119 Type 2 diabetes mellitus without complications: Secondary | ICD-10-CM | POA: Diagnosis not present

## 2015-07-09 DIAGNOSIS — F988 Other specified behavioral and emotional disorders with onset usually occurring in childhood and adolescence: Secondary | ICD-10-CM

## 2015-07-09 DIAGNOSIS — F909 Attention-deficit hyperactivity disorder, unspecified type: Secondary | ICD-10-CM

## 2015-07-09 LAB — POCT GLYCOSYLATED HEMOGLOBIN (HGB A1C): Hemoglobin A1C: 6.6

## 2015-07-09 MED ORDER — AMPHETAMINE-DEXTROAMPHETAMINE 20 MG PO TABS: 20.0000 mg | ORAL_TABLET | ORAL | Status: DC

## 2015-07-09 MED ORDER — LOSARTAN POTASSIUM-HCTZ 100-25 MG PO TABS: 1.0000 | ORAL_TABLET | Freq: Every day | ORAL | Status: DC

## 2015-07-09 NOTE — Patient Instructions (Signed)
Continue weight loss--healthy diet, low sodium, and regular exercise. Continue to monitor your blood pressure.  Please write them down, and bring the paper to your next visit.  Return sooner if your blood pressures are consistently 140/90 or higher. Consider buying a wrist monitor and bringing it to your next visit or scheduling a nurse visit to assess the accuracy of it. Continue your current medications. Please have the eye doctor send [place] a copy of your diabetic eye exam report next week.

## 2015-07-09 NOTE — Progress Notes (Signed)
Chief Complaint  Patient presents with  . Follow-up    BP. Needs Adderall refill  . flu shot    given   ADD follow-up: She is back in school. She didn't need to take Adderall over the summer, but started back when classes started back mid-August. Only takes on the days she has classes and heavy studying. She has 1 tough class, and 2 classes that are easier.This should be her last semester. Denies side effects, except perhaps a little more trouble sleeping on the days she has to use it in the afternoon. She takes afternoon pill on the days she has evening classes. She feels like the current dose works well. She has some eye irritation--notices it since back to taking med again, had no problems over the summer, so thinks it may be related to her Adderall.  Anemia--she is taking iron supplement now just around her menstrual cycles. Her periods over the last year have been lighter, only an occasional heavy period, sometimes fluctuating back and forth between light and heavy. (reviewed and unchanged since last visit).  No hot flashes, some changes in her hair, thinks she is starting to have some perimenopausal changes.  Diabetes--Sugars are averaging 108.  Max was 143, only 30 mins after eating. She admits she has been eating candy corn recently, and has not checked her sugars after eating these sweets.  She finished them and does not plan on buying or eating any more. Eating more vegetables, grapes, watermelon, oranges, pears. Compliant with her medications, no side effects. She drinks a lot of water, no longer having orange juice.  She stopped drinking regular ginger ale. She is baking her fish rather than frying. She is scheduled for an eye exam next week.  She is going to the gym 2-3x/week (or walking).  Her left foot is doing better.  She is on the bicycle for 30-35 minutes, and 10 on the elliptical.  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  Hypertension-- BP at [facility] 118/74, 121/79, sometimes up to 148/90.  It is too busy there to wait and recheck it. . Denies any headaches, chest pain, palpitations, edema.  Deli meat infrequently (fresh from deli, [place] and ham--low sodium). No canned foods. Infrequent Chinese/Japanese.  Vitamin D deficiency-She completed another 3 months of weekly prescription vitamin D. She continues to take a multivitamin daily (the same one she was taking prior), and she plans to start the OTC Vitamin D this week.  PMH, PSH, SH reviewed  Outpatient Encounter Prescriptions as of 07/09/2015  Medication Sig Note  . amphetamine-dextroamphetamine (ADDERALL) 20 MG tablet Take 1 tablet (20 mg total) by mouth See admin instructions. Take 1.5-2 tablets in the morning and 1 tablet in the evening, as directed 07/09/2015: Needs refill   . Ferrous Sulfate (IRON) 325 (65 FE) MG TABS Take 1 tablet by mouth daily.  04/09/2015: Taking around her menstrual cycles only  . fluticasone (FLONASE) 50 MCG/ACT nasal spray Place 2 sprays into both nostrils daily.   Marland Kitchen ibuprofen (ADVIL,MOTRIN) 200 MG tablet Take 200 mg by mouth every 6 (six) hours as needed for mild pain.   Marland Kitchen losartan-hydrochlorothiazide (HYZAAR) 100-25 MG per tablet Take 1 tablet by mouth daily.   . metFORMIN (GLUCOPHAGE-XR) 500 MG 24 hr tablet Take 2 tablets (1,000 mg total) by mouth daily with breakfast.   . Multiple Vitamins-Minerals (MULTIVITAMIN WITH MINERALS) tablet Take  1 tablet by mouth daily.   . naproxen sodium (ANAPROX) 220 MG tablet Take 440 mg by mouth as needed. 04/09/2015: Uses prn for left foot pain  . Vitamin D, Ergocalciferol, (DRISDOL) 50000 UNITS CAPS capsule Take 1 capsule (50,000 Units total) by mouth every 7 (seven) days. (Patient not taking: Reported on 07/09/2015) 07/09/2015: Completed the prescription last week  . [DISCONTINUED] diclofenac sodium (VOLTAREN) 1 % GEL Apply 2 g topically 4 (four) times daily. Rub into  affected area of foot 2 to 4 times daily (Patient not taking: Reported on 07/09/2015) 04/09/2015: Uses on her left foot (not helping) from podiatrist   No facility-administered encounter medications on file as of 07/09/2015.   Allergies  Allergen Reactions  . Onglyza [Saxagliptin] Other (See Comments)    Eye irritation (when on Kombiglyze; tolerates metformin without problems)  (not sure now if eye irritation was really from onglyza portion, now that she is still having some similar complaints, possibly from the adderall)  ROS: +weight loss, intentional. No fever, chills, URI symptoms, cough, chest pain, shortness of breath, palpitations, nausea, vomiting, heartburn, bowel changes, bleeding, bruising, rash, joint pains (foot improved), depression, anxiety, insomnia, or any other complaints.  See HPI.  PHYSICAL EXAM: BP 116/84 mmHg  Pulse 78  Resp 12  Ht 5\' 5"  (1.651 m)  Wt 275 lb 12.8 oz (125.102 kg)  BMI 45.90 kg/m2 140/84 on repeat by MD Well developed, pleasant, obese female in no distress Neck: no lymphadenopathy, thyromegaly or mass, no bruit Heart: regular rate and rhythm without murmur Lungs: clear bilaterally Extremities: no edema, normal pulses Psych: normal mood, affect, hygiene and grooming Neuro: alert and oriented, cranial nerves intact, normal strength, gait   Lab Results  Component Value Date   HGBA1C 6.6% 07/09/2015   (A1c is down from 6.8)  ASSESSMENT/PLAN:  Controlled type 2 diabetes mellitus without complication, without long-term current use of insulin ([place]) - controlled. Continue current meds; daily exercise, further weight loss encouraged. - Plan: HgB A1c  Morbid obesity with body mass index of 40.0-49.9 (HCC)  Essential hypertension, benign - borderline today, fluctuating at home. Low sodium diet, regular exercise, further weight loss and low sodium diet. bring list of BP's to f/u - Plan: losartan-hydrochlorothiazide (HYZAAR) 100-25 MG tablet  ADD  (attention deficit disorder) - controlled on current regimen - Plan: amphetamine-dextroamphetamine (ADDERALL) 20 MG tablet  Immunization due - Plan: Flu Vaccine QUAD 36+ mos IM    Continue weight loss--healthy diet, low sodium, and regular exercise. Continue to monitor your blood pressure.  Please write them down, and bring the paper to your next visit.  Return sooner if your blood pressures are consistently 140/90 or higher. Consider buying a wrist monitor and bringing it to your next visit or scheduling a nurse visit to assess the accuracy of it. Continue your current medications. Please have the eye doctor send [place] a copy of your diabetic eye exam report next week.  F/u 4 mos

## 2015-08-12 ENCOUNTER — Other Ambulatory Visit: Payer: Self-pay | Admitting: Family Medicine

## 2015-11-09 ENCOUNTER — Other Ambulatory Visit: Payer: Self-pay

## 2015-11-09 DIAGNOSIS — Z1231 Encounter for screening mammogram for malignant neoplasm of breast: Secondary | ICD-10-CM

## 2015-11-21 ENCOUNTER — Telehealth: Payer: Self-pay | Admitting: Family Medicine

## 2015-11-21 DIAGNOSIS — F988 Other specified behavioral and emotional disorders with onset usually occurring in childhood and adolescence: Secondary | ICD-10-CM

## 2015-11-21 NOTE — Telephone Encounter (Signed)
Pt called and states that she needs a refill on her adderrall pt can be reached at [phone] (M) when ready to be picked up

## 2015-11-22 ENCOUNTER — Ambulatory Visit
Admission: RE | Admit: 2015-11-22 | Discharge: 2015-11-22 | Disposition: A | Discharge status: Home / Self Care | Payer: BC Managed Care – PPO | Source: Ambulatory Visit

## 2015-11-22 DIAGNOSIS — Z1231 Encounter for screening mammogram for malignant neoplasm of breast: Secondary | ICD-10-CM

## 2015-11-22 MED ORDER — AMPHETAMINE-DEXTROAMPHETAMINE 20 MG PO TABS: 20.0000 mg | ORAL_TABLET | ORAL | Status: DC

## 2015-11-22 NOTE — Telephone Encounter (Signed)
She is due (past) for follow-up.  At her October appointment, we wanted her back in 4 months.  No visits scheduled. Okay for refill (printed); please schedule med check

## 2015-11-22 NOTE — Telephone Encounter (Signed)
Left message for patient to call me back to schedule med check.

## 2015-11-24 ENCOUNTER — Telehealth: Payer: Self-pay | Admitting: Family Medicine

## 2015-11-26 NOTE — Telephone Encounter (Signed)
P.A. Approved til 11/25/18, pt informed , faxed pharmacy

## 2015-11-26 NOTE — Telephone Encounter (Signed)
Approved til 11/25/18

## 2015-12-22 ENCOUNTER — Other Ambulatory Visit: Payer: Self-pay | Admitting: Family Medicine

## 2016-01-02 ENCOUNTER — Encounter: Payer: Self-pay | Admitting: Family Medicine

## 2016-01-02 ENCOUNTER — Ambulatory Visit (INDEPENDENT_AMBULATORY_CARE_PROVIDER_SITE_OTHER): Payer: BC Managed Care – PPO | Admitting: Family Medicine

## 2016-01-02 VITALS — BP 134/76 | HR 68 | Ht 65.0 in | Wt 270.6 lb

## 2016-01-02 DIAGNOSIS — F909 Attention-deficit hyperactivity disorder, unspecified type: Secondary | ICD-10-CM

## 2016-01-02 DIAGNOSIS — I10 Essential (primary) hypertension: Secondary | ICD-10-CM | POA: Diagnosis not present

## 2016-01-02 DIAGNOSIS — F988 Other specified behavioral and emotional disorders with onset usually occurring in childhood and adolescence: Secondary | ICD-10-CM

## 2016-01-02 DIAGNOSIS — J309 Allergic rhinitis, unspecified: Secondary | ICD-10-CM

## 2016-01-02 DIAGNOSIS — D509 Iron deficiency anemia, unspecified: Secondary | ICD-10-CM

## 2016-01-02 DIAGNOSIS — L659 Nonscarring hair loss, unspecified: Secondary | ICD-10-CM

## 2016-01-02 DIAGNOSIS — Z0000 Encounter for general adult medical examination without abnormal findings: Secondary | ICD-10-CM

## 2016-01-02 DIAGNOSIS — E785 Hyperlipidemia, unspecified: Secondary | ICD-10-CM | POA: Diagnosis not present

## 2016-01-02 DIAGNOSIS — E119 Type 2 diabetes mellitus without complications: Secondary | ICD-10-CM | POA: Diagnosis not present

## 2016-01-02 DIAGNOSIS — E559 Vitamin D deficiency, unspecified: Secondary | ICD-10-CM

## 2016-01-02 LAB — COMPREHENSIVE METABOLIC PANEL
ALT: 10 U/L (ref 6–29)
AST: 11 U/L (ref 10–35)
Albumin: 3.9 g/dL (ref 3.6–5.1)
Alkaline Phosphatase: 56 U/L (ref 33–115)
BUN: 12 mg/dL (ref 7–25)
CHLORIDE: 101 mmol/L (ref 98–110)
CO2: 27 mmol/L (ref 20–31)
Calcium: 9.2 mg/dL (ref 8.6–10.2)
Creat: 0.66 mg/dL (ref 0.50–1.10)
GLUCOSE: 97 mg/dL (ref 65–99)
POTASSIUM: 3.5 mmol/L (ref 3.5–5.3)
SODIUM: 138 mmol/L (ref 135–146)
TOTAL PROTEIN: 7 g/dL (ref 6.1–8.1)
Total Bilirubin: 0.5 mg/dL (ref 0.2–1.2)

## 2016-01-02 LAB — CBC WITH DIFFERENTIAL/PLATELET
BASOS ABS: 0 {cells}/uL (ref 0–200)
Basophils Relative: 0 %
EOS PCT: 2 %
Eosinophils Absolute: 168 cells/uL (ref 15–500)
HCT: 35.6 % (ref 35.0–45.0)
Hemoglobin: 11.7 g/dL (ref 11.7–15.5)
LYMPHS PCT: 19 %
Lymphs Abs: 1596 cells/uL (ref 850–3900)
MCH: 26.5 pg — AB (ref 27.0–33.0)
MCHC: 32.9 g/dL (ref 32.0–36.0)
MCV: 80.7 fL (ref 80.0–100.0)
MONOS PCT: 5 %
MPV: 10.7 fL (ref 7.5–12.5)
Monocytes Absolute: 420 cells/uL (ref 200–950)
Neutro Abs: 6216 cells/uL (ref 1500–7800)
Neutrophils Relative %: 74 %
PLATELETS: 349 10*3/uL (ref 140–400)
RBC: 4.41 MIL/uL (ref 3.80–5.10)
RDW: 15.7 % — ABNORMAL HIGH (ref 11.0–15.0)
WBC: 8.4 10*3/uL (ref 4.0–10.5)

## 2016-01-02 LAB — POCT URINALYSIS DIPSTICK
BILIRUBIN UA: NEGATIVE
Blood, UA: NEGATIVE
GLUCOSE UA: NEGATIVE
Ketones, UA: NEGATIVE
LEUKOCYTES UA: NEGATIVE
Nitrite, UA: NEGATIVE
Protein, UA: NEGATIVE
Spec Grav, UA: >=1.03
Urobilinogen, UA: NEGATIVE
pH, UA: 5.5

## 2016-01-02 LAB — LIPID PANEL
CHOL/HDL RATIO: 3.8 ratio (ref <=5.0)
Cholesterol: 129 mg/dL (ref 125–200)
HDL: 34 mg/dL — AB (ref >=46)
LDL CALC: 78 mg/dL (ref <130)
TRIGLYCERIDES: 85 mg/dL (ref <150)
VLDL: 17 mg/dL (ref <30)

## 2016-01-02 LAB — POCT GLYCOSYLATED HEMOGLOBIN (HGB A1C): Hemoglobin A1C: 6.3

## 2016-01-02 MED ORDER — AMPHETAMINE-DEXTROAMPHETAMINE 20 MG PO TABS: 20.0000 mg | ORAL_TABLET | ORAL | Status: DC

## 2016-01-02 MED ORDER — FLUTICASONE PROPIONATE 50 MCG/ACT NA SUSP: 2.0000 | Freq: Every day | NASAL | Status: DC

## 2016-01-02 NOTE — Patient Instructions (Addendum)
  HEALTH MAINTENANCE RECOMMENDATIONS:  It is recommended that you get at least 30 minutes of aerobic exercise at least 5 days/week (for weight loss, you may need as much as 60-90 minutes). This can be any activity that gets your heart rate up. This can be divided in 10-15 minute intervals if needed, but try and build up your endurance at least once a week.  Weight bearing exercise is also recommended twice weekly.  Eat a healthy diet with lots of vegetables, fruits and fiber.  "Colorful" foods have a lot of vitamins (ie green vegetables, tomatoes, red peppers, etc).  Limit sweet tea, regular sodas and alcoholic beverages, all of which has a lot of calories and sugar.  Up to 1 alcoholic drink daily may be beneficial for women (unless trying to lose weight, watch sugars).  Drink a lot of water.  Calcium recommendations are 1200-1500 mg daily (1500 mg for postmenopausal women or women without ovaries), and vitamin D 1000 IU daily.  This should be obtained from diet and/or supplements (vitamins), and calcium should not be taken all at once, but in divided doses.  Monthly self breast exams and yearly mammograms for women over the age of 86 is recommended.  Sunscreen of at least SPF 30 should be used on all sun-exposed parts of the skin when outside between the hours of 10 am and 4 pm (not just when at beach or pool, but even with exercise, golf, tennis, and yard work!)  Use a sunscreen that says "broad spectrum" so it covers both UVA and UVB rays, and make sure to reapply every 1-2 hours.  Remember to change the batteries in your smoke detectors when changing your clock times in the spring and fall.  Use your seat belt every time you are in a car, and please drive safely and not be distracted with cell phones and texting while driving.   You need to increase your amount of aerobic exercise some--try and get an extra day/week. Be sure to get full STD check when you see the gynecologist in a few  weeks. Consider getting 2000 IU of vitamin D, or double up on the 1000 after you miss a day. We will let you know what your current levels are.  Aspirin (enteric-coated) 81mg  daily is recommended. You may stop this if it bothers your stomach.
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
## 2016-01-03 LAB — TSH: TSH: 1.39 m[IU]/L

## 2016-01-03 LAB — VITAMIN D 25 HYDROXY (VIT D DEFICIENCY, FRACTURES): VIT D 25 HYDROXY: 24 ng/mL — AB (ref 30–100)

## 2016-01-03 LAB — MICROALBUMIN / CREATININE URINE RATIO
Creatinine, Urine: 236 mg/dL (ref 20–320)
Microalb Creat Ratio: 3 mcg/mg creat (ref <30)
Microalb, Ur: 0.7 mg/dL

## 2016-01-03 MED ORDER — VITAMIN D (ERGOCALCIFEROL) 1.25 MG (50000 UNIT) PO CAPS: 50000.0000 [IU] | ORAL_CAPSULE | ORAL | Status: DC

## 2016-01-03 NOTE — Addendum Note (Signed)
Addended by: Rita Ohara on: 01/03/2016 08:30 AM   Modules accepted: Orders

## 2016-01-03 NOTE — Addendum Note (Signed)
Addended by: Rita Ohara on: 01/03/2016 08:24 AM   Modules accepted: Level of Service

## 2016-02-25 ENCOUNTER — Other Ambulatory Visit: Payer: Self-pay | Admitting: Family Medicine

## 2016-02-25 ENCOUNTER — Ambulatory Visit (INDEPENDENT_AMBULATORY_CARE_PROVIDER_SITE_OTHER): Payer: BC Managed Care – PPO | Admitting: Family Medicine

## 2016-02-25 VITALS — BP 146/86 | HR 84 | Ht 65.0 in | Wt 279.2 lb

## 2016-02-25 DIAGNOSIS — M5416 Radiculopathy, lumbar region: Secondary | ICD-10-CM

## 2016-02-25 DIAGNOSIS — M62830 Muscle spasm of back: Secondary | ICD-10-CM

## 2016-02-25 DIAGNOSIS — M5417 Radiculopathy, lumbosacral region: Secondary | ICD-10-CM | POA: Diagnosis not present

## 2016-02-25 MED ORDER — KETOROLAC TROMETHAMINE 60 MG/2ML IM SOLN
60.0000 mg | Freq: Once | INTRAMUSCULAR | Status: AC
  Administered 2016-02-25: 60 mg via INTRAMUSCULAR

## 2016-02-25 MED ORDER — METHOCARBAMOL 500 MG PO TABS
500.0000 mg | ORAL_TABLET | Freq: Four times a day (QID) | ORAL | Status: DC | PRN
Start: 2016-02-25 — End: 2016-06-23

## 2016-02-25 MED ORDER — MELOXICAM 15 MG PO TABS: 15.0000 mg | ORAL_TABLET | Freq: Every day | ORAL | Status: DC

## 2016-02-25 NOTE — Progress Notes (Signed)
Chief Complaint  Patient presents with  . Back Pain    x 1 month. Thinks she pulled something in her lower back when getting off stationary bike at the gym. Pain has been progressively getting worse over the last month.   Marland Kitchen FORM    brought in form from [employer], wants to know if you can fill out.     She felt acute onset of pain in her right lower back/buttock when getting off stationery bike at the gym.  It occurred about a month ago.  She has since developed pain radiating down the back of the right leg. Denies numbness, tingling or weakness in the leg. No bowel or bladder problems.  She is starting to now also notice knee discomfort.   Pain is constant; worse with standing and walking, less when seated.  Pain with standing to wash dishes. She has been using heat with temporary improvement.  Massage chair also helps (temporarily).   She started out using tylenol without benefit.  She then changed to Aleve, which hasn't been helping (using for the last 10 days).  She has been using up to 6-8/day recently, without any improvement.  Denies abdominal pain.  She also brings in [facility] forms to be filled out (for her DM, etc), that came in the mail the other day.  Has 30days before it needs to be returned.  PMH, PSH, SH reviewed  Outpatient Encounter Prescriptions as of 02/25/2016  Medication Sig Note  . amphetamine-dextroamphetamine (ADDERALL) 20 MG tablet Take 1 tablet (20 mg total) by mouth See admin instructions. Take 1.5-2 tablets in the morning and 1 tablet in the evening, as directed   . Ferrous Sulfate (IRON) 325 (65 FE) MG TABS Take 1 tablet by mouth daily.  04/09/2015: Taking around her menstrual cycles only  . fluticasone (FLONASE) 50 MCG/ACT nasal spray Place 2 sprays into both nostrils daily.   . Multiple Vitamins-Minerals (MULTIVITAMIN WITH MINERALS) tablet Take 1 tablet by mouth daily. Reported on 01/02/2016   . Vitamin D, Ergocalciferol, (DRISDOL) 50000 units CAPS capsule Take 1 capsule  (50,000 Units total) by mouth every 7 (seven) days.   . [DISCONTINUED] losartan-hydrochlorothiazide (HYZAAR) 100-25 MG tablet Take 1 tablet by mouth daily.   . cholecalciferol (VITAMIN D) 1000 units tablet Take 1,000 Units by mouth daily. Reported on 02/25/2016   . meloxicam (MOBIC) 15 MG tablet Take 1 tablet (15 mg total) by mouth daily.   . methocarbamol (ROBAXIN) 500 MG tablet Take 1-2 tablets (500-1,000 mg total) by mouth every 6 (six) hours as needed for muscle spasms.   . [DISCONTINUED] ibuprofen (ADVIL,MOTRIN) 200 MG tablet Take 200 mg by mouth every 6 (six) hours as needed for mild pain. Reported on 02/25/2016   . [DISCONTINUED] naproxen sodium (ANAPROX) 220 MG tablet Take 440 mg by mouth as needed. Reported on 02/25/2016 02/25/2016: Has been taking 8 tabs daily for lbp pain  . [EXPIRED] ketorolac (TORADOL) injection 60 mg     No facility-administered encounter medications on file as of 02/25/2016.   (robaxin and mobic were rx'd today, not prior to today's visit).  Allergies  Allergen Reactions  . Onglyza [Saxagliptin] Other (See Comments)    Eye irritation (when on Kombiglyze; tolerates metformin without problems)   ROS: no fever, chills, bleeding, bruising, chest pain, shortness of breath, URI symptoms, nausea, vomiting, bowel changes, urinary complaints, numbness, tingling, weakness or other concerns.  PHYSICAL EXAM: BP 146/86 mmHg  Pulse 84  Ht 5\' 5"  (1.651 m)  Wt 279  lb 3.2 oz (126.644 kg)  BMI 46.46 kg/m2  Pleasant female, in mild discomfort with certain position changes  Back: no spinal tenderness. No CVA tenderness Tender at R SI joint and lower lumbar paraspinous muscles on right; tender into the buttock muscles as well Unable to cross her right ankle over her left knee due to pain, suspect pyriformis spasm. 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 Refer to PT - Plan: meloxicam (MOBIC) 15 MG tablet, ketorolac (TORADOL) injection 60 mg  Muscle spasm of back - heat, massage, NSAID's, muscle relaxants, stretching and PT - Plan: methocarbamol (ROBAXIN) 500 MG tablet, ketorolac (TORADOL) injection 60 mg    Risks of excessive NSAID use reviewed with pt.  Risks/NSAID precautions reviewed. Risks/side effects of mobic and robaxin reviewed. Caution with driving until effects known.   You are being given an injection of toradol today (an anti-inflammatory)--wait at least 6 hours after this injection before taking other anti-inflammatory medications. You may continue to use tylenol as needed for pain, along with the anti-inflammatories and muscle relaxant, if needed.  We are referring you to physical therapy ([facility]).  Use the muscle relaxant if needed.  I'm giving you the least sedating kind, but use with caution before driving.   Use the anti-inflammatory as directed, every day until pain is better.  DMV forms to be filled out and returned

## 2016-02-25 NOTE — Patient Instructions (Signed)
  STOP using all Aleve. Using in excess can put your stomach and kidneys in harms way.  You are being given an injection of toradol today (an anti-inflammatory)--wait at least 6 hours after this injection before taking other anti-inflammatory medications. You may continue to use tylenol as needed for pain, along with the anti-inflammatories and muscle relaxant, if needed.  We are referring you to physical therapy ([facility]).  Use the muscle relaxant if needed.  I'm giving you the least sedating kind, but use with caution before driving.   Use the anti-inflammatory as directed, every day until pain is better.

## 2016-02-26 ENCOUNTER — Telehealth: Payer: Self-pay | Admitting: Family Medicine

## 2016-02-26 ENCOUNTER — Encounter: Payer: Self-pay | Admitting: Family Medicine

## 2016-02-26 NOTE — Telephone Encounter (Signed)
Pt needs refill Metformin and send to new pharmacy [facility], ok wait til tomorrow

## 2016-02-27 MED ORDER — METFORMIN HCL ER 500 MG PO TB24: 1000.0000 mg | ORAL_TABLET | Freq: Every day | ORAL | Status: DC

## 2016-02-27 NOTE — Telephone Encounter (Signed)
Done

## 2016-03-01 ENCOUNTER — Other Ambulatory Visit: Payer: Self-pay | Admitting: Family Medicine

## 2016-03-23 ENCOUNTER — Other Ambulatory Visit: Payer: Self-pay | Admitting: Family Medicine

## 2016-03-24 NOTE — Telephone Encounter (Signed)
Is this okay to refill? 

## 2016-03-24 NOTE — Telephone Encounter (Signed)
This was not supposed to be a daily/chronic med.  rx'd 6/5 for back pain.  Not sure if this is on auto-refill, or if pt is really taking it daily. If truly taking it daily, and refill is needed, okay to refill #30, but suggest OV to discuss her back pain further.  She was referred to PT at her last visit, see if she is getting and if helpful.

## 2016-06-04 ENCOUNTER — Other Ambulatory Visit: Payer: Self-pay | Admitting: Family Medicine

## 2016-06-23 ENCOUNTER — Encounter: Payer: Self-pay | Admitting: Family Medicine

## 2016-06-23 ENCOUNTER — Ambulatory Visit (INDEPENDENT_AMBULATORY_CARE_PROVIDER_SITE_OTHER): Payer: BC Managed Care – PPO | Admitting: Family Medicine

## 2016-06-23 VITALS — BP 128/80 | HR 72 | Ht 65.0 in | Wt 282.0 lb

## 2016-06-23 DIAGNOSIS — R079 Chest pain, unspecified: Secondary | ICD-10-CM

## 2016-06-23 DIAGNOSIS — Z23 Encounter for immunization: Secondary | ICD-10-CM

## 2016-06-23 DIAGNOSIS — K219 Gastro-esophageal reflux disease without esophagitis: Secondary | ICD-10-CM

## 2016-06-23 DIAGNOSIS — Z8249 Family history of ischemic heart disease and other diseases of the circulatory system: Secondary | ICD-10-CM | POA: Diagnosis not present

## 2016-06-23 DIAGNOSIS — I10 Essential (primary) hypertension: Secondary | ICD-10-CM | POA: Diagnosis not present

## 2016-06-23 DIAGNOSIS — E119 Type 2 diabetes mellitus without complications: Secondary | ICD-10-CM

## 2016-06-23 MED ORDER — OMEPRAZOLE MAGNESIUM 20 MG PO TBEC: 20.0000 mg | DELAYED_RELEASE_TABLET | Freq: Every day | ORAL | Status: DC

## 2016-06-23 MED ORDER — ASPIRIN 81 MG PO TBEC: 81.0000 mg | DELAYED_RELEASE_TABLET | Freq: Every day | ORAL | Status: DC

## 2016-06-23 MED ORDER — ATORVASTATIN CALCIUM 10 MG PO TABS: 10.0000 mg | ORAL_TABLET | Freq: Every day | ORAL | 3 refills | Status: DC

## 2016-06-23 NOTE — Patient Instructions (Signed)
Start taking Prilosec OTC (or other store brand omeprazole 20mg , or Nexium 24 or Prevacid 15mg )--take one of these medications every day for 2 weeks. If you don't get any relief of symptoms, let me know.  We can discuss at your follow up the next step (prescription vs GI referral).  Contact me sooner if your symptoms are persisting or worsening, as I may change you to a prescription strength.  See handout for information on reflux.  Start taking the atorvastatin in the evening. If you develop side effects including leg cramps or muscle aches, start taking Coenzyme Q10 daily (this is over-the-counter).  If you are still having problems tolerating the statin, we will change it to a different on--let us know.  This is to lower the cholesterol (which was not high on last check)--it is recommended that everybody with diabetes be on a statin medication to reduce the risk of cardiovascular disease (heart attacks, strokes). It is also a good idea to take an enteric coated 81mg  of aspirin ("baby" aspirin) every day.  If this bothers your stomach, or if you have excessive bleeding or bruising, you can stop taking this.  Food Choices for Gastroesophageal Reflux Disease, Adult When you have gastroesophageal reflux disease (GERD), the foods you eat and your eating habits are very important. Choosing the right foods can help ease the discomfort of GERD. WHAT GENERAL GUIDELINES DO I NEED TO FOLLOW?  Choose fruits, vegetables, whole grains, low-fat dairy products, and low-fat meat, fish, and poultry.  Limit fats such as oils, salad dressings, butter, nuts, and avocado.  Keep a food diary to identify foods that cause symptoms.  Avoid foods that cause reflux. These may be different for different people.  Eat frequent small meals instead of three large meals each day.  Eat your meals slowly, in a relaxed setting.  Limit fried foods.  Cook foods using methods other than frying.  Avoid drinking  alcohol.  Avoid drinking large amounts of liquids with your meals.  Avoid bending over or lying down until 2-3 hours after eating. WHAT FOODS ARE NOT RECOMMENDED? The following are some foods and drinks that may worsen your symptoms: Vegetables Tomatoes. Tomato juice. Tomato and spaghetti sauce. Chili peppers. Onion and garlic. Horseradish. Fruits Oranges, grapefruit, and lemon (fruit and juice). Meats High-fat meats, fish, and poultry. This includes hot dogs, ribs, ham, sausage, salami, and bacon. Dairy Whole milk and chocolate milk. Sour cream. Cream. Butter. Ice cream. Cream cheese.  Beverages Coffee and tea, with or without caffeine. Carbonated beverages or energy drinks. Condiments Hot sauce. Barbecue sauce.  Sweets/Desserts Chocolate and cocoa. Donuts. Peppermint and spearmint. Fats and Oils High-fat foods, including [place] fries and potato chips. Other Vinegar. Strong spices, such as black pepper, white pepper, red pepper, cayenne, curry powder, cloves, ginger, and chili powder. The items listed above may not be a complete list of foods and beverages to avoid. Contact your dietitian for more information.   This information is not intended to replace advice given to you by your health care provider. Make sure you discuss any questions you have with your health care provider.   Document Released: 09/08/2005 Document Revised: 09/29/2014 Document Reviewed: 07/13/2013 Elsevier Interactive Patient Education [employer].

## 2016-06-23 NOTE — Progress Notes (Signed)
Chief Complaint  Patient presents with  . Chest Pain    started yesterday and again this morning. Pain in her chest while eating. Having also some right shoulder blade pain as well. Biological father passed away at 70 of massive heart attack. Has been under lots of stress lately.    Last night she was eating sausage and grits for dinner (not spicy)--as she swallowed she developed discomfort in her mid chest. She later had a sharp pain in her right neck and shoulder blade. She was worried she could be having a heart attack.  The pain in the neck lasted a couple of hours. The pain in the chest was short-lived, only while swallowing/eating.  Still has slight discomfort in her right neck this morning.  No shortness of breath, tachycardia or palpitations with the chest pain, no nausea, vomiting.  She has been having some more heartburn recently (with cabbage, spaghetti), 6 times over the last 2 months.Hearburn is relieved by drinking some milk.  This morning, while eating toast, she developed recurrent discomfort in her lower chest.  Pain was only while she was eating, resolved now. Didn't feel like food was getting caught in her chest.    Stressors--mother in hospital for a month, school starting back (drives bus).  She is scheduled later this month for routine med check.  Cardiac risk factors include diabetes, hypertension, and FH early CAD in her father.  Lab Results  Component Value Date   CHOL 129 01/02/2016   HDL 34 (L) 01/02/2016   LDLCALC 78 01/02/2016   TRIG 85 01/02/2016   CHOLHDL 3.8 01/02/2016   Lab Results  Component Value Date   HGBA1C 6.3 01/02/2016    PMH, PSH, SH reviewed.  Current Outpatient Prescriptions on File Prior to Visit  Medication Sig Dispense Refill  . amphetamine-dextroamphetamine (ADDERALL) 20 MG tablet Take 1 tablet (20 mg total) by mouth See admin instructions. Take 1.5-2 tablets in the morning and 1 tablet in the evening, as directed 60 tablet 0  .  cholecalciferol (VITAMIN D) 1000 units tablet Take 1,000 Units by mouth daily. Reported on 02/25/2016    . Ferrous Sulfate (IRON) 325 (65 FE) MG TABS Take 1 tablet by mouth daily.     . fluticasone (FLONASE) 50 MCG/ACT nasal spray Place 2 sprays into both nostrils daily. 16 g 11  . losartan-hydrochlorothiazide (HYZAAR) 100-25 MG tablet TAKE 1 TABLET BY MOUTH DAILY 90 tablet 0  . metFORMIN (GLUCOPHAGE-XR) 500 MG 24 hr tablet Take 2 tablets (1,000 mg total) by mouth daily with breakfast. 180 tablet 1  . Multiple Vitamins-Minerals (MULTIVITAMIN WITH MINERALS) tablet Take 1 tablet by mouth daily. Reported on 01/02/2016     No current facility-administered medications on file prior to visit.    Allergies  Allergen Reactions  . Onglyza [Saxagliptin] Other (See Comments)    Eye irritation (when on Kombiglyze; tolerates metformin without problems)   ROS: no fever, chills, URI symptoms, GI or GU complaints, bleeding, bruising, rash, edema, shortness of breath or other concerns, except as noted in HPI.  PHYSICAL EXAM:  BP 128/80 (BP Location: Left Arm, Patient Position: Sitting, Cuff Size: Normal)   Pulse 72   Ht 5\' 5"  (1.651 m)   Wt 282 lb (127.9 kg)   LMP 05/10/2016 (Exact Date)   BMI 46.93 kg/m   Well developed, pleasant, obese female in no distress HEENT: conjunctiva and sclera are clear, OP clear Neck: no lymphadenopathy or mass. Nontender, FROM.  Area of discomfort is right  lateral neck (nontender to palpation) and lower trapezius muscle, no spasm noted. Heart: regular rate and rhythm Chest wall nontender Lungs: clear bilaterally Abdomen: mild epigastric tenderness. No organomegaly or mass Extremities: no edema Neuro: alert and oriented, normal strength, cranial nerves, gait Psych: normal mood, affect, hygiene and grooming   EKG: NSR rate 72, no acute abnormalities   ASSESSMENT/PLAN:  Chest pain, unspecified type - suspect GI--tender in epigastrium, occuring while eating.  discussed inflammation vs spasm. trial of PPI daily x 2 wks (start OTC, adjust dose if needed)  Need for prophylactic vaccination and inoculation against influenza - Plan: Flu Vaccine QUAD 36+ mos PF IM (Fluarix & Fluzone Quad PF)  Controlled type 2 diabetes mellitus without complication, without long-term current use of insulin ([place]) - controlled; recommended statin given DM and other risk factors for CAD. Risks/side effects reviewed - Plan: atorvastatin (LIPITOR) 10 MG tablet, aspirin 81 MG EC tablet  Essential hypertension, benign - well controlled  Family history of early CAD - daily low dose aspirin recommended due to multiple risk factors for heart disease.  Risks/side effects reviewed - Plan: aspirin 81 MG EC tablet  Gastroesophageal reflux disease, esophagitis presence not specified - Plan: omeprazole (PRILOSEC OTC) 20 MG tablet   Has med check 10/18  Start taking Prilosec OTC (or other store brand omeprazole 20mg , or Nexium 24 or Prevacid 15mg )--take one of these medications every day for 2 weeks. If you don't get any relief of symptoms, let me know.  We can discuss at your follow up the next step (prescription vs GI referral).  Contact me sooner if your symptoms are persisting or worsening, as I may change you to a prescription strength.  See handout for information on reflux.  Start taking the atorvastatin in the evening. If you develop side effects including leg cramps or muscle aches, start taking Coenzyme Q10 daily (this is over-the-counter).  If you are still having problems tolerating the statin, we will change it to a different on--let us know.  This is to lower the cholesterol (which was not high on last check)--it is recommended that everybody with diabetes be on a statin medication to reduce the risk of cardiovascular disease (heart attacks, strokes). It is also a good idea to take an enteric coated 81mg  of aspirin ("baby" aspirin) every day.  If this bothers your stomach, or  if you have excessive bleeding or bruising, you can stop taking this.

## 2016-07-08 NOTE — Progress Notes (Signed)
Chief Complaint  Patient presents with  . Diabetes    fasting med check.     She was seen 2 weeks ago with pain in her mid-chest that started while eating. She is currently taking OTC ranitidine once daily (unsure of dose), and hasn't had any further problems with chest pain.  ADD follow-up: She is not really using Adderall much at all right now, as she isn't currently in school.  It was very effective for her when in school, taking it on the days she had classes and heavy studying.  Denies side effects, except perhaps a little more trouble sleeping on the days she has to use it in the afternoon. She took afternoon pill on the days she had evening classes. She feels like the current dose worked well, and plans to restart when classes start again (either this Spring, or next Fall). She has some eye irritation during the week when taking Adderall, minimal.   Anemia--she is taking iron supplement now just around her menstrual cycles. Her periods over the last year have been lighter, only an occasional heavy period.  3 months ago was heavy, the last two months have been light. No hot flashes, some changes in her hair texture, dry and thinning--thinks she is starting to have some perimenopausal changes. Denies any significant change.  Thyroid has been normal  Lab Results  Component Value Date   WBC 8.4 01/02/2016   HGB 11.7 01/02/2016   HCT 35.6 01/02/2016   MCV 80.7 01/02/2016   PLT 349 01/02/2016    Diabetes--Sugars are 103-114 fasting, hasn't checked it in the evenings. Once saw 127 when she had eaten around midnight. Diet hasn't been as good since her mother has been in the hospital.  Eating fried chicken at [employer], at least 4x/week when she was in the hospital.  She was discharged yesterday, so diet should improve. Compliant with her medications, no side effects.Continues to avoid sodas.  Last eye exam was 06/2015, due now. She was started on atorvastatin at her visit 2 weeks ago (due  to multiple risk factors for CAD, including DM, HTN, FH early CAD).  She was also started on ECASA 81mg  at last visit. Denies GI side effects or bleeding. Labs prior to starting statin: Lab Results  Component Value Date   CHOL 129 01/02/2016   HDL 34 (L) 01/02/2016   LDLCALC 78 01/02/2016   TRIG 85 01/02/2016   CHOLHDL 3.8 01/02/2016   She developed a lot of pain in her legs after starting the statin, as well as her arms "whole body ached".  She took it for 3 days, then back down to every other, didn't help so she stopped taking it.  Never tried the coenzyme Q10 along with it. Pain resolved within 2 days of stopping the medication.  Obesity:  She gained 11.4 pounds in the last 6 months, being off her regular routine of diet/exercise since her mother has been in the hospital. Previously was going to the gym 2x/week, and walking at least once a week in the neighborhood.(at gym-- bicycle for 30-35 minutes, and 10 min on the elliptical).She doesn't get weight-bearing exercise.  Hypertension-- BP has been running 120's/86-93. Higher sodium intake due to fast foods over the last couple of months. Denies any headaches, chest pain (only as noted at last visit, resolved), palpitations, edema.  Vitamin D deficiency-She completed another 8 weeks of prescription vitamin D after April level was again low, at 24. She continues to take a multivitamin daily  plus OTC Vitamin D since then.  Remembers to take this at least 4-5 days/week.  Allergies:  Not flaring currently. Has been using her Flonase regularly, not needing an OTC antihistamine.  PMH, PSH, SH reviewed/updated  Outpatient Encounter Prescriptions as of 07/09/2016  Medication Sig Note  . aspirin 81 MG EC tablet Take 1 tablet (81 mg total) by mouth daily. Swallow whole, take with food   . atorvastatin (LIPITOR) 10 MG tablet Take 1 tablet (10 mg total) by mouth daily.   . cholecalciferol (VITAMIN D) 1000 units tablet Take 1,000 Units by mouth  daily. Reported on 02/25/2016   . Ferrous Sulfate (IRON) 325 (65 FE) MG TABS Take 1 tablet by mouth daily.  04/09/2015: Taking around her menstrual cycles only  . fluticasone (FLONASE) 50 MCG/ACT nasal spray Place 2 sprays into both nostrils daily.   [place] losartan-hydrochlorothiazide (HYZAAR) 100-25 MG tablet TAKE 1 TABLET BY MOUTH DAILY   . metFORMIN (GLUCOPHAGE-XR) 500 MG 24 hr tablet Take 2 tablets (1,000 mg total) by mouth daily with breakfast.   . Multiple Vitamins-Minerals (MULTIVITAMIN WITH MINERALS) tablet Take 1 tablet by mouth daily. Reported on 01/02/2016   . amphetamine-dextroamphetamine (ADDERALL) 20 MG tablet Take 1 tablet (20 mg total) by mouth See admin instructions. Take 1.5-2 tablets in the morning and 1 tablet in the evening, as directed (Patient not taking: Reported on 07/09/2016) 07/09/2016: Uses prn, not using much currently as she isn't in school.   . [DISCONTINUED] omeprazole (PRILOSEC OTC) 20 MG tablet Take 1 tablet (20 mg total) by mouth daily. (Patient not taking: Reported on 07/09/2016) 07/09/2016: Didn't get this, never took   No facility-administered encounter medications on file as of 07/09/2016.    Allergies  Allergen Reactions  . Onglyza [Saxagliptin] Other (See Comments)    Eye irritation (when on Kombiglyze; tolerates metformin without problems)    ROS: no fever, chills, URI symptoms, no further chest pain, no shortness of breath, edema.  No bleeding, bruising. Myalgias per HPI, resolved.  Some stress related to caring for her parents, but overall doing well. Denies nausea, vomiting, bowel changes. See HPI.  PHYSICAL EXAM:  BP (!) 150/90 (BP Location: Left Arm, Patient Position: Sitting, Cuff Size: Large)   Pulse 76   Ht 5\' 5"  (1.651 m)   Wt 282 lb (127.9 kg)   LMP 07/03/2016   BMI 46.93 kg/m   152/92 on repeat by MD  Well developed, pleasant, obese female in no distress HEENT: conjunctiva and sclera are clear, OP clear Neck: no lymphadenopathy or mass,  no carotid bruit Heart: regular rate and rhythm, no murmur Lungs: clear bilaterally Abdomen: Soft, nontender, nondistended.  No longer tender in epigastrium. No organomegaly or mass Extremities: no edema Neuro: alert and oriented, normal strength, cranial nerves, gait Psych: normal mood, affect, hygiene and grooming  Lab Results  Component Value Date   HGBA1C 6.7 07/09/2016   (up from 6.3 in April).   ASSESSMENT/PLAN:  Controlled type 2 diabetes mellitus without complication, without long-term current use of insulin ([facility]) - sugars higher since less compliant with diet/exercise.  Encouraged healthy diet, exercise, weight loss. Continue same meds - Plan: HgB A1c  Essential hypertension, benign - elevated today; not exercising, higher sodium intake. Monitor regularly and f/u if remains high  Gastroesophageal reflux disease, esophagitis presence not specified - no longer having chest pain since taking H2 blocker; continue.  Weight loss encouraged  Attention deficit disorder, unspecified hyperactivity presence - currently not in school; refills not needed.  Did well with last regimen--to call when refills needed, when back in school  Vitamin D deficiency - recheck level; encouraged daily vitamin (and can double up if forgotten for a day) - Plan: VITAMIN D 25 Hydroxy (Vit-D Deficiency, Fractures)  Dyslipidemia - intolerant of atorvastatin due to myalgias.  Will re-try with CoQ10--if still not tolerating, will call to change statin  - Plan: Lipid panel, Hepatic function panel  Will need lipids, liver in 8weeks (lab visit)--to bring list of BP's to her visit, and have BP check  Check CBC just yearly, unless symptomatic, since okay on last check  Re-try atorvastatin (lipitor), but take coenzyme Q10 along with it. This often times helps prevent the muscle aches that statins cause.  If you get recurrent pain despite the coenzyme Q10, contact me and we will send in a prescription for a  different statin (pravastatin 20mg ).  You can use the coenzyme Q10 along with this statin also.

## 2016-07-09 ENCOUNTER — Encounter: Payer: Self-pay | Admitting: Family Medicine

## 2016-07-09 ENCOUNTER — Ambulatory Visit (INDEPENDENT_AMBULATORY_CARE_PROVIDER_SITE_OTHER): Payer: BC Managed Care – PPO | Admitting: Family Medicine

## 2016-07-09 VITALS — BP 150/90 | HR 76 | Ht 65.0 in | Wt 282.0 lb

## 2016-07-09 DIAGNOSIS — E559 Vitamin D deficiency, unspecified: Secondary | ICD-10-CM

## 2016-07-09 DIAGNOSIS — K219 Gastro-esophageal reflux disease without esophagitis: Secondary | ICD-10-CM | POA: Diagnosis not present

## 2016-07-09 DIAGNOSIS — I10 Essential (primary) hypertension: Secondary | ICD-10-CM | POA: Diagnosis not present

## 2016-07-09 DIAGNOSIS — F988 Other specified behavioral and emotional disorders with onset usually occurring in childhood and adolescence: Secondary | ICD-10-CM

## 2016-07-09 DIAGNOSIS — E119 Type 2 diabetes mellitus without complications: Secondary | ICD-10-CM

## 2016-07-09 DIAGNOSIS — E785 Hyperlipidemia, unspecified: Secondary | ICD-10-CM | POA: Diagnosis not present

## 2016-07-09 LAB — POCT GLYCOSYLATED HEMOGLOBIN (HGB A1C): Hemoglobin A1C: 6.7

## 2016-07-09 NOTE — Patient Instructions (Addendum)
Re-try atorvastatin (lipitor), but take coenzyme Q10 along with it. This often times helps prevent the muscle aches that statins cause.  If you get recurrent pain despite the coenzyme Q10, contact me and we will send in a prescription for a different statin (pravastatin 20mg ).  You can use the coenzyme Q10 along with this statin also.  Please try and resume regular exercise routine and healthier diet.  Low sodium diet. If you need to eat fast foods, please try and make healthier choices (grilled chicken rather than fried, avoid fries, getting other sides instead).  Please check BP regularly, and bring list with you to your lab visit in 2 months. Contact us sooner if persistently running over 140/90.

## 2016-07-10 LAB — VITAMIN D 25 HYDROXY (VIT D DEFICIENCY, FRACTURES): VIT D 25 HYDROXY: 22 ng/mL — AB (ref 30–100)

## 2016-07-10 MED ORDER — VITAMIN D (ERGOCALCIFEROL) 1.25 MG (50000 UNIT) PO CAPS: 50000.0000 [IU] | ORAL_CAPSULE | ORAL | 0 refills | Status: DC

## 2016-07-10 NOTE — Addendum Note (Signed)
Addended by: Rita Ohara on: 07/10/2016 07:35 AM   Modules accepted: Orders

## 2016-09-01 ENCOUNTER — Encounter: Payer: Self-pay | Admitting: Family Medicine

## 2016-09-01 LAB — HM DIABETES EYE EXAM

## 2016-09-08 ENCOUNTER — Other Ambulatory Visit: Payer: BC Managed Care – PPO

## 2016-09-08 ENCOUNTER — Telehealth: Payer: Self-pay | Admitting: *Deleted

## 2016-09-08 DIAGNOSIS — E785 Hyperlipidemia, unspecified: Secondary | ICD-10-CM

## 2016-09-08 NOTE — Telephone Encounter (Signed)
Patient was in this morning for labs and bp check. Her bp reading was 130/80. I asked her what her readings were after I took her bp pressure. She said they have been ranging from 127-133/76-84.

## 2016-09-09 LAB — LIPID PANEL
CHOL/HDL RATIO: 4.4 ratio (ref <5.0)
Cholesterol: 133 mg/dL (ref <200)
HDL: 30 mg/dL — AB (ref >50)
LDL Cholesterol: 79 mg/dL (ref <100)
TRIGLYCERIDES: 119 mg/dL (ref <150)
VLDL: 24 mg/dL (ref <30)

## 2016-09-09 LAB — HEPATIC FUNCTION PANEL
ALBUMIN: 3.5 g/dL — AB (ref 3.6–5.1)
ALK PHOS: 51 U/L (ref 33–115)
ALT: 9 U/L (ref 6–29)
AST: 9 U/L — AB (ref 10–35)
Bilirubin, Direct: 0.1 mg/dL (ref <=0.2)
Indirect Bilirubin: 0.2 mg/dL (ref 0.2–1.2)
TOTAL PROTEIN: 6.7 g/dL (ref 6.1–8.1)
Total Bilirubin: 0.3 mg/dL (ref 0.2–1.2)

## 2016-09-13 ENCOUNTER — Other Ambulatory Visit: Payer: Self-pay | Admitting: Family Medicine

## 2016-09-14 ENCOUNTER — Other Ambulatory Visit: Payer: Self-pay | Admitting: Family Medicine

## 2016-11-04 ENCOUNTER — Other Ambulatory Visit: Payer: Self-pay | Admitting: Family Medicine

## 2016-11-04 DIAGNOSIS — Z1231 Encounter for screening mammogram for malignant neoplasm of breast: Secondary | ICD-10-CM

## 2016-11-13 ENCOUNTER — Telehealth: Payer: Self-pay | Admitting: Family Medicine

## 2016-11-13 DIAGNOSIS — F988 Other specified behavioral and emotional disorders with onset usually occurring in childhood and adolescence: Secondary | ICD-10-CM

## 2016-11-13 MED ORDER — AMPHETAMINE-DEXTROAMPHETAMINE 20 MG PO TABS: 20.0000 mg | ORAL_TABLET | ORAL | 0 refills | Status: DC

## 2016-11-13 NOTE — Telephone Encounter (Signed)
Advised pt that rx was ready for pick up

## 2016-11-13 NOTE — Telephone Encounter (Signed)
Advise pt rx ready

## 2016-11-13 NOTE — Telephone Encounter (Signed)
Pt called and needs a refill on her adderall pt can be reached at [phone] when ready to be picked up

## 2016-11-26 ENCOUNTER — Ambulatory Visit
Admission: RE | Admit: 2016-11-26 | Discharge: 2016-11-26 | Disposition: A | Discharge status: Home / Self Care | Payer: BC Managed Care – PPO | Source: Ambulatory Visit | Attending: Family Medicine | Admitting: Family Medicine

## 2016-11-26 DIAGNOSIS — Z1231 Encounter for screening mammogram for malignant neoplasm of breast: Secondary | ICD-10-CM

## 2016-12-13 ENCOUNTER — Other Ambulatory Visit: Payer: Self-pay | Admitting: Family Medicine
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
## 2017-02-02 ENCOUNTER — Ambulatory Visit (INDEPENDENT_AMBULATORY_CARE_PROVIDER_SITE_OTHER): Payer: BC Managed Care – PPO | Admitting: Family Medicine

## 2017-02-02 ENCOUNTER — Encounter: Payer: Self-pay | Admitting: Family Medicine

## 2017-02-02 VITALS — BP 124/78 | HR 84 | Ht 65.0 in | Wt 272.4 lb

## 2017-02-02 DIAGNOSIS — Z1211 Encounter for screening for malignant neoplasm of colon: Secondary | ICD-10-CM

## 2017-02-02 DIAGNOSIS — K625 Hemorrhage of anus and rectum: Secondary | ICD-10-CM | POA: Diagnosis not present

## 2017-02-02 DIAGNOSIS — F988 Other specified behavioral and emotional disorders with onset usually occurring in childhood and adolescence: Secondary | ICD-10-CM

## 2017-02-02 DIAGNOSIS — D509 Iron deficiency anemia, unspecified: Secondary | ICD-10-CM

## 2017-02-02 DIAGNOSIS — E119 Type 2 diabetes mellitus without complications: Secondary | ICD-10-CM

## 2017-02-02 DIAGNOSIS — E559 Vitamin D deficiency, unspecified: Secondary | ICD-10-CM

## 2017-02-02 DIAGNOSIS — E785 Hyperlipidemia, unspecified: Secondary | ICD-10-CM

## 2017-02-02 DIAGNOSIS — Z0000 Encounter for general adult medical examination without abnormal findings: Secondary | ICD-10-CM

## 2017-02-02 DIAGNOSIS — I10 Essential (primary) hypertension: Secondary | ICD-10-CM | POA: Diagnosis not present

## 2017-02-02 LAB — CBC WITH DIFFERENTIAL/PLATELET
BASOS ABS: 0 {cells}/uL (ref 0–200)
Basophils Relative: 0 %
EOS PCT: 2 %
Eosinophils Absolute: 158 cells/uL (ref 15–500)
HCT: 35.5 % (ref 35.0–45.0)
HEMOGLOBIN: 11.3 g/dL — AB (ref 11.7–15.5)
Lymphocytes Relative: 27 %
Lymphs Abs: 2133 cells/uL (ref 850–3900)
MCH: 24.9 pg — AB (ref 27.0–33.0)
MCHC: 31.8 g/dL — AB (ref 32.0–36.0)
MCV: 78.4 fL — ABNORMAL LOW (ref 80.0–100.0)
MONOS PCT: 4 %
MPV: 10.1 fL (ref 7.5–12.5)
Monocytes Absolute: 316 cells/uL (ref 200–950)
NEUTROS PCT: 67 %
Neutro Abs: 5293 cells/uL (ref 1500–7800)
PLATELETS: 359 10*3/uL (ref 140–400)
RBC: 4.53 MIL/uL (ref 3.80–5.10)
RDW: 16.5 % — ABNORMAL HIGH (ref 11.0–15.0)
WBC: 7.9 10*3/uL (ref 4.0–10.5)

## 2017-02-02 LAB — POCT URINALYSIS DIPSTICK
Bilirubin, UA: NEGATIVE
Blood, UA: NEGATIVE
Glucose, UA: NEGATIVE
Ketones, UA: NEGATIVE
LEUKOCYTES UA: NEGATIVE
Nitrite, UA: NEGATIVE
PROTEIN UA: NEGATIVE
Spec Grav, UA: 1.025 (ref 1.010–1.025)
UROBILINOGEN UA: NEGATIVE U/dL — AB
pH, UA: 6 (ref 5.0–8.0)

## 2017-02-02 LAB — POCT GLYCOSYLATED HEMOGLOBIN (HGB A1C): Hemoglobin A1C: 6.3

## 2017-02-02 LAB — TSH: TSH: 1.23 m[IU]/L

## 2017-02-02 MED ORDER — ROSUVASTATIN CALCIUM 5 MG PO TABS: 5.0000 mg | ORAL_TABLET | Freq: Every day | ORAL | 2 refills | Status: DC

## 2017-02-02 NOTE — Patient Instructions (Signed)
  HEALTH MAINTENANCE RECOMMENDATIONS:  It is recommended that you get at least 30 minutes of aerobic exercise at least 5 days/week (for weight loss, you may need as much as 60-90 minutes). This can be any activity that gets your heart rate up. This can be divided in 10-15 minute intervals if needed, but try and build up your endurance at least once a week.  Weight bearing exercise is also recommended twice weekly.  Eat a healthy diet with lots of vegetables, fruits and fiber.  "Colorful" foods have a lot of vitamins (ie green vegetables, tomatoes, red peppers, etc).  Limit sweet tea, regular sodas and alcoholic beverages, all of which has a lot of calories and sugar.  Up to 1 alcoholic drink daily may be beneficial for women (unless trying to lose weight, watch sugars).  Drink a lot of water.  Calcium recommendations are 1200-1500 mg daily (1500 mg for postmenopausal women or women without ovaries), and vitamin D 1000 IU daily.  This should be obtained from diet and/or supplements (vitamins), and calcium should not be taken all at once, but in divided doses.  Monthly self breast exams and yearly mammograms for women over the age of 52 is recommended.  Sunscreen of at least SPF 30 should be used on all sun-exposed parts of the skin when outside between the hours of 10 am and 4 pm (not just when at beach or pool, but even with exercise, golf, tennis, and yard work!)  Use a sunscreen that says "broad spectrum" so it covers both UVA and UVB rays, and make sure to reapply every 1-2 hours.  Remember to change the batteries in your smoke detectors when changing your clock times in the spring and fall.  Use your seat belt every time you are in a car, and please drive safely and not be distracted with cell phones and texting while driving.  Continue with weight loss.  Try cutting back on juices and eliminating regular soda. Start the Coenzyme Q10 alone for a few days to ensure no side effects, and if none,  continue that daily and then start the Crestor (rosuvastatin) once daily.  This is a low dose and hopefully will be well tolerated.  If not, you can cut back to every other day as a trial.  Please let us know if you have any issues with side effects.

## 2017-02-03 LAB — MICROALBUMIN / CREATININE URINE RATIO
Creatinine, Urine: 78 mg/dL (ref 20–320)
MICROALB UR: 0.3 mg/dL
Microalb Creat Ratio: 4 mcg/mg creat (ref <30)

## 2017-02-03 LAB — COMPREHENSIVE METABOLIC PANEL
ALK PHOS: 55 U/L (ref 33–115)
ALT: 9 U/L (ref 6–29)
AST: 11 U/L (ref 10–35)
Albumin: 3.9 g/dL (ref 3.6–5.1)
BILIRUBIN TOTAL: 0.5 mg/dL (ref 0.2–1.2)
BUN: 10 mg/dL (ref 7–25)
CO2: 26 mmol/L (ref 20–31)
Calcium: 9.3 mg/dL (ref 8.6–10.2)
Chloride: 100 mmol/L (ref 98–110)
Creat: 0.87 mg/dL (ref 0.50–1.10)
GLUCOSE: 107 mg/dL — AB (ref 65–99)
Potassium: 3.5 mmol/L (ref 3.5–5.3)
Sodium: 137 mmol/L (ref 135–146)
Total Protein: 7.1 g/dL (ref 6.1–8.1)

## 2017-02-03 LAB — VITAMIN D 25 HYDROXY (VIT D DEFICIENCY, FRACTURES): VIT D 25 HYDROXY: 43 ng/mL (ref 30–100)

## 2017-02-03 NOTE — Addendum Note (Signed)
Addended byRita Ohara on: 02/03/2017 12:23 PM   Modules accepted: Orders

## 2017-02-06 ENCOUNTER — Encounter: Payer: Self-pay | Admitting: Gastroenterology

## 2017-03-10 ENCOUNTER — Ambulatory Visit (INDEPENDENT_AMBULATORY_CARE_PROVIDER_SITE_OTHER): Payer: BC Managed Care – PPO | Admitting: Gastroenterology

## 2017-03-10 ENCOUNTER — Encounter: Payer: Self-pay | Admitting: Gastroenterology

## 2017-03-10 VITALS — BP 102/70 | HR 64 | Ht 65.0 in | Wt 270.0 lb

## 2017-03-10 DIAGNOSIS — D509 Iron deficiency anemia, unspecified: Secondary | ICD-10-CM

## 2017-03-10 DIAGNOSIS — K625 Hemorrhage of anus and rectum: Secondary | ICD-10-CM | POA: Diagnosis not present

## 2017-03-10 MED ORDER — NA SULFATE-K SULFATE-MG SULF 17.5-3.13-1.6 GM/177ML PO SOLN: 1.0000 | Freq: Once | ORAL | 0 refills | Status: AC
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
## 2017-03-10 NOTE — Patient Instructions (Addendum)
If you are age 49 or older, your body mass index should be between 23-30. Your Body mass index is 44.93 kg/m. If this is out of the aforementioned range listed, please consider follow up with your Primary Care Provider.  If you are age 75 or younger, your body mass index should be between 19-25. Your Body mass index is 44.93 kg/m. If this is out of the aformentioned range listed, please consider follow up with your Primary Care Provider.   We have sent the following medications to your pharmacy for you to pick up at your convenience:  Suprep  Please purchase Citrucel over the counter and take daily.  You have been scheduled for an endoscopy and colonoscopy. Please follow the written instructions given to you at your visit today. Please pick up your prep supplies at the pharmacy within the next 1-3 days. If you use inhalers (even only as needed), please bring them with you on the day of your procedure. Your physician has requested that you go to www.startemmi.com and enter the access code given to you at your visit today. This web site gives a general overview about your procedure. However, you should still follow specific instructions given to you by our office regarding your preparation for the procedure.  Thank you.

## 2017-03-27 ENCOUNTER — Other Ambulatory Visit: Payer: Self-pay | Admitting: Family Medicine

## 2017-04-22 ENCOUNTER — Encounter: Payer: Self-pay | Admitting: Gastroenterology

## 2017-04-22 DIAGNOSIS — K5790 Diverticulosis of intestine, part unspecified, without perforation or abscess without bleeding: Secondary | ICD-10-CM

## 2017-04-22 DIAGNOSIS — K648 Other hemorrhoids: Secondary | ICD-10-CM

## 2017-04-22 HISTORY — DX: Other hemorrhoids: K64.8

## 2017-04-22 HISTORY — DX: Diverticulosis of intestine, part unspecified, without perforation or abscess without bleeding: K57.90

## 2017-05-05 ENCOUNTER — Encounter: Payer: Self-pay | Admitting: Gastroenterology

## 2017-05-05 ENCOUNTER — Ambulatory Visit (AMBULATORY_SURGERY_CENTER): Payer: BC Managed Care – PPO | Admitting: Gastroenterology

## 2017-05-05 VITALS — BP 112/74 | HR 74 | Temp 96.6°F | Resp 11 | Ht 65.0 in | Wt 270.0 lb

## 2017-05-05 DIAGNOSIS — K2950 Unspecified chronic gastritis without bleeding: Secondary | ICD-10-CM | POA: Diagnosis not present

## 2017-05-05 DIAGNOSIS — K625 Hemorrhage of anus and rectum: Secondary | ICD-10-CM

## 2017-05-05 DIAGNOSIS — D508 Other iron deficiency anemias: Secondary | ICD-10-CM

## 2017-05-05 MED ORDER — SODIUM CHLORIDE 0.9 % IV SOLN: 500.0000 mL | INTRAVENOUS | Status: DC

## 2017-05-05 NOTE — Progress Notes (Signed)
Called to room to assist during endoscopic procedure.  Patient ID and intended procedure confirmed with present staff. Received instructions for my participation in the procedure from the performing physician.  

## 2017-05-05 NOTE — Op Note (Signed)
[place] Patient Name: Crystal [name] Procedure Date: 05/05/2017 2:00 PM MRN: [id number] Endoscopist: Remo Lipps P. Diamantina Edinger MD, MD Age: 51 Referring MD:  Date of Birth: 1965/10/29 Gender: Female Account #: [id number] Procedure:                Upper GI endoscopy Indications:              Iron deficiency anemia Medicines:                Monitored Anesthesia Care Procedure:                Pre-Anesthesia Assessment:                           - Prior to the procedure, a History and Physical                            was performed, and patient medications and                            allergies were reviewed. The patient's tolerance of                            previous anesthesia was also reviewed. The risks                            and benefits of the procedure and the sedation                            options and risks were discussed with the patient.                            All questions were answered, and informed consent                            was obtained. Prior Anticoagulants: The patient has                            taken no previous anticoagulant or antiplatelet                            agents. ASA Grade Assessment: III - A patient with                            severe systemic disease. After reviewing the risks                            and benefits, the patient was deemed in                            satisfactory condition to undergo the procedure.                           After obtaining informed consent, the endoscope was  passed under direct vision. Throughout the                            procedure, the patient's blood pressure, pulse, and                            oxygen saturations were monitored continuously. The                            Endoscope was introduced through the mouth, and                            advanced to the second part of duodenum. The upper                            GI endoscopy was  accomplished without difficulty.                            The patient tolerated the procedure well. Scope In: Scope Out: Findings:                 The Z-line was regular.                           The exam of the esophagus was otherwise normal.                           The entire examined stomach was normal. Biopsies                            were taken with a cold forceps for Helicobacter                            pylori testing.                           There were some mild patchy changes in the duodenum                            concerning for mild scalloping versus normal                            variant. The remainder of the examined duodenum was                            normal. Biopsies for histology were taken with a                            cold forceps for evaluation of celiac disease. Complications:            No immediate complications. Estimated blood loss:                            Minimal. Estimated Blood Loss:     Estimated blood loss was minimal. Impression:               -  Z-line regular.                           - Normal esophagus                           - Normal stomach. Biopsied to ensure no evidence of                            celiac disease given iron deficiency.                           - Suspect normal variant changes described above in                            the duodenum, biopsied to ensure no evidence of                            celiac disease.                           Overall, pending no changes on pathology to account                            for the anemia, I suspect iron deficiency is most                            likely due to menstrual blood loss. Recommendation:           - Patient has a contact number available for                            emergencies. The signs and symptoms of potential                            delayed complications were discussed with the                            patient. Return to normal  activities tomorrow.                            Written discharge instructions were provided to the                            patient.                           - Resume previous diet.                           - Continue present medications.                           - Await pathology results. Remo Lipps P. Griff Badley MD, MD 05/05/2017 2:49:06 PM This report has been signed electronically.

## 2017-05-05 NOTE — Progress Notes (Signed)
A/ox3 pleased with MAC, report to Megan RN 

## 2017-05-05 NOTE — Op Note (Signed)
[facility] Patient Name: Crystal [name] Procedure Date: 05/05/2017 2:00 PM MRN: [id number] Endoscopist: Remo Lipps P. Lindy Garczynski MD, MD Age: 51 Referring MD:  Date of Birth: 20-Nov-1965 Gender: Female Account #: [id number] Procedure:                Colonoscopy Indications:              This is the patient's first colonoscopy, Iron                            deficiency anemia Medicines:                Monitored Anesthesia Care Procedure:                Pre-Anesthesia Assessment:                           - Prior to the procedure, a History and Physical                            was performed, and patient medications and                            allergies were reviewed. The patient's tolerance of                            previous anesthesia was also reviewed. The risks                            and benefits of the procedure and the sedation                            options and risks were discussed with the patient.                            All questions were answered, and informed consent                            was obtained. Prior Anticoagulants: The patient has                            taken no previous anticoagulant or antiplatelet                            agents. ASA Grade Assessment: III - A patient with                            severe systemic disease. After reviewing the risks                            and benefits, the patient was deemed in                            satisfactory condition to undergo the procedure.  After obtaining informed consent, the colonoscope                            was passed under direct vision. Throughout the                            procedure, the patient's blood pressure, pulse, and                            oxygen saturations were monitored continuously. The                            Colonoscope was introduced through the anus and                            advanced to the the terminal ileum,  with                            identification of the appendiceal orifice and IC                            valve. The colonoscopy was performed without                            difficulty. The patient tolerated the procedure                            well. The quality of the bowel preparation was                            good. The terminal ileum, ileocecal valve,                            appendiceal orifice, and rectum were photographed. Scope In: 2:16:55 PM Scope Out: 2:39:03 PM Scope Withdrawal Time: 0 hours 16 minutes 15 seconds  Total Procedure Duration: 0 hours 22 minutes 8 seconds  Findings:                 The perianal and digital rectal examinations were                            normal.                           The terminal ileum appeared normal.                           Multiple small and large-mouthed diverticula were                            found in the entire colon.                           Internal hemorrhoids were found during  retroflexion. The hemorrhoids were small.                           The sigmoid colon was redundant. The exam was                            otherwise without abnormality. Complications:            No immediate complications. Estimated blood loss:                            None. Estimated Blood Loss:     Estimated blood loss: none. Impression:               - The examined portion of the ileum was normal.                           - Diverticulosis in the entire examined colon.                           - Internal hemorrhoids.                           - Redundant sigmoid colon.                           - The examination was otherwise normal.                           - No specimens collected.                           No cause for iron deficiency on this exam, which                            may be due to menorrhagia. Recommendation:           - Patient has a contact number available for                             emergencies. The signs and symptoms of potential                            delayed complications were discussed with the                            patient. Return to normal activities tomorrow.                            Written discharge instructions were provided to the                            patient.                           - Resume previous diet.                           -  Continue present medications.                           - Repeat colonoscopy in 10 years for screening                            purposes. Remo Lipps P. Crystal Cervi MD, MD 05/05/2017 2:43:33 PM This report has been signed electronically.

## 2017-05-05 NOTE — Patient Instructions (Signed)
**  Handout given on Diverticulosis**   YOU HAD AN ENDOSCOPIC PROCEDURE TODAY: Refer to the procedure report and other information in the discharge instructions given to you for any specific questions about what was found during the examination. If this information does not answer your questions, please call [place] office at [phone] to clarify.   YOU SHOULD EXPECT: Some feelings of bloating in the abdomen. Passage of more gas than usual. Walking can help get rid of the air that was put into your GI tract during the procedure and reduce the bloating. If you had a lower endoscopy (such as a colonoscopy or flexible sigmoidoscopy) you may notice spotting of blood in your stool or on the toilet paper. Some abdominal soreness may be present for a day or two, also.  DIET: Your first meal following the procedure should be a light meal and then it is ok to progress to your normal diet. A half-sandwich or bowl of soup is an example of a good first meal. Heavy or fried foods are harder to digest and may make you feel nauseous or bloated. Drink plenty of fluids but you should avoid alcoholic beverages for 24 hours. If you had a esophageal dilation, please see attached instructions for diet.    ACTIVITY: Your care partner should take you home directly after the procedure. You should plan to take it easy, moving slowly for the rest of the day. You can resume normal activity the day after the procedure however YOU SHOULD NOT DRIVE, use power tools, machinery or perform tasks that involve climbing or major physical exertion for 24 hours (because of the sedation medicines used during the test).   SYMPTOMS TO REPORT IMMEDIATELY: A gastroenterologist can be reached at any hour. Please call [phone]  for any of the following symptoms:  Following lower endoscopy (colonoscopy, flexible sigmoidoscopy) Excessive amounts of blood in the stool  Significant tenderness, worsening of abdominal pains  Swelling of the  abdomen that is new, acute  Fever of 100 or higher  Following upper endoscopy (EGD, EUS, ERCP, esophageal dilation) Vomiting of blood or coffee ground material  New, significant abdominal pain  New, significant chest pain or pain under the shoulder blades  Painful or persistently difficult swallowing  New shortness of breath  Black, tarry-looking or red, bloody stools  FOLLOW UP:  If any biopsies were taken you will be contacted by phone or by letter within the next 1-3 weeks. Call [phone]  if you have not heard about the biopsies in 3 weeks.  Please also call with any specific questions about appointments or follow up tests.

## 2017-05-06 ENCOUNTER — Telehealth: Payer: Self-pay

## 2017-05-06 NOTE — Telephone Encounter (Signed)
  Follow up Call-  Call back number 05/05/2017  Post procedure Call Back phone  # [phone]  Permission to leave phone message Yes  Some recent data might be hidden     Patient questions:  Do you have a fever, pain , or abdominal swelling? No. Pain Score  0 *  Have you tolerated food without any problems? Yes.    Have you been able to return to your normal activities? Yes.    Do you have any questions about your discharge instructions: Diet   No. Medications  No. Follow up visit  No.  Do you have questions or concerns about your Care? No.  Actions: * If pain score is 4 or above: No action needed, pain <4.

## 2017-05-07 NOTE — Progress Notes (Signed)
Chief Complaint  Patient presents with  . Follow-up    colonoscopy   Patient presents as follow-up on endoscopies.  Not exactly sure why this was scheduled.  She is due for fasting lipid recheck, but she is not fasting today.  Chart was reviewed: EGD--normal.  Biopsy taken. Pathology is still pending Colonoscopy--notable for internal hemorrhoids and diverticulosis. Gastroenterologist did not see a GI source for iron deficiency, suspects related to menstrual losses. She reports that her menses have been much lighter and shorter over the last year. She is taking iron every other day.  Her anemia had improved on last check (still mildly anemic)  Lab Results  Component Value Date   WBC 7.9 02/02/2017   HGB 11.3 (L) 02/02/2017   HCT 35.5 02/02/2017   MCV 78.4 (L) 02/02/2017   PLT 359 02/02/2017   Hyperlipidemia/diabetic: She was started on Crestor 5mg  in May. She had been intolerant of atorvastatin due to myalgias. She is tolerating the low dose Crestor without myalgias. She feels like she is slightly more forgetful.  She read this could happen, and isn't sure if she notices this more just because she read it. She had  a hard time giving an example, other than that she thought her appointment was 8:45 when it was 9:30 (came very early today).  She also recalls that she walked out of the house with mismatched shoes twice last week.  She has ADD, but not taken her ADD medications since May (due to being out of school), which may be a contributing factor.  She is a diabetic. She is due for fasting labs (had future orders in system for lab visit)--she is not fasting today. Lipids prior to treatment: Lab Results  Component Value Date   CHOL 133 09/08/2016   HDL 30 (L) 09/08/2016   LDLCALC 79 09/08/2016   TRIG 119 09/08/2016   CHOLHDL 4.4 09/08/2016   ADHD:  Not currently in classes.  Will be taking a bible class on Monday nights.   She feels relaxed, unstressed over the summer. She is  requesting refill of meds.  PMH, PSH, SH reviewed  Outpatient Encounter Prescriptions as of 05/08/2017  Medication Sig Note  . aspirin 81 MG EC tablet Take 1 tablet (81 mg total) by mouth daily. Swallow whole, take with food   . Cholecalciferol (VITAMIN D) 2000 units tablet Take 2,000 Units by mouth daily.   . Ferrous Sulfate (IRON) 325 (65 FE) MG TABS Take 1 tablet by mouth daily.  02/02/2017: Taking every other day (for the last 3 weeks, was taking around cycles only prior to that)  . fluticasone (FLONASE) 50 MCG/ACT nasal spray Place 2 sprays into both nostrils daily.   Marland Kitchen losartan-hydrochlorothiazide (HYZAAR) 100-25 MG tablet TAKE 1 TABLET BY MOUTH DAILY   . metFORMIN (GLUCOPHAGE-XR) 500 MG 24 hr tablet TAKE 2 TABLETS BY MOUTH EVERY DAY WITH BREAKFAST   . Multiple Vitamins-Minerals (MULTIVITAMIN WITH MINERALS) tablet Take 1 tablet by mouth daily. Reported on 01/02/2016   . rosuvastatin (CRESTOR) 5 MG tablet Take 1 tablet (5 mg total) by mouth daily.   Marland Kitchen amphetamine-dextroamphetamine (ADDERALL) 20 MG tablet Take 1 tablet (20 mg total) by mouth See admin instructions. Take 1.5-2 tablets in the morning and 1 tablet in the evening, as directed   . [DISCONTINUED] amphetamine-dextroamphetamine (ADDERALL) 20 MG tablet Take 1 tablet (20 mg total) by mouth See admin instructions. Take 1.5-2 tablets in the morning and 1 tablet in the evening, as directed (Patient not taking:  Reported on 05/08/2017)    Facility-Administered Encounter Medications as of 05/08/2017  Medication  . 0.9 %  sodium chloride infusion   Allergies  Allergen Reactions  . Onglyza [Saxagliptin] Other (See Comments)    Eye irritation (when on [place]; tolerates metformin without problems)  Patient denies allergy to this product. States that it was another medication    ROS: no fever, chills, headaches, dizziness, syncope, URI symptoms, chest pain, shortness of breath, GI or GU complaints.  Menses are lighter.  She denies bleeding,  bruising, rash. Denies depression, anxiety. Denies myalgias or joint pains.  Trouble with memory per HPI.  PHYSICAL EXAM: BP 112/80   Pulse 64   Resp 18   Ht 5' 4.5" (1.638 m)   Wt 276 lb 6.4 oz (125.4 kg)   LMP 05/01/2017 (Exact Date)   SpO2 98%   BMI 46.71 kg/m   Well appearing, pleasant obese female in no distress HEENT: conjunctiva and sclera are clear, EOMI Neck: no lymphadenopathy, thyromegaly or mass Heart: regular rate and rhythm, no murmur Lungs: clear bilaterally Abdomen: soft, nontender, no organomegaly or mass Extremities: no edema, normal pulses Psych: normal mood, affect, hygiene and grooming.  Normal attention, no hyperactivity.  ASSESSMENT/PLAN:  Iron deficiency anemia, unspecified iron deficiency anemia type - improved, no GI source found. Continue iron  Attention deficit disorder, unspecified hyperactivity presence - refill for when she needs it (classes); possibly not taking meds is contributing to her concentration/memory issues - Plan: amphetamine-dextroamphetamine (ADDERALL) 20 MG tablet  Internal hemorrhoids - not currently bleeding.  Reviewed recommendations/treatments  Diverticulosis of intestine without bleeding, unspecified intestinal tract location - discussed high fiber diet  Controlled type 2 diabetes mellitus without complication, without long-term current use of insulin (HCC)  Dyslipidemia - not fasting today. Tolerating Crestor.  Return for fasting labs.   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  Discussed/counseled re: potential side effects from statins. She may (after labs drawn) try stopping it for 1-2 weeks to see if memory issues resolve, and re-challenge after. Discussed internal hemorrhoids and diverticulosis.  Return for fasting labs and f/u as scheduled in Nov

## 2017-05-08 ENCOUNTER — Ambulatory Visit (INDEPENDENT_AMBULATORY_CARE_PROVIDER_SITE_OTHER): Payer: BC Managed Care – PPO | Admitting: Family Medicine

## 2017-05-08 ENCOUNTER — Encounter: Payer: Self-pay | Admitting: Family Medicine

## 2017-05-08 VITALS — BP 112/80 | HR 64 | Resp 18 | Ht 64.5 in | Wt 276.4 lb

## 2017-05-08 DIAGNOSIS — E119 Type 2 diabetes mellitus without complications: Secondary | ICD-10-CM | POA: Diagnosis not present

## 2017-05-08 DIAGNOSIS — E785 Hyperlipidemia, unspecified: Secondary | ICD-10-CM

## 2017-05-08 DIAGNOSIS — K5790 Diverticulosis of intestine, part unspecified, without perforation or abscess without bleeding: Secondary | ICD-10-CM | POA: Diagnosis not present

## 2017-05-08 DIAGNOSIS — K648 Other hemorrhoids: Secondary | ICD-10-CM

## 2017-05-08 DIAGNOSIS — D509 Iron deficiency anemia, unspecified: Secondary | ICD-10-CM

## 2017-05-08 DIAGNOSIS — F988 Other specified behavioral and emotional disorders with onset usually occurring in childhood and adolescence: Secondary | ICD-10-CM

## 2017-05-08 MED ORDER — AMPHETAMINE-DEXTROAMPHETAMINE 20 MG PO TABS: 20.0000 mg | ORAL_TABLET | ORAL | 0 refills | Status: DC

## 2017-05-08 NOTE — Patient Instructions (Signed)
High-Fiber Diet Fiber, also called dietary fiber, is a type of carbohydrate found in fruits, vegetables, whole grains, and beans. A high-fiber diet can have many health benefits. Your health care provider may recommend a high-fiber diet to help: Prevent constipation. Fiber can make your bowel movements more regular. Lower your cholesterol. Relieve hemorrhoids, uncomplicated diverticulosis, or irritable bowel syndrome. Prevent overeating as part of a weight-loss plan. Prevent heart disease, type 2 diabetes, and certain cancers.  What is my plan? The recommended daily intake of fiber includes: 38 grams for men under age 68. 49 grams for men over age 49. 38 grams for women under age 26. 57 grams for women over age 77.  You can get the recommended daily intake of dietary fiber by eating a variety of fruits, vegetables, grains, and beans. Your health care provider may also recommend a fiber supplement if it is not possible to get enough fiber through your diet. What do I need to know about a high-fiber diet? Fiber supplements have not been widely studied for their effectiveness, so it is better to get fiber through food sources. Always check the fiber content on thenutrition facts label of any prepackaged food. Look for foods that contain at least 5 grams of fiber per serving. Ask your dietitian if you have questions about specific foods that are related to your condition, especially if those foods are not listed in the following section. Increase your daily fiber consumption gradually. Increasing your intake of dietary fiber too quickly may cause bloating, cramping, or gas. Drink plenty of water. Water helps you to digest fiber. What foods can I eat? Grains Whole-grain breads. Multigrain cereal. Oats and oatmeal. Brown rice. Barley. Bulgur wheat. [place]. Bran muffins. Popcorn. Rye wafer crackers. Vegetables Sweet potatoes. Spinach. Kale. Artichokes. Cabbage. Broccoli. Green peas. Carrots.  Squash. Fruits Berries. Pears. Apples. Oranges. Avocados. Prunes and raisins. Dried figs. Meats and Other Protein Sources [place], kidney, pinto, and soy beans. Split peas. Lentils. Nuts and seeds. Dairy Fiber-fortified yogurt. Beverages Fiber-fortified soy milk. Fiber-fortified orange juice. Other Fiber bars. The items listed above may not be a complete list of recommended foods or beverages. Contact your dietitian for more options. What foods are not recommended? Grains White bread. Pasta made with refined flour. White rice. Vegetables Fried potatoes. Canned vegetables. Well-cooked vegetables. Fruits Fruit juice. Cooked, strained fruit. Meats and Other Protein Sources Fatty cuts of meat. Fried Sales executive or fried fish. Dairy Milk. Yogurt. Cream cheese. Sour cream. Beverages Soft drinks. Other Cakes and pastries. Butter and oils. The items listed above may not be a complete list of foods and beverages to avoid. Contact your dietitian for more information. What are some tips for including high-fiber foods in my diet? Eat a wide variety of high-fiber foods. Make sure that half of all grains consumed each day are whole grains. Replace breads and cereals made from refined flour or white flour with whole-grain breads and cereals. Replace white rice with brown rice, bulgur wheat, or millet. Start the day with a breakfast that is high in fiber, such as a cereal that contains at least 5 grams of fiber per serving. Use beans in place of meat in soups, salads, or pasta. Eat high-fiber snacks, such as berries, raw vegetables, nuts, or popcorn. This information is not intended to replace advice given to you by your health care provider. Make sure you discuss any questions you have with your health care provider. Document Released: 09/08/2005 Document Revised: 02/14/2016 Document Reviewed: 02/21/2014 Elsevier Interactive Patient Education  2017 [employer] Diverticulosis Diverticulosis is a  condition that develops when small pouches (diverticula) form in the wall of the large intestine (colon). The colon is where water is absorbed and stool is formed. The pouches form when the inside layer of the colon pushes through weak spots in the outer layers of the colon. You may have a few pouches or many of them. What are the causes? The cause of this condition is not known. What increases the risk? The following factors may make you more likely to develop this condition: Being older than age 97. Your risk for this condition increases with age. Diverticulosis is rare among people younger than age 44. By age 66, many people have it. Eating a low-fiber diet. Having frequent constipation. Being overweight. Not getting enough exercise. Smoking. Taking over-the-counter pain medicines, like aspirin and ibuprofen. Having a family history of diverticulosis.  What are the signs or symptoms? In most people, there are no symptoms of this condition. If you do have symptoms, they may include: Bloating. Cramps in the abdomen. Constipation or diarrhea. Pain in the lower left side of the abdomen.  How is this diagnosed? This condition is most often diagnosed during an exam for other colon problems. Because diverticulosis usually has no symptoms, it often cannot be diagnosed independently. This condition may be diagnosed by: Using a flexible scope to examine the colon (colonoscopy). Taking an X-ray of the colon after dye has been put into the colon (barium enema). Doing a CT scan.  How is this treated? You may not need treatment for this condition if you have never developed an infection related to diverticulosis. If you have had an infection before, treatment may include: Eating a high-fiber diet. This may include eating more fruits, vegetables, and grains. Taking a fiber supplement. Taking a live bacteria supplement (probiotic). Taking medicine to relax your colon. Taking antibiotic  medicines.  Follow these instructions at home: Drink 6-8 glasses of water or more each day to prevent constipation. Try not to strain when you have a bowel movement. If you have had an infection before: Eat more fiber as directed by your health care provider or your diet and nutrition specialist (dietitian). Take a fiber supplement or probiotic, if your health care provider approves. Take over-the-counter and prescription medicines only as told by your health care provider. If you were prescribed an antibiotic, take it as told by your health care provider. Do not stop taking the antibiotic even if you start to feel better. Keep all follow-up visits as told by your health care provider. This is important. Contact a health care provider if: You have pain in your abdomen. You have bloating. You have cramps. You have not had a bowel movement in 3 days. Get help right away if: Your pain gets worse. Your bloating becomes very bad. You have a fever or chills, and your symptoms suddenly get worse. You vomit. You have bowel movements that are bloody or black. You have bleeding from your rectum. Summary Diverticulosis is a condition that develops when small pouches (diverticula) form in the wall of the large intestine (colon). You may have a few pouches or many of them. This condition is most often diagnosed during an exam for other colon problems. If you have had an infection related to diverticulosis, treatment may include increasing the fiber in your diet, taking supplements, or taking medicines. This information is not intended to replace advice given to you by your health care provider. Make sure you discuss  any questions you have with your health care provider. Document Released: 06/05/2004 Document Revised: 07/28/2016 Document Reviewed: 07/28/2016 Elsevier Interactive Patient Education  2017 [place]. Hemorrhoids Hemorrhoids are swollen veins in and around the rectum or anus. There  are two types of hemorrhoids:  Internal hemorrhoids. These occur in the veins that are just inside the rectum. They may poke through to the outside and become irritated and painful.  External hemorrhoids. These occur in the veins that are outside of the anus and can be felt as a painful swelling or hard lump near the anus.  Most hemorrhoids do not cause serious problems, and they can be managed with home treatments such as diet and lifestyle changes. If home treatments do not help your symptoms, procedures can be done to shrink or remove the hemorrhoids. What are the causes? This condition is caused by increased pressure in the anal area. This pressure may result from various things, including:  Constipation.  Straining to have a bowel movement.  Diarrhea.  Pregnancy.  Obesity.  Sitting for long periods of time.  Heavy lifting or other activity that causes you to strain.  Anal sex.  What are the signs or symptoms? Symptoms of this condition include:  Pain.  Anal itching or irritation.  Rectal bleeding.  Leakage of stool (feces).  Anal swelling.  One or more lumps around the anus.  How is this diagnosed? This condition can often be diagnosed through a visual exam. Other exams or tests may also be done, such as:  Examination of the rectal area with a gloved hand (digital rectal exam).  Examination of the anal canal using a small tube (anoscope).  A blood test, if you have lost a significant amount of blood.  A test to look inside the colon (sigmoidoscopy or colonoscopy).  How is this treated? This condition can usually be treated at home. However, various procedures may be done if dietary changes, lifestyle changes, and other home treatments do not help your symptoms. These procedures can help make the hemorrhoids smaller or remove them completely. Some of these procedures involve surgery, and others do not. Common procedures include:  Rubber band ligation. Rubber  bands are placed at the base of the hemorrhoids to cut off the blood supply to them.  Sclerotherapy. Medicine is injected into the hemorrhoids to shrink them.  Infrared coagulation. A type of light energy is used to get rid of the hemorrhoids.  Hemorrhoidectomy surgery. The hemorrhoids are surgically removed, and the veins that supply them are tied off.  Stapled hemorrhoidopexy surgery. A circular stapling device is used to remove the hemorrhoids and use staples to cut off the blood supply to them.  Follow these instructions at home: Eating and drinking  Eat foods that have a lot of fiber in them, such as whole grains, beans, nuts, fruits, and vegetables. Ask your health care provider about taking products that have added fiber (fiber supplements).  Drink enough fluid to keep your urine clear or pale yellow. Managing pain and swelling  Take warm sitz baths for 20 minutes, 3-4 times a day to ease pain and discomfort.  If directed, apply ice to the affected area. Using ice packs between sitz baths may be helpful. ? Put ice in a plastic bag. ? Place a towel between your skin and the bag. ? Leave the ice on for 20 minutes, 2-3 times a day. General instructions  Take over-the-counter and prescription medicines only as told by your health care provider.  Use  medicated creams or suppositories as told.  Exercise regularly.  Go to the bathroom when you have the urge to have a bowel movement. Do not wait.  Avoid straining to have bowel movements.  Keep the anal area dry and clean. Use wet toilet paper or moist towelettes after a bowel movement.  Do not sit on the toilet for long periods of time. This increases blood pooling and pain. Contact a health care provider if:  You have increasing pain and swelling that are not controlled by treatment or medicine.  You have uncontrolled bleeding.  You have difficulty having a bowel movement, or you are unable to have a bowel  movement.  You have pain or inflammation outside the area of the hemorrhoids. This information is not intended to replace advice given to you by your health care provider. Make sure you discuss any questions you have with your health care provider. Document Released: 09/05/2000 Document Revised: 02/06/2016 Document Reviewed: 05/23/2015 Elsevier Interactive Patient Education  2017 [employer].

## 2017-05-09 ENCOUNTER — Encounter: Payer: Self-pay | Admitting: Family Medicine

## 2017-05-12 ENCOUNTER — Other Ambulatory Visit: Payer: Self-pay

## 2017-05-12 DIAGNOSIS — D649 Anemia, unspecified: Secondary | ICD-10-CM

## 2017-05-13 ENCOUNTER — Other Ambulatory Visit: Payer: BC Managed Care – PPO

## 2017-05-13 DIAGNOSIS — E785 Hyperlipidemia, unspecified: Secondary | ICD-10-CM

## 2017-05-14 LAB — HEPATIC FUNCTION PANEL
ALK PHOS: 55 U/L (ref 33–115)
ALT: 9 U/L (ref 6–29)
AST: 10 U/L (ref 10–35)
Albumin: 3.4 g/dL — ABNORMAL LOW (ref 3.6–5.1)
BILIRUBIN DIRECT: 0.1 mg/dL (ref <=0.2)
BILIRUBIN TOTAL: 0.2 mg/dL (ref 0.2–1.2)
Indirect Bilirubin: 0.1 mg/dL — ABNORMAL LOW (ref 0.2–1.2)
Total Protein: 6.1 g/dL (ref 6.1–8.1)

## 2017-05-14 LAB — LIPID PANEL
CHOLESTEROL: 115 mg/dL (ref <200)
HDL: 40 mg/dL — AB (ref >50)
LDL Cholesterol: 59 mg/dL (ref <100)
Total CHOL/HDL Ratio: 2.9 Ratio (ref <5.0)
Triglycerides: 80 mg/dL (ref <150)
VLDL: 16 mg/dL (ref <30)

## 2017-06-28 ENCOUNTER — Other Ambulatory Visit: Payer: Self-pay | Admitting: Family Medicine

## 2017-06-28 DIAGNOSIS — E119 Type 2 diabetes mellitus without complications: Secondary | ICD-10-CM

## 2017-06-28 DIAGNOSIS — E785 Hyperlipidemia, unspecified: Secondary | ICD-10-CM

## 2017-07-21 ENCOUNTER — Other Ambulatory Visit: Payer: Self-pay

## 2017-08-10 ENCOUNTER — Encounter: Payer: BC Managed Care – PPO | Admitting: Family Medicine

## 2017-08-31 ENCOUNTER — Encounter: Payer: BC Managed Care – PPO | Admitting: Family Medicine

## 2017-09-02 ENCOUNTER — Ambulatory Visit: Payer: BC Managed Care – PPO | Admitting: Family Medicine

## 2017-09-02 ENCOUNTER — Encounter: Payer: Self-pay | Admitting: Family Medicine

## 2017-09-02 VITALS — BP 140/88 | HR 76 | Ht 64.5 in | Wt 284.8 lb

## 2017-09-02 DIAGNOSIS — M25511 Pain in right shoulder: Secondary | ICD-10-CM | POA: Diagnosis not present

## 2017-09-02 DIAGNOSIS — Z23 Encounter for immunization: Secondary | ICD-10-CM | POA: Diagnosis not present

## 2017-09-02 NOTE — Patient Instructions (Addendum)
Stop taking advil Continue Aleve--take two tablets twice daily with food. Do this regularly for up to 10 days, or until your pain has completely resolved (less than 10-14 days). Try heat, massage, stretches, and do range of motion exercises. Let us know if you develop muscle spasm and we can add a muscle relaxant.  You can use Biofreeze or Icy hot, along with Tylenol, if additional pain relief (beyond the Aleve alone) is needed.  Return if you develop worsening pain, weakness, numbness, tingling or other new symptoms.    Shoulder Range of Motion Exercises Shoulder range of motion (ROM) exercises are designed to keep the shoulder moving freely. They are often recommended for people who have shoulder pain. Phase 1 exercises When you are able, do this exercise 5-6 days per week, or as told by your health care provider. Work toward doing 2 sets of 10 swings. Pendulum Exercise How To Do This Exercise Lying Down 1. Lie face-down on a bed with your abdomen close to the side of the bed. 2. Let your arm hang over the side of the bed. 3. Relax your shoulder, arm, and hand. 4. Slowly and gently swing your arm forward and back. Do not use your neck muscles to swing your arm. They should be relaxed. If you are struggling to swing your arm, have someone gently swing it for you. When you do this exercise for the first time, swing your arm at a 15 degree angle for 15 seconds, or swing your arm 10 times. As pain lessens over time, increase the angle of the swing to 30-45 degrees. 5. Repeat steps 1-4 with the other arm.  How To Do This Exercise While Standing 1. Stand next to a sturdy chair or table and hold on to it with your hand. 1. Bend forward at the waist. 2. Bend your knees slightly. 3. Relax your other arm and let it hang limp. 4. Relax the shoulder blade of the arm that is hanging and let it drop. 5. While keeping your shoulder relaxed, use body motion to swing your arm in small circles. The  first time you do this exercise, swing your arm for about 30 seconds or 10 times. When you do it next time, swing your arm for a little longer. 6. Stand up tall and relax. 7. Repeat steps 1-7, this time changing the direction of the circles. 2. Repeat steps 1-8 with the other arm.  Phase 2 exercises Do these exercises 3-4 times per day on 5-6 days per week or as told by your health care provider. Work toward holding the stretch for 20 seconds. Stretching Exercise 1 1. Lift your arm straight out in front of you. 2. Bend your arm 90 degrees at the elbow (right angle) so your forearm goes across your body and looks like the letter "L." 3. Use your other arm to gently pull the elbow forward and across your body. 4. Repeat steps 1-3 with the other arm. Stretching Exercise 2 You will need a towel or rope for this exercise. 1. Bend one arm behind your back with the palm facing outward. 2. Hold a towel with your other hand. 3. Reach the arm that holds the towel above your head, and bend that arm at the elbow. Your wrist should be behind your neck. 4. Use your free hand to grab the free end of the towel. 5. With the higher hand, gently pull the towel up behind you. 6. With the lower hand, pull the towel down  behind you. 7. Repeat steps 1-6 with the other arm.  Phase 3 exercises Do each of these exercises at four different times of day (sessions) every day or as told by your health care provider. To begin with, repeat each exercise 5 times (repetitions). Work toward doing 3 sets of 12 repetitions or as told by your health care provider. Strengthening Exercise 1 You will need a light weight for this activity. As you grow stronger, you may use a heavier weight. 1. Standing with a weight in your hand, lift your arm straight out to the side until it is at the same height as your shoulder. 2. Bend your arm at 90 degrees so that your fingers are pointing to the ceiling. 3. Slowly raise your hand until  your arm is straight up in the air. 4. Repeat steps 1-3 with the other arm.  Strengthening Exercise 2 You will need a light weight for this activity. As you grow stronger, you may use a heavier weight. 1. Standing with a weight in your hand, gradually move your straight arm in an arc, starting at your side, then out in front of you, then straight up over your head. 2. Gradually move your other arm in an arc, starting at your side, then out in front of you, then straight up over your head. 3. Repeat steps 1-2 with the other arm.  Strengthening Exercise 3 You will need an elastic band for this activity. As you grow stronger, gradually increase the size of the bands or increase the number of bands that you use at one time. 1. While standing, hold an elastic band in one hand and raise that arm up in the air. 2. With your other hand, pull down the band until that hand is by your side. 3. Repeat steps 1-2 with the other arm.  This information is not intended to replace advice given to you by your health care provider. Make sure you discuss any questions you have with your health care provider. Document Released: 06/07/2003 Document Revised: 05/04/2016 Document Reviewed: 09/04/2014 Elsevier Interactive Patient Education  [employer].

## 2017-09-02 NOTE — Progress Notes (Signed)
Chief Complaint  Patient presents with  . Shoulder Pain    hit shoulder blade area on friends car door realy hard Friday. Hurts from front of shoulder all the way to the shoulder blade. Right side of neck hurts. Entire right side of her body hurts unless she is taking advil/aleve. Hurts to lift right leg-cannot take a deep breath.    08/28/17 she was standing in the parking lot, as she turned around, a car door opened and hit into the right side of her back (upper back, near the shoulder blade).  She had acute onset of pain where it hit, but there was a delay before the rest of her symptoms developed. Later that night her right neck started hurting, anterior chest and down the back. The following day she started taking tylenol. Yesterday pain was worse, took about 10 advil (2-3 at a time), didn't seem to help much.  Last night the pain was unbearable, made her cry.  Took 2 aleve today, which has helped some. Current pain level is 3.5-4/10.  She reports today that the whole right side of her body is hurting (thought feeling much better after taking the Aleve).  Heating pad temporarily helps.  Today is the first day she drove--lifting the right foot up to get in car and drive caused discomfort. Aleve helped a lot..  Hurts to take a deep breath in the upper back Felt better after massage by her mother.   PMH, PSH, SH reviewed. Her med check earlier this week was canceled due to snow. She has rescheduled for January. She is not fasting for labs today.  Current Outpatient Medications on File Prior to Visit  Medication Sig Dispense Refill  . amphetamine-dextroamphetamine (ADDERALL) 20 MG tablet Take 1 tablet (20 mg total) by mouth See admin instructions. Take 1.5-2 tablets in the morning and 1 tablet in the evening, as directed 60 tablet 0  . aspirin 81 MG EC tablet Take 1 tablet (81 mg total) by mouth daily. Swallow whole, take with food    . Cholecalciferol (VITAMIN D) 2000 units tablet Take  2,000 Units by mouth daily.    . Ferrous Sulfate (IRON) 325 (65 FE) MG TABS Take 1 tablet by mouth daily.     Marland Kitchen losartan-hydrochlorothiazide (HYZAAR) 100-25 MG tablet TAKE 1 TABLET BY MOUTH DAILY 90 tablet 1  . metFORMIN (GLUCOPHAGE-XR) 500 MG 24 hr tablet TAKE 2 TABLETS BY MOUTH EVERY DAY WITH BREAKFAST 180 tablet 1  . Multiple Vitamins-Minerals (MULTIVITAMIN WITH MINERALS) tablet Take 1 tablet by mouth daily. Reported on 01/02/2016    . rosuvastatin (CRESTOR) 5 MG tablet TAKE 1 TABLET BY MOUTH EVERY DAY 30 tablet 1  . fluticasone (FLONASE) 50 MCG/ACT nasal spray Place 2 sprays into both nostrils daily. (Patient not taking: Reported on 09/02/2017) 16 g 11   Current Facility-Administered Medications on File Prior to Visit  Medication Dose Route Frequency Provider Last Rate Last Dose  . 0.9 %  sodium chloride infusion  500 mL Intravenous Continuous Armbruster, Carlota Raspberry, MD       Allergies  Allergen Reactions  . Onglyza [Saxagliptin] Other (See Comments)    Eye irritation (when on [place]; tolerates metformin without problems)  Patient denies allergy to this product. States that it was another medication   ROS: no fever, chills, headache, dizziness, chest pain (just some right side chest discomfort), shortness of breath (slight pain with deep breath). No nausea, vomiting, bowel changes, bleeding, bruising, swelling, rash. No numbness, tingling, weakness. Moods  are good.  PHYSICAL EXAM:  BP 140/88   Pulse 76   Ht 5' 4.5" (1.638 m)   Wt 284 lb 12.8 oz (129.2 kg)   SpO2 98%   BMI 48.13 kg/m   Well appearing, pleasant female, in no distress Neck: no c-spine tenderness Back: no spinal tenderness or CVA tenderness R shoulder--Active ROM of abduction was limited due to pain.  Full passive ROM Seems tighter at right rhomboids and trapezius.  Mildly tender over trapezius (but "actually felt good" with palpation), nontender over the rhomboids. No visible bruising, swelling Slightly tender at  right anterior shoulder. Intact rotator cuff strength Normal strength, DTR's, sensation   ASSESSMENT/PLAN:  Acute pain of right shoulder - due to impact/trauma at upper back. Mild muscle tightness, otherwise normal exam. Continue NSAIDs; ROM exercises  Need for influenza vaccination - Plan: Flu Vaccine QUAD 6+ mos PF IM (Fluarix Quad PF)  F/u as scheduled for med check in January.   Stop taking advil Continue Aleve--take two tablets twice daily with food. Do this regularly for up to 10 days, or until your pain has completely resolved (less than 10-14 days). Try heat, massage, stretches, and do range of motion exercises. Let us know if you develop muscle spasm and we can add a muscle relaxant.  You can use Biofreeze or Icy hot, along with Tylenol, if additional pain relief (beyond the Aleve alone) is needed.  Return if you develop worsening pain, weakness, numbness, tingling or other new symptoms.

## 2017-09-22 DIAGNOSIS — R2231 Localized swelling, mass and lump, right upper limb: Secondary | ICD-10-CM

## 2017-09-22 HISTORY — DX: Localized swelling, mass and lump, right upper limb: R22.31

## 2017-10-07 ENCOUNTER — Other Ambulatory Visit: Payer: Self-pay | Admitting: Family Medicine

## 2017-10-07 NOTE — Progress Notes (Signed)
Chief Complaint  Patient presents with  . Hyperlipidemia    fasting med check for chol, blood pressure, diabetes and ADD. Needs refill on adderall.    She is asking about bariatric surgery.  Diabetes--She hasn't been checking her sugars recently. Her machine broke, didn't get around to checking with her insurance. Compliant with her medications, no side effects. She cut out drinking regular soda about 3.5 weeks ago, and she cut out the juice (just rare, not daily). Last eye exam was 08/2016. She had to cancel her appointment a few weeks ago and needs to reschedule. Lab Results  Component Value Date   HGBA1C 6.3 02/02/2017    Hyperlipidemia: She was started on Crestor 54m in May. She had been intolerant of atorvastatin due to myalgias. She is tolerating the low dose Crestor without myalgias. At her last visit, she felt like she was slightly more forgetful (came early for her last appointment, recalls that she walked out of the house with mismatched shoes twice prior to her last visit). She has ADD and had not been taking her ADD medications since May (due to being out of school), which was felt to be a contributing factor.  She is back to taking her Adderall and has no further memory concerns related to the Crestor. Lipids prior to treatment: RecentLabs       Lab Results  Component Value Date   CHOL 133 09/08/2016   HDL 30 (L) 09/08/2016   LDLCALC 79 09/08/2016   TRIG 119 09/08/2016   CHOLHDL 4.4 09/08/2016     Labs after being on Crestor: Lab Results  Component Value Date   CHOL 115 05/13/2017   HDL 40 (L) 05/13/2017   LDLCALC 59 05/13/2017   TRIG 80 05/13/2017   CHOLHDL 2.9 05/13/2017   ADHD:  She has been taking 1 Adderall daily during the week, feels much more focused. Doesn't take it on the weekends.  She is not currently in classes (no plans in the near future).  Does think that it keep her more focused through the week, less forgetful, gets more accomplished, stays  on task better, and prefers to stay on it since noticing the improvement. Requesting refill today. Denies side effects, no tics.  Denies insomnia.  Hypertension-- BP has been running 120-135/78-82, mostly <130.  Improved when getting regular exercise. Not getting as much exercise recently with the cold weather. Denies any headaches, chest pain, palpitations, edema.  Walks 1 mile 3x/week.  Hasn't been going to the gym.  Vitamin D deficiency--Last level was normal (43) in 01/2017. She was taking 2000 IU at that time, after taking another course of prescription when level was low in October 2017 (22). She continues to take 2000 IU daily, though no longer taking MVI.  Allergies: Has been using her Flonase regularly, which has been effective. Currently not needing an OTC antihistamine.  Anemia:  S/p EGD and Colonoscopy in August. Pathology from stomach biopsy showed: ANTRAL MUCOSA WITH MILD CHANGES OF REACTIVE GASTROPATHY; BODY MUCOSA WITH SLIGHT CHRONIC INFLAMMATION.  Colonoscopy--notable for internal hemorrhoids and diverticulosis. Gastroenterologist did not see a GI source for iron deficiency, suspects related to menstrual losses. She reports that her menses have been much lighter and shorter over the last year. She is still taking iron every other day. Due for recheck CBC (he recommended checking "in a few months", future order in system, not yet done).  PMH, PSH, SH reviewed  Outpatient Encounter Medications as of 10/08/2017  Medication Sig Note  .  amphetamine-dextroamphetamine (ADDERALL) 20 MG tablet Take 1 tablet (20 mg total) by mouth See admin instructions. Take 1.5-2 tablets in the morning and 1 tablet in the evening, as directed   . aspirin 81 MG EC tablet Take 1 tablet (81 mg total) by mouth daily. Swallow whole, take with food   . Cholecalciferol (VITAMIN D) 2000 units tablet Take 2,000 Units by mouth daily.   . Ferrous Sulfate (IRON) 325 (65 FE) MG TABS Take 1 tablet by mouth daily.   09/02/2017: Taking every other day  . losartan-hydrochlorothiazide (HYZAAR) 100-25 MG tablet Take 1 tablet by mouth daily.   . metFORMIN (GLUCOPHAGE-XR) 500 MG 24 hr tablet TAKE 2 TABLETS BY MOUTH EVERY DAY WITH BREAKFAST   . Multiple Vitamins-Minerals (MULTIVITAMIN WITH MINERALS) tablet Take 1 tablet by mouth daily. Reported on 01/02/2016   . rosuvastatin (CRESTOR) 5 MG tablet Take 1 tablet (5 mg total) by mouth daily.   . [DISCONTINUED] amphetamine-dextroamphetamine (ADDERALL) 20 MG tablet Take 1 tablet (20 mg total) by mouth See admin instructions. Take 1.5-2 tablets in the morning and 1 tablet in the evening, as directed   . [DISCONTINUED] losartan-hydrochlorothiazide (HYZAAR) 100-25 MG tablet TAKE 1 TABLET BY MOUTH DAILY   . [DISCONTINUED] metFORMIN (GLUCOPHAGE-XR) 500 MG 24 hr tablet TAKE 2 TABLETS BY MOUTH EVERY DAY WITH BREAKFAST   . [DISCONTINUED] rosuvastatin (CRESTOR) 5 MG tablet TAKE 1 TABLET BY MOUTH EVERY DAY   . fluticasone (FLONASE) 50 MCG/ACT nasal spray Place 2 sprays into both nostrils daily. (Patient not taking: Reported on 09/02/2017)    Facility-Administered Encounter Medications as of 10/08/2017  Medication  . 0.9 %  sodium chloride infusion   Allergies  Allergen Reactions  . Onglyza [Saxagliptin] Other (See Comments)    Eye irritation (when on [place]; tolerates metformin without problems)  Patient denies allergy to this product. States that it was another medication    ROS: no fever, chills, headaches, dizziness, syncope, chest pain, shortness of breath, URI symptoms, cough, GI or GU complaints, joint pains, depression, bleeding, bruising, rash, depression or other complaints. Shoulder pain completely resolved (seen last month with injury)  PHYSICAL EXAM:  BP 130/76   Pulse 80   Ht 5' 4.5" (1.638 m)   Wt 274 lb (124.3 kg)   LMP 09/25/2017 (Exact Date)   BMI 46.31 kg/m   Wt Readings from Last 3 Encounters:  10/08/17 274 lb (124.3 kg)  09/02/17 284 lb 12.8  oz (129.2 kg)  05/08/17 276 lb 6.4 oz (125.4 kg)   Well appearing, pleasant obese female in no distress HEENT: conjunctiva and sclera are clear, EOMI, OP clear Neck: no lymphadenopathy, thyromegaly or mass Heart: regular rate and rhythm, no murmur Lungs: clear bilaterally Abdomen: soft, nontender, no organomegaly or mass Back: no spinal or CVA tenderness Extremities: no edema, normal pulses Skin: normal turgor, no rashes Psych: normal mood, affect, hygiene and grooming.  Normal attention, no hyperactivity.   Lab Results  Component Value Date   HGBA1C 6.7 10/08/2017    ASSESSMENT/PLAN:  Controlled type 2 diabetes mellitus without complication, without long-term current use of insulin (HCC) - A1c slightly higher; diet has recently improved, with weight loss. Cont current meds; counseled re: diet/wt loss/exercise - Plan: HgB A1c, Comprehensive metabolic panel, rosuvastatin (CRESTOR) 5 MG tablet, metFORMIN (GLUCOPHAGE-XR) 500 MG 24 hr tablet  Dyslipidemia - tolerating Crestor without side effects. - Plan: Lipid panel, rosuvastatin (CRESTOR) 5 MG tablet  Iron deficiency anemia, unspecified iron deficiency anemia type - due for recheck; will forward  results to her GI - Plan: CBC with Differential/Platelet  Attention deficit disorder, unspecified hyperactivity presence - doing well with taking medication once daily M-F (will need more frequently if/when back in classes) - Plan: amphetamine-dextroamphetamine (ADDERALL) 20 MG tablet  Essential hypertension, benign - Plan: Comprehensive metabolic panel  Vitamin D deficiency - Plan: VITAMIN D 25 Hydroxy (Vit-D Deficiency, Fractures)  Attention deficit disorder, unspecified hyperactivity presence - refill for when she needs it (classes); possibly not taking meds is contributing to her concentration/memory issues - Plan: amphetamine-dextroamphetamine (ADDERALL) 20 MG tablet  Morbid obesity with body mass index of 40.0-49.9 (HCC) - discussed  diet, exercise, accountability, medications and bariatric options. Rec starting with [employer]; consider meds    CBC, c-met, lipid,D (since no longer taking MVI) A1c  Forward CBC to Dr. Havery Moros  F/u 6 months CPE, sooner prn.    Please check to see which test strips/monitors are covered by your insurance. If we don't have one of those monitors, please buy your own, and we will call in the test strips  Please consider joining [employer]. Consider weight loss medications such as Belviq, Contrave, or changing your diabetes medication to include one that also helps with weight loss. This could include weekly injections such as Trulicity or Bydureon B-cise. Consider surgical options if not successful with other measures.  Try and get at least 150 minutes/week of aerobic exercise, even if in just 10-15 minute intervals (while cooking, watching TV).

## 2017-10-08 ENCOUNTER — Encounter: Payer: Self-pay | Admitting: Family Medicine

## 2017-10-08 ENCOUNTER — Ambulatory Visit: Payer: BC Managed Care – PPO | Admitting: Family Medicine

## 2017-10-08 VITALS — BP 130/76 | HR 80 | Ht 64.5 in | Wt 274.0 lb

## 2017-10-08 DIAGNOSIS — E119 Type 2 diabetes mellitus without complications: Secondary | ICD-10-CM | POA: Diagnosis not present

## 2017-10-08 DIAGNOSIS — E559 Vitamin D deficiency, unspecified: Secondary | ICD-10-CM | POA: Diagnosis not present

## 2017-10-08 DIAGNOSIS — E785 Hyperlipidemia, unspecified: Secondary | ICD-10-CM | POA: Diagnosis not present

## 2017-10-08 DIAGNOSIS — D509 Iron deficiency anemia, unspecified: Secondary | ICD-10-CM

## 2017-10-08 DIAGNOSIS — F988 Other specified behavioral and emotional disorders with onset usually occurring in childhood and adolescence: Secondary | ICD-10-CM

## 2017-10-08 DIAGNOSIS — I10 Essential (primary) hypertension: Secondary | ICD-10-CM | POA: Diagnosis not present

## 2017-10-08 LAB — POCT GLYCOSYLATED HEMOGLOBIN (HGB A1C): HEMOGLOBIN A1C: 6.7

## 2017-10-08 MED ORDER — AMPHETAMINE-DEXTROAMPHETAMINE 20 MG PO TABS: 20.0000 mg | ORAL_TABLET | ORAL | 0 refills | Status: DC

## 2017-10-08 MED ORDER — LOSARTAN POTASSIUM-HCTZ 100-25 MG PO TABS: 1.0000 | ORAL_TABLET | Freq: Every day | ORAL | 1 refills | Status: DC

## 2017-10-08 MED ORDER — METFORMIN HCL ER 500 MG PO TB24: ORAL_TABLET | ORAL | 1 refills | Status: DC

## 2017-10-08 MED ORDER — ROSUVASTATIN CALCIUM 5 MG PO TABS: 5.0000 mg | ORAL_TABLET | Freq: Every day | ORAL | 1 refills | Status: DC

## 2017-10-08 NOTE — Patient Instructions (Signed)
  Please check to see which test strips/monitors are covered by your insurance. If we don't have one of those monitors, please buy your own, and we will call in the test strips  Please consider joining [employer]. Consider weight loss medications such as Belviq, Contrave, or changing your diabetes medication to include one that also helps with weight loss. This could include weekly injections such as Trulicity or Bydureon B-cise. Consider surgical options if not successful with other measures.  Try and get at least 150 minutes/week of aerobic exercise, even if in just 10-15 minute intervals (while cooking, watching TV).

## 2017-10-09 LAB — COMPREHENSIVE METABOLIC PANEL
ALK PHOS: 62 IU/L (ref 39–117)
ALT: 17 IU/L (ref 0–32)
AST: 17 IU/L (ref 0–40)
Albumin/Globulin Ratio: 1.4 (ref 1.2–2.2)
Albumin: 4.3 g/dL (ref 3.5–5.5)
BUN/Creatinine Ratio: 17 (ref 9–23)
BUN: 14 mg/dL (ref 6–24)
Bilirubin Total: 0.4 mg/dL (ref 0.0–1.2)
CALCIUM: 9.9 mg/dL (ref 8.7–10.2)
CO2: 23 mmol/L (ref 20–29)
CREATININE: 0.83 mg/dL (ref 0.57–1.00)
Chloride: 100 mmol/L (ref 96–106)
GFR calc Af Amer: 96 mL/min/{1.73_m2} (ref >59)
GFR, EST NON AFRICAN AMERICAN: 83 mL/min/{1.73_m2} (ref >59)
GLOBULIN, TOTAL: 3.1 g/dL (ref 1.5–4.5)
Glucose: 101 mg/dL — ABNORMAL HIGH (ref 65–99)
Potassium: 4.1 mmol/L (ref 3.5–5.2)
SODIUM: 140 mmol/L (ref 134–144)
Total Protein: 7.4 g/dL (ref 6.0–8.5)

## 2017-10-09 LAB — CBC WITH DIFFERENTIAL/PLATELET
BASOS ABS: 0 10*3/uL (ref 0.0–0.2)
Basos: 0 %
EOS (ABSOLUTE): 0.2 10*3/uL (ref 0.0–0.4)
EOS: 3 %
HEMATOCRIT: 37.7 % (ref 34.0–46.6)
Hemoglobin: 12.4 g/dL (ref 11.1–15.9)
Immature Grans (Abs): 0 10*3/uL (ref 0.0–0.1)
Immature Granulocytes: 0 %
LYMPHS ABS: 1.8 10*3/uL (ref 0.7–3.1)
Lymphs: 25 %
MCH: 26.3 pg — AB (ref 26.6–33.0)
MCHC: 32.9 g/dL (ref 31.5–35.7)
MCV: 80 fL (ref 79–97)
MONOCYTES: 6 %
MONOS ABS: 0.4 10*3/uL (ref 0.1–0.9)
NEUTROS ABS: 4.8 10*3/uL (ref 1.4–7.0)
Neutrophils: 66 %
Platelets: 317 10*3/uL (ref 150–379)
RBC: 4.71 x10E6/uL (ref 3.77–5.28)
RDW: 15.9 % — AB (ref 12.3–15.4)
WBC: 7.3 10*3/uL (ref 3.4–10.8)

## 2017-10-09 LAB — LIPID PANEL
CHOL/HDL RATIO: 2.8 ratio (ref 0.0–4.4)
CHOLESTEROL TOTAL: 102 mg/dL (ref 100–199)
HDL: 37 mg/dL — ABNORMAL LOW (ref >39)
LDL CALC: 49 mg/dL (ref 0–99)
TRIGLYCERIDES: 81 mg/dL (ref 0–149)
VLDL CHOLESTEROL CAL: 16 mg/dL (ref 5–40)

## 2017-10-09 LAB — VITAMIN D 25 HYDROXY (VIT D DEFICIENCY, FRACTURES): VIT D 25 HYDROXY: 52.4 ng/mL (ref 30.0–100.0)

## 2017-12-01 ENCOUNTER — Other Ambulatory Visit: Payer: Self-pay | Admitting: Family Medicine

## 2017-12-01 DIAGNOSIS — Z1231 Encounter for screening mammogram for malignant neoplasm of breast: Secondary | ICD-10-CM

## 2017-12-18 LAB — HM DIABETES EYE EXAM

## 2017-12-21 ENCOUNTER — Ambulatory Visit
Admission: RE | Admit: 2017-12-21 | Discharge: 2017-12-21 | Disposition: A | Discharge status: Home / Self Care | Payer: BC Managed Care – PPO | Source: Ambulatory Visit | Attending: Family Medicine | Admitting: Family Medicine

## 2017-12-21 DIAGNOSIS — Z1231 Encounter for screening mammogram for malignant neoplasm of breast: Secondary | ICD-10-CM

## 2017-12-24 ENCOUNTER — Encounter: Payer: Self-pay | Admitting: *Deleted

## 2018-01-14 ENCOUNTER — Other Ambulatory Visit: Payer: Self-pay

## 2018-01-14 ENCOUNTER — Ambulatory Visit: Payer: BC Managed Care – PPO | Admitting: Obstetrics and Gynecology

## 2018-01-14 ENCOUNTER — Other Ambulatory Visit (HOSPITAL_COMMUNITY)
Admission: RE | Admit: 2018-01-14 | Discharge: 2018-01-14 | Disposition: A | Discharge status: Home / Self Care | Payer: BC Managed Care – PPO | Source: Ambulatory Visit | Attending: Obstetrics and Gynecology | Admitting: Obstetrics and Gynecology

## 2018-01-14 ENCOUNTER — Encounter: Payer: Self-pay | Admitting: Obstetrics and Gynecology

## 2018-01-14 VITALS — BP 132/70 | HR 84 | Resp 16 | Ht 64.25 in | Wt 283.0 lb

## 2018-01-14 DIAGNOSIS — Z113 Encounter for screening for infections with a predominantly sexual mode of transmission: Secondary | ICD-10-CM | POA: Diagnosis present

## 2018-01-14 DIAGNOSIS — N920 Excessive and frequent menstruation with regular cycle: Secondary | ICD-10-CM

## 2018-01-14 DIAGNOSIS — Z01419 Encounter for gynecological examination (general) (routine) without abnormal findings: Secondary | ICD-10-CM | POA: Insufficient documentation

## 2018-01-14 DIAGNOSIS — R2231 Localized swelling, mass and lump, right upper limb: Secondary | ICD-10-CM | POA: Diagnosis not present

## 2018-01-14 DIAGNOSIS — N841 Polyp of cervix uteri: Secondary | ICD-10-CM | POA: Diagnosis not present

## 2018-01-14 NOTE — Patient Instructions (Signed)

## 2018-01-14 NOTE — Progress Notes (Signed)
Scheduled patient while in the office for right breast diagnostic mammogram and ultrasound at the [facility] on 01/22/2018 at 9:50 am. Patient is agreeable to date and time. Placed in mammogram hold.
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
## 2018-01-15 LAB — HEPATITIS C ANTIBODY: Hep C Virus Ab: <0.1 s/co ratio (ref 0.0–0.9)

## 2018-01-15 LAB — HEP, RPR, HIV PANEL
HIV SCREEN 4TH GENERATION: NONREACTIVE
Hepatitis B Surface Ag: NEGATIVE
RPR: NONREACTIVE

## 2018-01-18 ENCOUNTER — Telehealth: Payer: Self-pay | Admitting: Obstetrics and Gynecology

## 2018-01-18 NOTE — Telephone Encounter (Signed)
Call placed to patient to review benefits for a recommended ultrasound. Left voicemail message requesting a return call. °

## 2018-01-19 LAB — CYTOLOGY - PAP
CHLAMYDIA, DNA PROBE: NEGATIVE
DIAGNOSIS: NEGATIVE
HPV: NOT DETECTED
Neisseria Gonorrhea: NEGATIVE
TRICH (WINDOWPATH): NEGATIVE

## 2018-01-19 NOTE — Telephone Encounter (Signed)
Returned call to patient.  Spoke with patient regarding benefit for recommended ultrasound.  Patient understood and agreeable. Patient ready to schedule. Patient scheduled 01/28/18 with Dr Quincy Simmonds. Patient aware of appointment date, arrival time and cancellation policy. No further questions.   Will close encounter.  Forwarding to Dr Quincy Simmonds for final review

## 2018-01-19 NOTE — Telephone Encounter (Signed)
Patient returned the call to [facility]. She'd like a call back on her mobile at: [phone].

## 2018-01-22 ENCOUNTER — Ambulatory Visit
Admission: RE | Admit: 2018-01-22 | Discharge: 2018-01-22 | Disposition: A | Discharge status: Home / Self Care | Payer: BC Managed Care – PPO | Source: Ambulatory Visit | Attending: Obstetrics and Gynecology | Admitting: Obstetrics and Gynecology

## 2018-01-22 DIAGNOSIS — R2231 Localized swelling, mass and lump, right upper limb: Secondary | ICD-10-CM

## 2018-01-22 HISTORY — DX: Localized swelling, mass and lump, right upper limb: R22.31

## 2018-01-28 ENCOUNTER — Ambulatory Visit: Payer: BC Managed Care – PPO | Admitting: Obstetrics and Gynecology

## 2018-01-28 ENCOUNTER — Encounter: Payer: Self-pay | Admitting: Obstetrics and Gynecology

## 2018-01-28 ENCOUNTER — Other Ambulatory Visit: Payer: Self-pay

## 2018-01-28 ENCOUNTER — Ambulatory Visit (INDEPENDENT_AMBULATORY_CARE_PROVIDER_SITE_OTHER): Payer: BC Managed Care – PPO

## 2018-01-28 VITALS — BP 126/84 | HR 80 | Resp 16 | Ht 64.25 in | Wt 283.0 lb

## 2018-01-28 DIAGNOSIS — N920 Excessive and frequent menstruation with regular cycle: Secondary | ICD-10-CM | POA: Diagnosis not present

## 2018-01-28 DIAGNOSIS — D219 Benign neoplasm of connective and other soft tissue, unspecified: Secondary | ICD-10-CM | POA: Diagnosis not present

## 2018-01-28 DIAGNOSIS — N83202 Unspecified ovarian cyst, left side: Secondary | ICD-10-CM
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
## 2018-01-28 NOTE — Patient Instructions (Signed)
Ovarian Cyst An ovarian cyst is a fluid-filled sac that forms on an ovary. The ovaries are small organs that produce eggs in women. Various types of cysts can form on the ovaries. Some may cause symptoms and require treatment. Most ovarian cysts go away on their own, are not cancerous (are benign), and do not cause problems. Common types of ovarian cysts include:  Functional (follicle) cysts. ? Occur during the menstrual cycle, and usually go away with the next menstrual cycle if you do not get pregnant. ? Usually cause no symptoms.  Endometriomas. ? Are cysts that form from the tissue that lines the uterus (endometrium). ? Are sometimes called "chocolate cysts" because they become filled with blood that turns brown. ? Can cause pain in the lower abdomen during intercourse and during your period.  Cystadenoma cysts. ? Develop from cells on the outside surface of the ovary. ? Can get very large and cause lower abdomen pain and pain with intercourse. ? Can cause severe pain if they twist or break open (rupture).  Dermoid cysts. ? Are sometimes found in both ovaries. ? May contain different kinds of body tissue, such as skin, teeth, hair, or cartilage. ? Usually do not cause symptoms unless they get very big.  Theca lutein cysts. ? Occur when too much of a certain hormone (human chorionic gonadotropin) is produced and overstimulates the ovaries to produce an egg. ? Are most common after having procedures used to assist with the conception of a baby (in vitro fertilization).  What are the causes? Ovarian cysts may be caused by:  Ovarian hyperstimulation syndrome. This is a condition that can develop from taking fertility medicines. It causes multiple large ovarian cysts to form.  Polycystic ovarian syndrome (PCOS). This is a common hormonal disorder that can cause ovarian cysts, as well as problems with your period or fertility.  What increases the risk? The following factors may make  you more likely to develop ovarian cysts:  Being overweight or obese.  Taking fertility medicines.  Taking certain forms of hormonal birth control.  Smoking.  What are the signs or symptoms? Many ovarian cysts do not cause symptoms. If symptoms are present, they may include:  Pelvic pain or pressure.  Pain in the lower abdomen.  Pain during sex.  Abdominal swelling.  Abnormal menstrual periods.  Increasing pain with menstrual periods.  How is this diagnosed? These cysts are commonly found during a routine pelvic exam. You may have tests to find out more about the cyst, such as:  Ultrasound.  X-ray of the pelvis.  CT scan.  MRI.  Blood tests.  How is this treated? Many ovarian cysts go away on their own without treatment. Your health care provider may want to check your cyst regularly for 2-3 months to see if it changes. If you are in menopause, it is especially important to have your cyst monitored closely because menopausal women have a higher rate of ovarian cancer. When treatment is needed, it may include:  Medicines to help relieve pain.  A procedure to drain the cyst (aspiration).  Surgery to remove the whole cyst.  Hormone treatment or birth control pills. These methods are sometimes used to help dissolve a cyst.  Follow these instructions at home:  Take over-the-counter and prescription medicines only as told by your health care provider.  Do not drive or use heavy machinery while taking prescription pain medicine.  Get regular pelvic exams and Pap tests as often as told by your health care   provider.  Return to your normal activities as told by your health care provider. Ask your health care provider what activities are safe for you.  Do not use any products that contain nicotine or tobacco, such as cigarettes and e-cigarettes. If you need help quitting, ask your health care provider.  Keep all follow-up visits as told by your health care provider.  This is important. Contact a health care provider if:  Your periods are late, irregular, or painful, or they stop.  You have pelvic pain that does not go away.  You have pressure on your bladder or trouble emptying your bladder completely.  You have pain during sex.  You have any of the following in your abdomen: ? A feeling of fullness. ? Pressure. ? Discomfort. ? Pain that does not go away. ? Swelling.  You feel generally ill.  You become constipated.  You lose your appetite.  You develop severe acne.  You start to have more body hair and facial hair.  You are gaining weight or losing weight without changing your exercise and eating habits.  You think you may be pregnant. Get help right away if:  You have abdominal pain that is severe or gets worse.  You cannot eat or drink without vomiting.  You suddenly develop a fever.  Your menstrual period is much heavier than usual. This information is not intended to replace advice given to you by your health care provider. Make sure you discuss any questions you have with your health care provider. Document Released: 09/08/2005 Document Revised: 03/28/2016 Document Reviewed: 02/10/2016 Elsevier Interactive Patient Education  2018 Else[employer] Inc. Uterine Fibroids Uterine fibroids are tissue masses (tumors) that can develop in the womb (uterus). They are also called leiomyomas. This type of tumor is not cancerous (benign) and does not spread to other parts of the body outside of the pelvic area, which is between the hip bones. Occasionally, fibroids may develop in the fallopian tubes, in the cervix, or on the support structures (ligaments) that surround the uterus. You can have one or many fibroids. Fibroids can vary in size, weight, and where they grow in the uterus. Some can become quite large. Most fibroids do not require medical treatment. What are the causes? A fibroid can develop when a single uterine cell keeps growing  (replicating). Most cells in the human body have a control mechanism that keeps them from replicating without control. What are the signs or symptoms? Symptoms may include:  Heavy bleeding during your period.  Bleeding or spotting between periods.  Pelvic pain and pressure.  Bladder problems, such as needing to urinate more often (urinary frequency) or urgently.  Inability to reproduce offspring (infertility).  Miscarriages.  How is this diagnosed? Uterine fibroids are diagnosed through a physical exam. Your health care provider may feel the lumpy tumors during a pelvic exam. Ultrasonography and an MRI may be done to determine the size, location, and number of fibroids. How is this treated? Treatment may include:  Watchful waiting. This involves getting the fibroid checked by your health care provider to see if it grows or shrinks. Follow your health care provider's recommendations for how often to have this checked.  Hormone medicines. These can be taken by mouth or given through an intrauterine device (IUD).  Surgery. ? Removing the fibroids (myomectomy) or the uterus (hysterectomy). ? Removing blood supply to the fibroids (uterine artery embolization).  If fibroids interfere with your fertility and you want to become pregnant, your health care provider may   recommend having the fibroids removed. Follow these instructions at home:  Keep all follow-up visits as directed by your health care provider. This is important.  Take over-the-counter and prescription medicines only as told by your health care provider. ? If you were prescribed a hormone treatment, take the hormone medicines exactly as directed.  Ask your health care provider about taking iron pills and increasing the amount of dark green, leafy vegetables in your diet. These actions can help to boost your blood iron levels, which may be affected by heavy menstrual bleeding.  Pay close attention to your period and tell  your health care provider about any changes, such as: ? Increased blood flow that requires you to use more pads or tampons than usual per month. ? A change in the number of days that your period lasts per month. ? A change in symptoms that are associated with your period, such as abdominal cramping or back pain. Contact a health care provider if:  You have pelvic pain, back pain, or abdominal cramps that cannot be controlled with medicines.  You have an increase in bleeding between and during periods.  You soak tampons or pads in a half hour or less.  You feel lightheaded, extra tired, or weak. Get help right away if:  You faint.  You have a sudden increase in pelvic pain. This information is not intended to replace advice given to you by your health care provider. Make sure you discuss any questions you have with your health care provider. Document Released: 09/05/2000 Document Revised: 05/08/2016 Document Reviewed: 03/07/2014 Elsevier Interactive Patient Education  2018 Elsevier Inc.  

## 2018-02-08 ENCOUNTER — Telehealth: Payer: Self-pay | Admitting: Obstetrics and Gynecology

## 2018-02-08 NOTE — Telephone Encounter (Signed)
Call placed to patient to review benefits and to schedule six week follow up ultrasound. Left voicemail message requesting a return call

## 2018-02-11 NOTE — Telephone Encounter (Signed)
Patient is returning a call to [facility] to schedule her 6 week follow up ultrasound. Deloris Ping is out of the office until next week. To triage to schedule and Rosa to convey benefits.

## 2018-02-11 NOTE — Telephone Encounter (Signed)
Left message to call Grant Swager at [phone].  

## 2018-02-11 NOTE — Telephone Encounter (Signed)
Spoke with patient. PUS scheduled for 03/11/2018 at 10 am with 10:30 am consult with Dr.Silva. Patient is agreeable to date and time.  Routing to [employer]  to contact patient regarding benefits.

## 2018-03-11 ENCOUNTER — Encounter: Payer: Self-pay | Admitting: Obstetrics and Gynecology

## 2018-03-11 ENCOUNTER — Ambulatory Visit: Payer: BC Managed Care – PPO | Admitting: Obstetrics and Gynecology

## 2018-03-11 ENCOUNTER — Other Ambulatory Visit: Payer: Self-pay

## 2018-03-11 ENCOUNTER — Ambulatory Visit (INDEPENDENT_AMBULATORY_CARE_PROVIDER_SITE_OTHER): Payer: BC Managed Care – PPO

## 2018-03-11 VITALS — BP 118/70 | HR 80 | Resp 16 | Ht 64.25 in | Wt 281.0 lb

## 2018-03-11 DIAGNOSIS — D219 Benign neoplasm of connective and other soft tissue, unspecified: Secondary | ICD-10-CM | POA: Diagnosis not present

## 2018-03-11 DIAGNOSIS — N83202 Unspecified ovarian cyst, left side: Secondary | ICD-10-CM | POA: Diagnosis not present
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
## 2018-03-15 ENCOUNTER — Encounter: Payer: Self-pay | Admitting: Family Medicine

## 2018-03-15 ENCOUNTER — Ambulatory Visit: Payer: BC Managed Care – PPO | Admitting: Family Medicine

## 2018-03-15 VITALS — BP 120/86 | HR 72 | Ht 65.0 in | Wt 283.8 lb

## 2018-03-15 DIAGNOSIS — I10 Essential (primary) hypertension: Secondary | ICD-10-CM | POA: Diagnosis not present

## 2018-03-15 DIAGNOSIS — E559 Vitamin D deficiency, unspecified: Secondary | ICD-10-CM

## 2018-03-15 DIAGNOSIS — F988 Other specified behavioral and emotional disorders with onset usually occurring in childhood and adolescence: Secondary | ICD-10-CM | POA: Diagnosis not present

## 2018-03-15 DIAGNOSIS — D509 Iron deficiency anemia, unspecified: Secondary | ICD-10-CM | POA: Diagnosis not present

## 2018-03-15 DIAGNOSIS — E119 Type 2 diabetes mellitus without complications: Secondary | ICD-10-CM | POA: Diagnosis not present

## 2018-03-15 DIAGNOSIS — J309 Allergic rhinitis, unspecified: Secondary | ICD-10-CM | POA: Diagnosis not present

## 2018-03-15 DIAGNOSIS — Z0000 Encounter for general adult medical examination without abnormal findings: Secondary | ICD-10-CM | POA: Diagnosis not present

## 2018-03-15 DIAGNOSIS — E785 Hyperlipidemia, unspecified: Secondary | ICD-10-CM

## 2018-03-15 LAB — POCT URINALYSIS DIP (PROADVANTAGE DEVICE)
BILIRUBIN UA: NEGATIVE mg/dL
Bilirubin, UA: NEGATIVE
GLUCOSE UA: NEGATIVE mg/dL
Leukocytes, UA: NEGATIVE
Nitrite, UA: NEGATIVE
Protein Ur, POC: NEGATIVE mg/dL
RBC UA: NEGATIVE
SPECIFIC GRAVITY, URINE: 1.025
UUROB: NEGATIVE
pH, UA: 5.5 (ref 5.0–8.0)

## 2018-03-15 LAB — POCT GLYCOSYLATED HEMOGLOBIN (HGB A1C): HEMOGLOBIN A1C: 7.1 % — AB (ref 4.0–5.6)

## 2018-03-15 MED ORDER — METFORMIN HCL ER 500 MG PO TB24: ORAL_TABLET | ORAL | 1 refills | Status: DC

## 2018-03-15 MED ORDER — FLUTICASONE PROPIONATE 50 MCG/ACT NA SUSP: 2.0000 | Freq: Every day | NASAL | 11 refills | Status: DC

## 2018-03-15 MED ORDER — AMPHETAMINE-DEXTROAMPHETAMINE 20 MG PO TABS: 20.0000 mg | ORAL_TABLET | ORAL | 0 refills | Status: DC

## 2018-03-15 MED ORDER — LOSARTAN POTASSIUM-HCTZ 100-25 MG PO TABS: 1.0000 | ORAL_TABLET | Freq: Every day | ORAL | 1 refills | Status: DC

## 2018-03-15 MED ORDER — ROSUVASTATIN CALCIUM 5 MG PO TABS: 5.0000 mg | ORAL_TABLET | Freq: Every day | ORAL | 1 refills | Status: DC

## 2018-03-15 NOTE — Patient Instructions (Addendum)
  HEALTH MAINTENANCE RECOMMENDATIONS:  It is recommended that you get at least 30 minutes of aerobic exercise at least 5 days/week (for weight loss, you may need as much as 60-90 minutes). This can be any activity that gets your heart rate up. This can be divided in 10-15 minute intervals if needed, but try and build up your endurance at least once a week.  Weight bearing exercise is also recommended twice weekly.  Eat a healthy diet with lots of vegetables, fruits and fiber.  "Colorful" foods have a lot of vitamins (ie green vegetables, tomatoes, red peppers, etc).  Limit sweet tea, regular sodas and alcoholic beverages, all of which has a lot of calories and sugar.  Up to 1 alcoholic drink daily may be beneficial for women (unless trying to lose weight, watch sugars).  Drink a lot of water.  Calcium recommendations are 1200-1500 mg daily (1500 mg for postmenopausal women or women without ovaries), and vitamin D 1000 IU daily.  This should be obtained from diet and/or supplements (vitamins), and calcium should not be taken all at once, but in divided doses.  Monthly self breast exams and yearly mammograms for women over the age of 47 is recommended.  Sunscreen of at least SPF 30 should be used on all sun-exposed parts of the skin when outside between the hours of 10 am and 4 pm (not just when at beach or pool, but even with exercise, golf, tennis, and yard work!)  Use a sunscreen that says "broad spectrum" so it covers both UVA and UVB rays, and make sure to reapply every 1-2 hours.  Remember to change the batteries in your smoke detectors when changing your clock times in the spring and fall. I recommend having a carbon monoxide detector in the home.  Use your seat belt every time you are in a car, and please drive safely and not be distracted with cell phones and texting while driving.  I recommend getting the new shingles vaccine (Shingrix). You will need to check with your insurance to see if  it is covered, and if covered by Medicare Part D, you need to get from the pharmacy rather than our office.  It is a series of 2 injections, spaced 2 months apart.  Your sugars have been up--please start checking your sugars on a regular basis so that you are aware of your sugars, and hopefully can see them coming down. Try and cut back on the regular soda (prior to family moving out in August). Regular exercise, limiting sugar in your diet (including soda, other sweets and carbs), and losing weight should all help keep your sugars down and get the A1c down again. We can consider and discuss other diabetes medications that can also help with weight loss.  These are the names in case you are interested in looking into this-- Bydureon B-cise, Trulicity (Victoza is a daily shot, the others are once weekly). Watch the portions of your fruits (it is better than drinking juice, but try and limit portions).

## 2018-03-16 LAB — CBC WITH DIFFERENTIAL/PLATELET
BASOS ABS: 0 10*3/uL (ref 0.0–0.2)
Basos: 0 %
EOS (ABSOLUTE): 0.2 10*3/uL (ref 0.0–0.4)
EOS: 3 %
HEMATOCRIT: 38.6 % (ref 34.0–46.6)
HEMOGLOBIN: 12.6 g/dL (ref 11.1–15.9)
IMMATURE GRANS (ABS): 0 10*3/uL (ref 0.0–0.1)
IMMATURE GRANULOCYTES: 0 %
LYMPHS: 28 %
Lymphocytes Absolute: 2.1 10*3/uL (ref 0.7–3.1)
MCH: 27.4 pg (ref 26.6–33.0)
MCHC: 32.6 g/dL (ref 31.5–35.7)
MCV: 84 fL (ref 79–97)
MONOCYTES: 5 %
Monocytes Absolute: 0.4 10*3/uL (ref 0.1–0.9)
NEUTROS PCT: 64 %
Neutrophils Absolute: 4.9 10*3/uL (ref 1.4–7.0)
Platelets: 310 10*3/uL (ref 150–450)
RBC: 4.6 x10E6/uL (ref 3.77–5.28)
RDW: 15.3 % (ref 12.3–15.4)
WBC: 7.7 10*3/uL (ref 3.4–10.8)

## 2018-03-16 LAB — COMPREHENSIVE METABOLIC PANEL
ALBUMIN: 4.1 g/dL (ref 3.5–5.5)
ALT: 10 IU/L (ref 0–32)
AST: 12 IU/L (ref 0–40)
Albumin/Globulin Ratio: 1.4 (ref 1.2–2.2)
Alkaline Phosphatase: 61 IU/L (ref 39–117)
BUN/Creatinine Ratio: 19 (ref 9–23)
BUN: 15 mg/dL (ref 6–24)
Bilirubin Total: 0.3 mg/dL (ref 0.0–1.2)
CALCIUM: 9.5 mg/dL (ref 8.7–10.2)
CO2: 23 mmol/L (ref 20–29)
Chloride: 104 mmol/L (ref 96–106)
Creatinine, Ser: 0.8 mg/dL (ref 0.57–1.00)
GFR, EST AFRICAN AMERICAN: 99 mL/min/{1.73_m2} (ref >59)
GFR, EST NON AFRICAN AMERICAN: 86 mL/min/{1.73_m2} (ref >59)
GLOBULIN, TOTAL: 2.9 g/dL (ref 1.5–4.5)
GLUCOSE: 120 mg/dL — AB (ref 65–99)
POTASSIUM: 3.9 mmol/L (ref 3.5–5.2)
SODIUM: 141 mmol/L (ref 134–144)
TOTAL PROTEIN: 7 g/dL (ref 6.0–8.5)

## 2018-03-16 LAB — LIPID PANEL
CHOLESTEROL TOTAL: 106 mg/dL (ref 100–199)
Chol/HDL Ratio: 3.2 ratio (ref 0.0–4.4)
HDL: 33 mg/dL — ABNORMAL LOW (ref >39)
LDL Calculated: 53 mg/dL (ref 0–99)
Triglycerides: 100 mg/dL (ref 0–149)
VLDL CHOLESTEROL CAL: 20 mg/dL (ref 5–40)

## 2018-03-16 LAB — MICROALBUMIN / CREATININE URINE RATIO
CREATININE, UR: 95.3 mg/dL
Microalb/Creat Ratio: <3.1 mg/g creat (ref 0.0–30.0)

## 2018-03-16 LAB — TSH: TSH: 1.95 u[IU]/mL (ref 0.450–4.500)

## 2018-03-16 LAB — FERRITIN: FERRITIN: 37 ng/mL (ref 15–150)

## 2018-03-23 ENCOUNTER — Telehealth: Payer: Self-pay

## 2018-03-23 ENCOUNTER — Other Ambulatory Visit: Payer: Self-pay

## 2018-03-23 MED ORDER — ONETOUCH DELICA LANCETS 33G MISC: 2 refills | Status: DC

## 2018-03-23 MED ORDER — GLUCOSE BLOOD VI STRP: ORAL_STRIP | 12 refills | Status: DC

## 2018-03-23 NOTE — Telephone Encounter (Signed)
Ok. Please call in for test strips and lancets for this monitor for a year for this patient. Thanks

## 2018-03-23 NOTE — Telephone Encounter (Signed)
Pt has the one touch verio flex glucose meter and now need the strip and lancets to use the meter. Please advise so patient can check once a day.

## 2018-03-23 NOTE — Telephone Encounter (Signed)
Test strips and lancets has been sent for the pharmacy.

## 2018-04-07 ENCOUNTER — Encounter: Payer: BC Managed Care – PPO | Admitting: Family Medicine

## 2018-06-15 NOTE — Progress Notes (Signed)
Chief Complaint  Patient presents with  . Medication Management  . Rash    lower left arm x1 month     Patient presents for 3 month follow-up on her diabetes. At last visit her A1c was 7.1, she hadn't been checking her sugars, and had been drinking more soda (due to having it in the house while her son and his family was living with her (they moved out 7/31). She had just recently resumed a regular exercise program.  She had just started back walking (x 2 weeks prior to her physical).  Med changes were not made, hoping that with regular exercise, weight loss, and with monitoring her blood sugars she would see improvement. If not improved, then made changes are to be made today.  She continues with the [employer] training program, 2 days/week, on Tuesdays and Thursdays). Since school started back, she is only walking 1-3 other days on her own (total of 3-5x/week, had been walking 4d/week on her own prior to that).  She has been checking sugars.  Running around 99-131 in the mornings (averaging mid-120's). Only checks once daily.  Mid-day values (after eating) are up to 143.  She reports that she "did great" for about a month after her last visit. She started a 14 day without sugar program with her aunt, started this week. She admits that in anticipation of this diet, she ate extra sugar/sweets over the last 2 weeks.  Regular soda--cut back to 2/week, no longer has at home. She will have a strawberry lemonade when she goes out to eat.  ADHD needs refill on adderall. Only needing to take 1 pill daily, is effective, no side effects.  Rash x 1 month on her left arm. It is very itchy. Some days are worse than others. Son has eczema. No change in products. She tried OTC HC once daily x 3 days.  She even tried yeast cream once daily for a week.  Neither of these creams helped. Started out as a smaller spot, spread a little.  PMH, PSH, SH reviewed  Outpatient Encounter Medications as of  06/16/2018  Medication Sig Note  . amphetamine-dextroamphetamine (ADDERALL) 20 MG tablet Take 1 tablet (20 mg total) by mouth See admin instructions. Take 1.5-2 tablets in the morning and 1 tablet in the evening, as directed   . aspirin 81 MG EC tablet Take 1 tablet (81 mg total) by mouth daily. Swallow whole, take with food   . Cholecalciferol (VITAMIN D) 2000 units tablet Take 2,000 Units by mouth daily.   . Ferrous Sulfate (IRON) 325 (65 FE) MG TABS Take 1 tablet by mouth daily.  03/15/2018: daily  . fluticasone (FLONASE) 50 MCG/ACT nasal spray Place 2 sprays into both nostrils daily.   Marland Kitchen glucose blood (ONETOUCH VERIO) test strip Glucose check once daily   . losartan-hydrochlorothiazide (HYZAAR) 100-25 MG tablet Take 1 tablet by mouth daily.   . metFORMIN (GLUCOPHAGE-XR) 500 MG 24 hr tablet TAKE 2 TABLETS BY MOUTH EVERY DAY WITH BREAKFAST   . Multiple Vitamins-Minerals (MULTIVITAMIN WITH MINERALS) tablet Take 1 tablet by mouth daily. Reported on 01/02/2016   . ONETOUCH DELICA LANCETS 28M MISC Check Glucose once daily   . rosuvastatin (CRESTOR) 5 MG tablet Take 1 tablet (5 mg total) by mouth daily.   . [DISCONTINUED] amphetamine-dextroamphetamine (ADDERALL) 20 MG tablet Take 1 tablet (20 mg total) by mouth See admin instructions. Take 1.5-2 tablets in the morning and 1 tablet in the evening, as directed   .  triamcinolone cream (KENALOG) 0.1 % Apply sparingly to affected areas of skin twice daily for up to two weeks    Facility-Administered Encounter Medications as of 06/16/2018  Medication  . 0.9 %  sodium chloride infusion   (not using TAC prior to today's visit).  Allergies  Allergen Reactions  . Onglyza [Saxagliptin] Other (See Comments)    Eye irritation (when on [place]; tolerates metformin without problems)  Patient denies allergy to this product. States that it was another medication   ROS: no fever, chills, headaches, dizziness, chest pain, shortness of breath, edema,  hypoglycemia, polydipsia, polyuria, urinary complaints, bleeding, bruising. +rash per HPI.  Moods are good. No other complaints. See HPI.   PHYSICAL EXAM:  BP 132/82   Pulse 73   Temp 98.8 F (37.1 C) (Oral)   Ht 5\' 5"  (1.651 m)   Wt 273 lb (123.8 kg)   SpO2 98%   BMI 45.43 kg/m   Wt Readings from Last 3 Encounters:  06/16/18 273 lb (123.8 kg)  03/15/18 283 lb 12.8 oz (128.7 kg)  03/11/18 281 lb (127.5 kg)   Well developed, pleasant, obese female in good spirits, in no distress HEENT: conjunctiva and sclera are clear, EOMI. OP clear Neck: no lymphadenopathy or mass Heart: regular rate and rhythm Lungs: clear bilaterally Abdomen: soft, nontender, obese, no mass Extremities: no edema.  Skin: normal turgor. Left forearm, starting at lateral elbow area there are scattered raised patches that are slightly hypopigmented, and a many smaller papules. Slightly dry skin. Also some areas of hyperpigmentation within this area. (usually darker per pt).   Lab Results  Component Value Date   HGBA1C 6.9 (A) 06/16/2018     ASSESSMENT/PLAN:  Controlled type 2 diabetes mellitus without complication, without long-term current use of insulin (HCC) - continue diet, weight loss, exercise and current meds - Plan: HgB A1c  Attention deficit disorder, unspecified hyperactivity presence - doing well on current regimen, usually using just 1 pill daily on weekdays - Plan: amphetamine-dextroamphetamine (ADDERALL) 20 MG tablet  Needs flu shot - Plan: HgB A1c  Rash and nonspecific skin eruption - suspect poss eczema. moisturize regularly, TAC for up to 2 weeks max. f/u if not improving - Plan: triamcinolone cream (KENALOG) 0.1 %     [employer], covers shingrix. Not given today due to potential side effect, prefers to return for [place]   Use the steroid cream sparingly to the affected areas of skin on the arm, twice daily only for up to 2 weeks (stop when completely resolves). Keep the skin  well moisturized.  Continue regular exercise, weight loss, and limiting your sugars, sweets and carbs. Feel free to email/mail/fax your blood sugar list at any time for my review. Definitely bring it to your next visit.   F/u January

## 2018-06-16 ENCOUNTER — Encounter: Payer: Self-pay | Admitting: Family Medicine

## 2018-06-16 ENCOUNTER — Ambulatory Visit: Payer: BC Managed Care – PPO | Admitting: Family Medicine

## 2018-06-16 VITALS — BP 132/82 | HR 73 | Temp 98.8°F | Ht 65.0 in | Wt 273.0 lb

## 2018-06-16 DIAGNOSIS — Z23 Encounter for immunization: Secondary | ICD-10-CM | POA: Diagnosis not present

## 2018-06-16 DIAGNOSIS — E119 Type 2 diabetes mellitus without complications: Secondary | ICD-10-CM

## 2018-06-16 DIAGNOSIS — F988 Other specified behavioral and emotional disorders with onset usually occurring in childhood and adolescence: Secondary | ICD-10-CM | POA: Diagnosis not present

## 2018-06-16 DIAGNOSIS — R21 Rash and other nonspecific skin eruption: Secondary | ICD-10-CM

## 2018-06-16 LAB — POCT GLYCOSYLATED HEMOGLOBIN (HGB A1C): Hemoglobin A1C: 6.9 % — AB (ref 4.0–5.6)

## 2018-06-16 MED ORDER — TRIAMCINOLONE ACETONIDE 0.1 % EX CREA: TOPICAL_CREAM | CUTANEOUS | 0 refills | Status: DC

## 2018-06-16 MED ORDER — AMPHETAMINE-DEXTROAMPHETAMINE 20 MG PO TABS: 20.0000 mg | ORAL_TABLET | ORAL | 0 refills | Status: DC

## 2018-06-16 NOTE — Patient Instructions (Addendum)
Use the steroid cream sparingly to the affected areas of skin on the arm, twice daily only for up to 2 weeks (stop when completely resolves). Keep the skin well moisturized.  Continue regular exercise, weight loss, and limiting your sugars, sweets and carbs. Feel free to email/mail/fax your blood sugar list at any time for my review. Definitely bring it to your next visit.    MyPlate from USDA The general, healthful diet is based on the 2010 Dietary Guidelines for Americans. The amount of food you need to eat from each food group depends on your age, sex, and level of physical activity and can be individualized by a dietitian. Go to [email or web address] for more information. What do I need to know about the MyPlate plan?  Enjoy your food, but eat less.  Avoid oversized portions. ?  of your plate should include fruits and vegetables. ?  of your plate should be grains. ?  of your plate should be protein. Grains  Make at least half of your grains whole grains.  For a 2,000 calorie daily food plan, eat 6 oz every day.  1 oz is about 1 slice bread, 1 cup cereal, or  cup cooked rice, cereal, or pasta. Vegetables  Make half your plate fruits and vegetables.  For a 2,000 calorie daily food plan, eat 2 cups every day.  1 cup is about 1 cup raw or cooked vegetables or vegetable juice or 2 cups raw leafy greens. Fruits  Make half your plate fruits and vegetables.  For a 2,000 calorie daily food plan, eat 2 cups every day.  1 cup is about 1 cup fruit or 100% fruit juice or  cup dried fruit. Protein  For a 2,000 calorie daily food plan, eat 5 oz every day.  1 oz is about 1 oz meat, poultry, or fish,  cup cooked beans, 1 egg, 1 Tbsp peanut butter, or  oz nuts or seeds. Dairy  Switch to fat-free or low-fat (1%) milk.  For a 2,000 calorie daily food plan, eat 3 cups every day.  1 cup is about 1 cup milk or yogurt or soy milk (soy beverage), 1 oz natural cheese, or 2 oz  processed cheese. Fats, Oils, and Empty Calories  Only small amounts of oils are recommended.  Empty calories are calories from solid fats or added sugars.  Compare sodium in foods like soup, bread, and frozen meals. Choose the foods with lower numbers.  Drink water instead of sugary drinks. What foods can I eat? Grains Whole grains such as whole wheat, quinoa, millet, and bulgur. Bread, rolls, and pasta made from whole grains. Brown or wild rice. Hot or cold cereals made from whole grains and without added sugar. Vegetables All fresh vegetables, especially fresh red, dark green, or orange vegetables. Peas and beans. Low-sodium frozen or canned vegetables prepared without added salt. Low-sodium vegetable juices. Fruits All fresh, frozen, and dried fruits. Canned fruit packed in water or fruit juice without added sugar. Fruit juices without added sugar. Meats and Other Protein Sources Boiled, baked, or grilled lean meat trimmed of fat. Skinless poultry. Fresh seafood and shellfish. Canned seafood packed in water. Unsalted nuts and unsalted nut butters. Tofu. Dried beans and pea. Eggs. Dairy Low-fat or fat-free milk, yogurt, and cheeses. Sweets and Desserts Frozen desserts made from low-fat milk. Fats and Oils Olive, peanut, and canola oils and margarine. Salad dressing and mayonnaise made from these oils. Other Soups and casseroles made from allowed ingredients and without added fat  or salt. The items listed above may not be a complete list of recommended foods or beverages. Contact your dietitian for more options. What foods are not recommended? Grains Sweetened, low-fiber cereals. Packaged baked goods. Snack crackers and chips. Cheese crackers, butter crackers, and biscuits. Frozen waffles, sweet breads, doughnuts, pastries, packaged baking mixes, pancakes, cakes, and cookies. Vegetables Regular canned or frozen vegetables or vegetables prepared with salt. Canned tomatoes. Canned  tomato sauce. Fried vegetables. Vegetables in cream sauce or cheese sauce. Fruits Fruits packed in syrup or made with added sugar. Meats and Other Protein Sources Marbled or fatty meats such as ribs. Poultry with skin. Fried meats, poultry, eggs, or fish. Sausages, hot dogs, and deli meats such as pastrami, bologna, or salami. Dairy Whole milk, cream, cheeses made from whole milk, sour cream. Ice cream or yogurt made from whole milk or with added sugar. Beverages For adults, no more than one alcoholic drink per day. Regular soft drinks or other sugary beverages. Juice drinks. Sweets and Desserts Sugary or fatty desserts, candy, and other sweets. Fats and Oils Solid shortening or partially hydrogenated oils. Solid margarine. Margarine that contains trans fats. Butter. The items listed above may not be a complete list of foods and beverages to avoid. Contact your dietitian for more information. This information is not intended to replace advice given to you by your health care provider. Make sure you discuss any questions you have with your health care provider. Document Released: 09/28/2007 Document Revised: 02/14/2016 Document Reviewed: 08/17/2013 Elsevier Interactive Patient Education  [employer].

## 2018-07-02 ENCOUNTER — Other Ambulatory Visit (INDEPENDENT_AMBULATORY_CARE_PROVIDER_SITE_OTHER): Payer: BC Managed Care – PPO

## 2018-07-02 DIAGNOSIS — Z23 Encounter for immunization: Secondary | ICD-10-CM

## 2018-08-23 ENCOUNTER — Telehealth: Payer: Self-pay

## 2018-08-23 MED ORDER — HYDROCHLOROTHIAZIDE 25 MG PO TABS: 25.0000 mg | ORAL_TABLET | Freq: Every day | ORAL | 0 refills | Status: DC

## 2018-08-23 MED ORDER — LOSARTAN POTASSIUM 100 MG PO TABS: 100.0000 mg | ORAL_TABLET | Freq: Every day | ORAL | 0 refills | Status: DC

## 2018-08-23 NOTE — Telephone Encounter (Signed)
Pharmacy has sent a fa x requesting Losartan and Hydrochlorothiazide be sent separately because the combo is on backorder. They also suggested that only a 30 day rx is needed

## 2018-08-23 NOTE — Telephone Encounter (Signed)
Done

## 2018-08-23 NOTE — Telephone Encounter (Signed)
Ok, please do.

## 2018-09-12 NOTE — Progress Notes (Signed)
Chief Complaint  Patient presents with  . Diabetes    nonfasting med check. Also has lump left breast that she noticed Dec 7th, seems like it is getting larger.    She first noticed a lump in the left breast on 12/7, when doing her routine breast exam.  Denies any tenderness. It is somewhat near an area that was biopsied in the past. Denies nipple discharge, no dimpling of the skin.  She hadn't had a period since September, but 12/18 she started heavy bleeding.  It was extremely heavy x 2.5 days, and is now very light.  Patient presents for 3 month follow-up on her diabetes. (she was originally scheduled in January, moved her appt up when she noticed the breast lump). In June, her A1c was up to 7.1%--she hadn't been checking her sugars, and had been drinking more soda (due to having it in the house while her son and his family was living with her (they moved out 7/31).  In September she reported being on a regular exercise program, checking her sugars, no longer has regular soda in her home. She will have a strawberry lemonade when she goes out to eat.  On last check, A1c was 6.9% (September).  Today she reports she "fell off the bandwagon"--craving more sweet and salty for the last 3 weeks, not sure if hormones are contributing. She has been craving pickles, and feels hungry all the time.  Hasn't been exercising as much since it got colder--only once a week.  She had been exercising 4-6 days/week, only once a week since Thanksgiving. She hasn't been checking her sugars.  ADHD: Doing well on Adderall. Only needing to take 1 pill daily (Monday through Friday, rarely needs on the weekend), is effective, no side effects. Refill is needed.  Hyperlipidemia:She is compliant with taking Crestor without myalgias (previously didn't tolerate atorvastatin due to myalgias).  Lab Results  Component Value Date   CHOL 106 03/15/2018   HDL 33 (L) 03/15/2018   LDLCALC 53 03/15/2018   TRIG 100 03/15/2018    CHOLHDL 3.2 03/15/2018   Hypertension-- She reports compliance with her medications (recently was separated due backorder from pharm on the combo pill).  She hasn't been checking her BP's, because it had been good.  She knows she has been eating more sodium recently. Denies any headaches, chest pain, palpitations, edema.  Allergies: Has been using her Flonase regularly, which has been effective. Currentlynot needing an OTC antihistamine.  PMH, PSH, SH reviewed  Outpatient Encounter Medications as of 09/13/2018  Medication Sig Note  . amphetamine-dextroamphetamine (ADDERALL) 20 MG tablet Take 1 tablet (20 mg total) by mouth See admin instructions. Take 1.5-2 tablets in the morning and 1 tablet in the evening, as directed   . aspirin 81 MG EC tablet Take 1 tablet (81 mg total) by mouth daily. Swallow whole, take with food   . Cholecalciferol (VITAMIN D) 2000 units tablet Take 2,000 Units by mouth daily.   . Ferrous Sulfate (IRON) 325 (65 FE) MG TABS Take 1 tablet by mouth daily.  09/13/2018: Takes it MWF  . fluticasone (FLONASE) 50 MCG/ACT nasal spray Place 2 sprays into both nostrils daily.   Marland Kitchen glucose blood (ONETOUCH VERIO) test strip Glucose check once daily   . hydrochlorothiazide (HYDRODIURIL) 25 MG tablet Take 1 tablet (25 mg total) by mouth daily.   Marland Kitchen losartan (COZAAR) 100 MG tablet Take 1 tablet (100 mg total) by mouth daily.   . metFORMIN (GLUCOPHAGE-XR) 500 MG 24 hr  tablet TAKE 2 TABLETS BY MOUTH EVERY DAY WITH BREAKFAST   . Multiple Vitamins-Minerals (MULTIVITAMIN WITH MINERALS) tablet Take 1 tablet by mouth daily. Reported on 01/02/2016   . ONETOUCH DELICA LANCETS 40J MISC Check Glucose once daily   . rosuvastatin (CRESTOR) 5 MG tablet Take 1 tablet (5 mg total) by mouth daily.   Marland Kitchen triamcinolone cream (KENALOG) 0.1 % Apply sparingly to affected areas of skin twice daily for up to two weeks (Patient not taking: Reported on 09/13/2018)   . [DISCONTINUED]  losartan-hydrochlorothiazide (HYZAAR) 100-25 MG tablet Take 1 tablet by mouth daily.    Facility-Administered Encounter Medications as of 09/13/2018  Medication  . 0.9 %  sodium chloride infusion   Allergies  Allergen Reactions  . Onglyza [Saxagliptin] Other (See Comments)    Eye irritation (when on [place]; tolerates metformin without problems)  Patient denies allergy to this product. States that it was another medication    ROS: no fever, chills, headaches, chest pain, shortness of breath, URI symptoms, bleeding (just menstrual), bruising, rashes or other complaints See HPI.   PHYSICAL EXAM:  BP 138/90   Pulse 80   Ht 5' 5"  (1.651 m)   Wt 276 lb 9.6 oz (125.5 kg)   LMP 09/08/2018 (Exact Date)   BMI 46.03 kg/m   160/98 on repeat by MD  Wt Readings from Last 3 Encounters:  09/13/18 276 lb 9.6 oz (125.5 kg)  06/16/18 273 lb (123.8 kg)  03/15/18 283 lb 12.8 oz (128.7 kg)   Well developed, pleasant, obese female in good spirits, in no distress HEENT: conjunctiva and sclera are clear, EOMI. OP clear Neck: no lymphadenopathy or mass Heart: regular rate and rhythm Lungs: clear bilaterally Abdomen: soft, nontender, obese, no mass Back: no spinal or CVA tenderness Extremities: no edema.  Skin: normal turgor, no visible rash Psych: normal mood, affect, hygiene and grooming Neuro: alert and oriented, normal gait  Breast--Left: Well defined palpable mass between 12 and 1 o'clock position on left superior breast.  Mobile, nontender. No lymphadenopathy or other masses noted.   Lab Results  Component Value Date   HGBA1C 7.0 (A) 09/13/2018    ASSESSMENT/PLAN:  Controlled type 2 diabetes mellitus without complication, without long-term current use of insulin (HCC) - A1c up slightly--counseled re: diet, exercise, portions, wt loss.  Declines increasing metformin dose or returning for 3 month f/u - Plan: HgB A1c, Comprehensive metabolic panel, metFORMIN (GLUCOPHAGE-XR) 500 MG  24 hr tablet, rosuvastatin (CRESTOR) 5 MG tablet  Attention deficit disorder, unspecified hyperactivity presence - well controlled on current regimen - Plan: amphetamine-dextroamphetamine (ADDERALL) 20 MG tablet  Essential hypertension, benign - elevated today--counseled re: low sodium diet, regular exercise, wt loss, and monitoring BP. return sooner if BP's remain elevated - Plan: Comprehensive metabolic panel, losartan-hydrochlorothiazide (HYZAAR) 100-25 MG tablet  Dyslipidemia - continue rosuvastatin - Plan: rosuvastatin (CRESTOR) 5 MG tablet  Need for shingles vaccine - Plan: Varicella-zoster vaccine IM (Shingrix)  Medication monitoring encounter - Plan: Comprehensive metabolic panel  Left breast mass - Plan: MM DIAG BREAST TOMO UNI LEFT, US BREAST LTD UNI LEFT INC AXILLA, CANCELED: MS DIGITAL DIAG TOMO UNI LEFT, CANCELED: US BREAST COMPLETE UNI LEFT INC AXILLA   c-met (not fasting, grabbed something to snack on) 2nd Shingrix today (side effects reviewed)  Declines increasing metformin to 3/d  Declines 3 month med check F/u CPE 6 mos

## 2018-09-13 ENCOUNTER — Encounter: Payer: Self-pay | Admitting: Family Medicine

## 2018-09-13 ENCOUNTER — Ambulatory Visit: Payer: BC Managed Care – PPO | Admitting: Family Medicine

## 2018-09-13 VITALS — BP 138/90 | HR 80 | Ht 65.0 in | Wt 276.6 lb

## 2018-09-13 DIAGNOSIS — E785 Hyperlipidemia, unspecified: Secondary | ICD-10-CM | POA: Diagnosis not present

## 2018-09-13 DIAGNOSIS — Z23 Encounter for immunization: Secondary | ICD-10-CM

## 2018-09-13 DIAGNOSIS — F988 Other specified behavioral and emotional disorders with onset usually occurring in childhood and adolescence: Secondary | ICD-10-CM | POA: Diagnosis not present

## 2018-09-13 DIAGNOSIS — I10 Essential (primary) hypertension: Secondary | ICD-10-CM

## 2018-09-13 DIAGNOSIS — Z5181 Encounter for therapeutic drug level monitoring: Secondary | ICD-10-CM

## 2018-09-13 DIAGNOSIS — N6320 Unspecified lump in the left breast, unspecified quadrant: Secondary | ICD-10-CM

## 2018-09-13 DIAGNOSIS — E119 Type 2 diabetes mellitus without complications: Secondary | ICD-10-CM | POA: Diagnosis not present

## 2018-09-13 LAB — POCT GLYCOSYLATED HEMOGLOBIN (HGB A1C): Hemoglobin A1C: 7 % — AB (ref 4.0–5.6)

## 2018-09-13 MED ORDER — METFORMIN HCL ER 500 MG PO TB24: ORAL_TABLET | ORAL | 1 refills | Status: DC

## 2018-09-13 MED ORDER — AMPHETAMINE-DEXTROAMPHETAMINE 20 MG PO TABS: 20.0000 mg | ORAL_TABLET | ORAL | 0 refills | Status: DC

## 2018-09-13 MED ORDER — ROSUVASTATIN CALCIUM 5 MG PO TABS
5.0000 mg | ORAL_TABLET | Freq: Every day | ORAL | 1 refills | Status: DC
Start: 2018-09-13 — End: 2019-03-28

## 2018-09-13 MED ORDER — LOSARTAN POTASSIUM-HCTZ 100-25 MG PO TABS: 1.0000 | ORAL_TABLET | Freq: Every day | ORAL | 1 refills | Status: DC

## 2018-09-13 NOTE — Patient Instructions (Signed)
Time to get back to a regular exercise routine, cut back on the salt, monitor your blood pressure and your blood sugar. If your blood sugars are consistently over 140 in the mornings, please let us know (don't wait until your June appointment, as I would want to increase your metformin dose).  Your blood pressure was very high today--please really watch the sodium in the diet (and get regular exercise, as we discussed).  Try and drink more water--your hunger may actually be thirst.   Low-Sodium Eating Plan Sodium, which is an element that makes up salt, helps you maintain a healthy balance of fluids in your body. Too much sodium can increase your blood pressure and cause fluid and waste to be held in your body. Your health care provider or dietitian may recommend following this plan if you have high blood pressure (hypertension), kidney disease, liver disease, or heart failure. Eating less sodium can help lower your blood pressure, reduce swelling, and protect your heart, liver, and kidneys. What are tips for following this plan? General guidelines  Most people on this plan should limit their sodium intake to 1,500-2,000 mg (milligrams) of sodium each day. Reading food labels   The Nutrition Facts label lists the amount of sodium in one serving of the food. If you eat more than one serving, you must multiply the listed amount of sodium by the number of servings.  Choose foods with less than 140 mg of sodium per serving.  Avoid foods with 300 mg of sodium or more per serving. Shopping  Look for lower-sodium products, often labeled as "low-sodium" or "no salt added."  Always check the sodium content even if foods are labeled as "unsalted" or "no salt added".  Buy fresh foods. ? Avoid canned foods and premade or frozen meals. ? Avoid canned, cured, or processed meats  Buy breads that have less than 80 mg of sodium per slice. Cooking  Eat more home-cooked food and less restaurant,  buffet, and fast food.  Avoid adding salt when cooking. Use salt-free seasonings or herbs instead of table salt or sea salt. Check with your health care provider or pharmacist before using salt substitutes.  Cook with plant-based oils, such as canola, sunflower, or olive oil. Meal planning  When eating at a restaurant, ask that your food be prepared with less salt or no salt, if possible.  Avoid foods that contain MSG (monosodium glutamate). MSG is sometimes added to [place] food, bouillon, and some canned foods. What foods are recommended? The items listed may not be a complete list. Talk with your dietitian about what dietary choices are best for you. Grains Low-sodium cereals, including oats, puffed wheat and rice, and shredded wheat. Low-sodium crackers. Unsalted rice. Unsalted pasta. Low-sodium bread. Whole-grain breads and whole-grain pasta. Vegetables Fresh or frozen vegetables. "No salt added" canned vegetables. "No salt added" tomato sauce and paste. Low-sodium or reduced-sodium tomato and vegetable juice. Fruits Fresh, frozen, or canned fruit. Fruit juice. Meats and other protein foods Fresh or frozen (no salt added) meat, poultry, seafood, and fish. Low-sodium canned tuna and salmon. Unsalted nuts. Dried peas, beans, and lentils without added salt. Unsalted canned beans. Eggs. Unsalted nut butters. Dairy Milk. Soy milk. Cheese that is naturally low in sodium, such as ricotta cheese, fresh mozzarella, or Swiss cheese Low-sodium or reduced-sodium cheese. Cream cheese. Yogurt. Fats and oils Unsalted butter. Unsalted margarine with no trans fat. Vegetable oils such as canola or olive oils. Seasonings and other foods Fresh and dried herbs  and spices. Salt-free seasonings. Low-sodium mustard and ketchup. Sodium-free salad dressing. Sodium-free light mayonnaise. Fresh or refrigerated horseradish. Lemon juice. Vinegar. Homemade, reduced-sodium, or low-sodium soups. Unsalted popcorn and  pretzels. Low-salt or salt-free chips. What foods are not recommended? The items listed may not be a complete list. Talk with your dietitian about what dietary choices are best for you. Grains Instant hot cereals. Bread stuffing, pancake, and biscuit mixes. Croutons. Seasoned rice or pasta mixes. Noodle soup cups. Boxed or frozen macaroni and cheese. Regular salted crackers. Self-rising flour. Vegetables Sauerkraut, pickled vegetables, and relishes. Olives. [place] fries. Onion rings. Regular canned vegetables (not low-sodium or reduced-sodium). Regular canned tomato sauce and paste (not low-sodium or reduced-sodium). Regular tomato and vegetable juice (not low-sodium or reduced-sodium). Frozen vegetables in sauces. Meats and other protein foods Meat or fish that is salted, canned, smoked, spiced, or pickled. Bacon, ham, sausage, hotdogs, corned beef, chipped beef, packaged lunch meats, salt pork, jerky, pickled herring, anchovies, regular canned tuna, sardines, salted nuts. Dairy Processed cheese and cheese spreads. Cheese curds. Blue cheese. Feta cheese. String cheese. Regular cottage cheese. Buttermilk. Canned milk. Fats and oils Salted butter. Regular margarine. Ghee. Bacon fat. Seasonings and other foods Onion salt, garlic salt, seasoned salt, table salt, and sea salt. Canned and packaged gravies. Worcestershire sauce. Tartar sauce. Barbecue sauce. Teriyaki sauce. Soy sauce, including reduced-sodium. Steak sauce. Fish sauce. Oyster sauce. Cocktail sauce. Horseradish that you find on the shelf. Regular ketchup and mustard. Meat flavorings and tenderizers. Bouillon cubes. Hot sauce and Tabasco sauce. Premade or packaged marinades. Premade or packaged taco seasonings. Relishes. Regular salad dressings. Salsa. Potato and tortilla chips. Corn chips and puffs. Salted popcorn and pretzels. Canned or dried soups. Pizza. Frozen entrees and pot pies. Summary  Eating less sodium can help lower your blood  pressure, reduce swelling, and protect your heart, liver, and kidneys.  Most people on this plan should limit their sodium intake to 1,500-2,000 mg (milligrams) of sodium each day.  Canned, boxed, and frozen foods are high in sodium. Restaurant foods, fast foods, and pizza are also very high in sodium. You also get sodium by adding salt to food.  Try to cook at home, eat more fresh fruits and vegetables, and eat less fast food, canned, processed, or prepared foods. This information is not intended to replace advice given to you by your health care provider. Make sure you discuss any questions you have with your health care provider. Document Released: 02/28/2002 Document Revised: 09/01/2016 Document Reviewed: 09/01/2016 Elsevier Interactive Patient Education  2019 [employer].

## 2018-09-14 ENCOUNTER — Other Ambulatory Visit: Payer: Self-pay | Admitting: Family Medicine

## 2018-09-14 LAB — COMPREHENSIVE METABOLIC PANEL
A/G RATIO: 1.5 (ref 1.2–2.2)
ALBUMIN: 4 g/dL (ref 3.5–5.5)
ALT: 14 IU/L (ref 0–32)
AST: 15 IU/L (ref 0–40)
Alkaline Phosphatase: 59 IU/L (ref 39–117)
BUN / CREAT RATIO: 15 (ref 9–23)
BUN: 11 mg/dL (ref 6–24)
Bilirubin Total: 0.2 mg/dL (ref 0.0–1.2)
CO2: 25 mmol/L (ref 20–29)
CREATININE: 0.75 mg/dL (ref 0.57–1.00)
Calcium: 9 mg/dL (ref 8.7–10.2)
Chloride: 103 mmol/L (ref 96–106)
GFR calc Af Amer: 107 mL/min/{1.73_m2} (ref >59)
GFR calc non Af Amer: 93 mL/min/{1.73_m2} (ref >59)
GLOBULIN, TOTAL: 2.6 g/dL (ref 1.5–4.5)
Glucose: 133 mg/dL — ABNORMAL HIGH (ref 65–99)
POTASSIUM: 3.7 mmol/L (ref 3.5–5.2)
SODIUM: 141 mmol/L (ref 134–144)
Total Protein: 6.6 g/dL (ref 6.0–8.5)

## 2018-09-15 ENCOUNTER — Other Ambulatory Visit: Payer: Self-pay | Admitting: Family Medicine

## 2018-09-16 NOTE — Telephone Encounter (Signed)
She usually takes the combo med, which was split last month due to a backorder.  I refilled the combo when she was here.  Not sure if that isn't available, or was this auto-refill?

## 2018-09-16 NOTE — Telephone Encounter (Signed)
Can I refill this until next visit?

## 2018-09-21 ENCOUNTER — Ambulatory Visit
Admission: RE | Admit: 2018-09-21 | Discharge: 2018-09-21 | Disposition: A | Discharge status: Home / Self Care | Payer: BC Managed Care – PPO | Source: Ambulatory Visit | Attending: Family Medicine | Admitting: Family Medicine

## 2018-09-21 DIAGNOSIS — N6320 Unspecified lump in the left breast, unspecified quadrant: Secondary | ICD-10-CM

## 2018-09-27 ENCOUNTER — Encounter: Payer: BC Managed Care – PPO | Admitting: Family Medicine

## 2018-10-08 ENCOUNTER — Other Ambulatory Visit: Payer: Self-pay | Admitting: Surgery

## 2019-01-19 ENCOUNTER — Ambulatory Visit: Payer: BC Managed Care – PPO | Admitting: Obstetrics and Gynecology

## 2019-01-20 ENCOUNTER — Telehealth: Payer: Self-pay | Admitting: Family Medicine

## 2019-01-20 ENCOUNTER — Other Ambulatory Visit: Payer: Self-pay | Admitting: Surgery

## 2019-01-20 DIAGNOSIS — F988 Other specified behavioral and emotional disorders with onset usually occurring in childhood and adolescence: Secondary | ICD-10-CM

## 2019-01-20 MED ORDER — AMPHETAMINE-DEXTROAMPHETAMINE 20 MG PO TABS: 20.0000 mg | ORAL_TABLET | ORAL | 0 refills | Status: DC

## 2019-01-20 NOTE — Telephone Encounter (Signed)
Pt called and is requesting a refill on her adderall 20 mg pt needs it to go to the [id number] - [place], [place] - [address]

## 2019-01-25 ENCOUNTER — Telehealth: Payer: Self-pay | Admitting: Family Medicine

## 2019-01-25 NOTE — Telephone Encounter (Signed)
P.A. ADDERALL  

## 2019-02-01 ENCOUNTER — Encounter (HOSPITAL_COMMUNITY): Payer: Self-pay

## 2019-02-01 ENCOUNTER — Ambulatory Visit (HOSPITAL_COMMUNITY): Admit: 2019-02-01 | Payer: BC Managed Care – PPO | Admitting: Surgery

## 2019-02-01 SURGERY — BREAST LUMPECTOMY
Anesthesia: General | Site: Breast | Laterality: Right

## 2019-02-04 NOTE — Telephone Encounter (Signed)
P.A. approved til 01/24/22, pt informed

## 2019-02-15 ENCOUNTER — Ambulatory Visit: Payer: BC Managed Care – PPO | Admitting: Podiatry

## 2019-02-15 ENCOUNTER — Encounter: Payer: Self-pay | Admitting: Podiatry

## 2019-02-15 ENCOUNTER — Ambulatory Visit (INDEPENDENT_AMBULATORY_CARE_PROVIDER_SITE_OTHER): Payer: BC Managed Care – PPO

## 2019-02-15 ENCOUNTER — Other Ambulatory Visit: Payer: Self-pay

## 2019-02-15 VITALS — Temp 97.7°F

## 2019-02-15 DIAGNOSIS — M722 Plantar fascial fibromatosis: Secondary | ICD-10-CM

## 2019-02-15 DIAGNOSIS — L600 Ingrowing nail: Secondary | ICD-10-CM | POA: Diagnosis not present

## 2019-02-15 MED ORDER — TRIAMCINOLONE ACETONIDE 10 MG/ML IJ SUSP
10.0000 mg | Freq: Once | INTRAMUSCULAR | Status: AC
  Administered 2019-02-15: 10 mg

## 2019-02-15 MED ORDER — MELOXICAM 15 MG PO TABS: 15.0000 mg | ORAL_TABLET | Freq: Every day | ORAL | 0 refills | Status: DC

## 2019-02-15 NOTE — Patient Instructions (Signed)
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
## 2019-03-08 ENCOUNTER — Other Ambulatory Visit: Payer: Self-pay | Admitting: Podiatry

## 2019-03-08 ENCOUNTER — Other Ambulatory Visit: Payer: Self-pay

## 2019-03-08 ENCOUNTER — Encounter: Payer: Self-pay | Admitting: Podiatry

## 2019-03-08 ENCOUNTER — Ambulatory Visit: Payer: BC Managed Care – PPO | Admitting: Podiatry

## 2019-03-08 VITALS — Temp 97.6°F

## 2019-03-08 DIAGNOSIS — M722 Plantar fascial fibromatosis: Secondary | ICD-10-CM | POA: Diagnosis not present

## 2019-03-08 DIAGNOSIS — L600 Ingrowing nail: Secondary | ICD-10-CM | POA: Diagnosis not present

## 2019-03-08 MED ORDER — CEPHALEXIN 500 MG PO CAPS: 500.0000 mg | ORAL_CAPSULE | Freq: Three times a day (TID) | ORAL | 0 refills | Status: DC

## 2019-03-08 NOTE — Patient Instructions (Signed)

## 2019-03-14 NOTE — Progress Notes (Signed)
Subjective: 53 year old female presents the office today requesting partial nail avulsion of her right big toe, lateral aspect.  She states that she has had this performed today as the nail is painful.  She denies any drainage or pus.  She states that in general her foot feels significantly better.  She states majority pain has resolved. She is occasional discomfort on the big toe but overall doing much better. Denies any systemic complaints such as fevers, chills, nausea, vomiting. No acute changes since last appointment, and no other complaints at this time.   Objective: AAO x3, NAD DP/PT pulses palpable bilaterally, CRT less than 3 seconds Incurvation present the lateral aspect the right hallux toenail with tenderness palpation.  Nails mildly dystrophic with yellow-brown discoloration as well.  There is no drainage pus identified today.   At this time there is no tenderness palpation of the plantar medial tubercle of the calcaneus at the insertion plantar fascia.  Fascia, Achilles tendon intact.  No open lesions or pre-ulcerative lesions.  No pain with calf compression, swelling, warmth, erythema  Assessment: 53 year old female right lateral hallux symptomatic ingrown toenail, resolving plantar fasciitis  Plan: -All treatment options discussed with the patient including all alternatives, risks, complications.  -In regard to the point that she has continue with stretching, icing daily.  Discussed supportive shoes and orthotics. -At this time, the patient is requesting partial nail removal with chemical matricectomy to the symptomatic portion of the nail. Risks and complications were discussed with the patient for which they understand and written consent was obtained. Under sterile conditions a total of 3 mL of a mixture of 2% lidocaine plain and 0.5% Marcaine plain was infiltrated in a hallux block fashion. Once anesthetized, the skin was prepped in sterile fashion. A tourniquet was then applied.  Next the lateral aspect of hallux nail border was then sharply excised making sure to remove the entire offending nail border. Once the nails were ensured to be removed area was debrided and the underlying skin was intact. There is no purulence identified in the procedure. Next phenol was then applied under standard conditions and copiously irrigated. Silvadene was applied. A dry sterile dressing was applied. After application of the dressing the tourniquet was removed and there is found to be an immediate capillary refill time to the digit. The patient tolerated the procedure well any complications. Post procedure instructions were discussed the patient for which he verbally understood. Follow-up in one week for nail check or sooner if any problems are to arise. Discussed signs/symptoms of infection and directed to call the office immediately should any occur or go directly to the emergency room. In the meantime, encouraged to call the office with any questions, concerns, changes symptoms. -Patient encouraged to call the office with any questions, concerns, change in symptoms.   Trula Slade DPM

## 2019-03-25 NOTE — Patient Instructions (Addendum)
  HEALTH MAINTENANCE RECOMMENDATIONS:  It is recommended that you get at least 30 minutes of aerobic exercise at least 5 days/week (for weight loss, you may need as much as 60-90 minutes). This can be any activity that gets your heart rate up. This can be divided in 10-15 minute intervals if needed, but try and build up your endurance at least once a week.  Weight bearing exercise is also recommended twice weekly.  Eat a healthy diet with lots of vegetables, fruits and fiber.  "Colorful" foods have a lot of vitamins (ie green vegetables, tomatoes, red peppers, etc).  Limit sweet tea, regular sodas and alcoholic beverages, all of which has a lot of calories and sugar.  Up to 1 alcoholic drink daily may be beneficial for women (unless trying to lose weight, watch sugars).  Drink a lot of water.  Calcium recommendations are 1200-1500 mg daily (1500 mg for postmenopausal women or women without ovaries), and vitamin D 1000 IU daily.  This should be obtained from diet and/or supplements (vitamins), and calcium should not be taken all at once, but in divided doses.  Monthly self breast exams and yearly mammograms for women over the age of 28 is recommended.  Sunscreen of at least SPF 30 should be used on all sun-exposed parts of the skin when outside between the hours of 10 am and 4 pm (not just when at beach or pool, but even with exercise, golf, tennis, and yard work!)  Use a sunscreen that says "broad spectrum" so it covers both UVA and UVB rays, and make sure to reapply every 1-2 hours.  Remember to change the batteries in your smoke detectors when changing your clock times in the spring and fall. Carbon monoxide detectors are recommended for your home.  Use your seat belt every time you are in a car, and please drive safely and not be distracted with cell phones and texting while driving.  Please schedule yearly diabetic eye exam. Please reschedule GYN exam with Dr. Quincy Simmonds Please schedule your  bilateral mammogram   Try and use tylenol Arthritis as needed for pain. The meloxicam should be just for short courses, to truly treat inflammatory process. It can cause GI risks (ulcers, gastritis) and affect the kidneys adversely so not recommended to use long-term or regularly.  We are adding Trulicity once weekly injection to your metformin in order to get better control of your diabetes. Please cut out cookies, ice cream and other carb excesses. Resume daily exercise as tolerated.  Send in sugar list (using columns for morning and evening) in 1 month. Return in 3 months and bring list of sugars (so that we can increase the dose after 4 weeks, if needed).
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
## 2019-03-28 ENCOUNTER — Ambulatory Visit: Payer: BC Managed Care – PPO | Admitting: Family Medicine

## 2019-03-28 ENCOUNTER — Other Ambulatory Visit: Payer: Self-pay

## 2019-03-28 ENCOUNTER — Encounter: Payer: Self-pay | Admitting: Family Medicine

## 2019-03-28 VITALS — BP 138/84 | HR 80 | Temp 97.7°F | Ht 65.0 in | Wt 280.2 lb

## 2019-03-28 DIAGNOSIS — F988 Other specified behavioral and emotional disorders with onset usually occurring in childhood and adolescence: Secondary | ICD-10-CM | POA: Diagnosis not present

## 2019-03-28 DIAGNOSIS — E119 Type 2 diabetes mellitus without complications: Secondary | ICD-10-CM | POA: Diagnosis not present

## 2019-03-28 DIAGNOSIS — Z0000 Encounter for general adult medical examination without abnormal findings: Secondary | ICD-10-CM | POA: Diagnosis not present

## 2019-03-28 DIAGNOSIS — E1159 Type 2 diabetes mellitus with other circulatory complications: Secondary | ICD-10-CM | POA: Diagnosis not present

## 2019-03-28 DIAGNOSIS — I10 Essential (primary) hypertension: Secondary | ICD-10-CM

## 2019-03-28 DIAGNOSIS — D242 Benign neoplasm of left breast: Secondary | ICD-10-CM

## 2019-03-28 DIAGNOSIS — E785 Hyperlipidemia, unspecified: Secondary | ICD-10-CM

## 2019-03-28 DIAGNOSIS — Z6841 Body Mass Index (BMI) 40.0 and over, adult: Secondary | ICD-10-CM

## 2019-03-28 DIAGNOSIS — E1165 Type 2 diabetes mellitus with hyperglycemia: Secondary | ICD-10-CM

## 2019-03-28 LAB — POCT GLYCOSYLATED HEMOGLOBIN (HGB A1C): Hemoglobin A1C: 8.3 % — AB (ref 4.0–5.6)

## 2019-03-28 MED ORDER — METFORMIN HCL ER 500 MG PO TB24: ORAL_TABLET | ORAL | 1 refills | Status: DC

## 2019-03-28 MED ORDER — ROSUVASTATIN CALCIUM 5 MG PO TABS: 5.0000 mg | ORAL_TABLET | Freq: Every day | ORAL | 1 refills | Status: DC

## 2019-03-28 MED ORDER — LOSARTAN POTASSIUM-HCTZ 100-25 MG PO TABS: 1.0000 | ORAL_TABLET | Freq: Every day | ORAL | 1 refills | Status: DC

## 2019-03-28 MED ORDER — TRULICITY 0.75 MG/0.5ML ~~LOC~~ SOAJ: 0.7500 mg | SUBCUTANEOUS | 0 refills | Status: DC

## 2019-03-29 LAB — LIPID PANEL
Chol/HDL Ratio: 3.8 ratio (ref 0.0–4.4)
Cholesterol, Total: 133 mg/dL (ref 100–199)
HDL: 35 mg/dL — ABNORMAL LOW (ref >39)
LDL Calculated: 76 mg/dL (ref 0–99)
Triglycerides: 110 mg/dL (ref 0–149)
VLDL Cholesterol Cal: 22 mg/dL (ref 5–40)

## 2019-03-29 LAB — CBC WITH DIFFERENTIAL/PLATELET
Basophils Absolute: 0 10*3/uL (ref 0.0–0.2)
Basos: 1 %
EOS (ABSOLUTE): 0.2 10*3/uL (ref 0.0–0.4)
Eos: 2 %
Hematocrit: 36.4 % (ref 34.0–46.6)
Hemoglobin: 11.9 g/dL (ref 11.1–15.9)
Immature Grans (Abs): 0 10*3/uL (ref 0.0–0.1)
Immature Granulocytes: 0 %
Lymphocytes Absolute: 1.9 10*3/uL (ref 0.7–3.1)
Lymphs: 28 %
MCH: 26.8 pg (ref 26.6–33.0)
MCHC: 32.7 g/dL (ref 31.5–35.7)
MCV: 82 fL (ref 79–97)
Monocytes Absolute: 0.3 10*3/uL (ref 0.1–0.9)
Monocytes: 4 %
Neutrophils Absolute: 4.3 10*3/uL (ref 1.4–7.0)
Neutrophils: 65 %
Platelets: 279 10*3/uL (ref 150–450)
RBC: 4.44 x10E6/uL (ref 3.77–5.28)
RDW: 15 % (ref 11.7–15.4)
WBC: 6.6 10*3/uL (ref 3.4–10.8)

## 2019-03-29 LAB — COMPREHENSIVE METABOLIC PANEL
ALT: 15 IU/L (ref 0–32)
AST: 11 IU/L (ref 0–40)
Albumin/Globulin Ratio: 1.4 (ref 1.2–2.2)
Albumin: 4.2 g/dL (ref 3.8–4.9)
Alkaline Phosphatase: 70 IU/L (ref 39–117)
BUN/Creatinine Ratio: 13 (ref 9–23)
BUN: 13 mg/dL (ref 6–24)
Bilirubin Total: 0.3 mg/dL (ref 0.0–1.2)
CO2: 25 mmol/L (ref 20–29)
Calcium: 9.6 mg/dL (ref 8.7–10.2)
Chloride: 104 mmol/L (ref 96–106)
Creatinine, Ser: 0.97 mg/dL (ref 0.57–1.00)
GFR calc Af Amer: 78 mL/min/{1.73_m2} (ref >59)
GFR calc non Af Amer: 68 mL/min/{1.73_m2} (ref >59)
Globulin, Total: 3 g/dL (ref 1.5–4.5)
Glucose: 133 mg/dL — ABNORMAL HIGH (ref 65–99)
Potassium: 4 mmol/L (ref 3.5–5.2)
Sodium: 142 mmol/L (ref 134–144)
Total Protein: 7.2 g/dL (ref 6.0–8.5)

## 2019-03-29 LAB — MICROALBUMIN / CREATININE URINE RATIO
Creatinine, Urine: 343 mg/dL
Microalb/Creat Ratio: 6 mg/g creat (ref 0–29)
Microalbumin, Urine: 19.2 ug/mL

## 2019-03-29 LAB — TSH: TSH: 1.19 u[IU]/mL (ref 0.450–4.500)

## 2019-03-29 LAB — HIV ANTIBODY (ROUTINE TESTING W REFLEX): HIV Screen 4th Generation wRfx: NONREACTIVE

## 2019-04-07 ENCOUNTER — Other Ambulatory Visit: Payer: Self-pay | Admitting: Family Medicine

## 2019-04-29 ENCOUNTER — Other Ambulatory Visit: Payer: Self-pay | Admitting: Family Medicine

## 2019-04-29 DIAGNOSIS — E1165 Type 2 diabetes mellitus with hyperglycemia: Secondary | ICD-10-CM

## 2019-05-02 ENCOUNTER — Encounter: Payer: Self-pay | Admitting: Family Medicine

## 2019-05-08 ENCOUNTER — Other Ambulatory Visit: Payer: Self-pay | Admitting: Family Medicine

## 2019-05-08 DIAGNOSIS — J309 Allergic rhinitis, unspecified: Secondary | ICD-10-CM

## 2019-05-29 ENCOUNTER — Other Ambulatory Visit: Payer: Self-pay | Admitting: Family Medicine

## 2019-05-29 DIAGNOSIS — E1165 Type 2 diabetes mellitus with hyperglycemia: Secondary | ICD-10-CM

## 2019-06-28 NOTE — Progress Notes (Signed)
Chief Complaint  Patient presents with  . Diabetes    fasting med check.     Patient presents for 3 month follow-up on diabetes and other issues. At her last visit, diabetes wasn't well controlled, with A1c 8.3.  Diet hadn't been good, and she wasn't checking sugars at that time. She was started on Trulicity [id number], continued on Metformin at same dose.  She sent in sugars a month later which looked much better. She is tolerating medications without side effects. She also sent in more recent sugars earlier today (see MyChart attachments). Blood sugars have been running 83-150 (all but 2 values were under 122 in the morning).  She admits that with her cut in work hours, she is having trouble affording the $25 copay.  She took her last injection earlier this week. Hours are back up to normal, but still dealing with financial issues.  She read about concerns with Trulicity in people with certain cancers, and there is some thyroid cancer (and pancreatic) in her family. She does not know the type of thyroid cancer (ie if medullary or not).    She is now eating better, and walking 40 minutes daily She denies polydipsia, polyuria or hypoglycemia. She denies numbness/tingling/burning or sores in her feet. She has seen podiatry for plantar fasciitis and ingrown toenail. She hasn't had any recurrent heel pain with her current walking. Last diabetic eye exam was 11/2017, no retinopathy Lab Results  Component Value Date   HGBA1C 8.3 (A) 03/28/2019   ADHD: Doing well on Adderall. At her last visit she was only working 3 hours/day (driving bus, delivering lunches), and wasn't needing her medication. Previously, only needing to take 1 pill daily(Monday through Friday, rarely needs on the weekend),Last filled #60 01/20/2019. She is back to working 8 hours, 5 days/week since 05/09/2019.  She has been taking the Adderall once daily, during the week only. She is requesting a refill.  Hyperlipidemia:Sheis  compliant with taking Crestor without myalgias (previously didn't tolerate atorvastatin due to myalgias).She was at goal on last check.  Lab Results  Component Value Date   CHOL 133 03/28/2019   HDL 35 (L) 03/28/2019   LDLCALC 76 03/28/2019   TRIG 110 03/28/2019   CHOLHDL 3.8 03/28/2019    Hypertension--She reports compliance with her medications, denies side effects.She hasn't checked BP elsewhere.   Denies any headaches, chest pain, palpitations, edema.  Allergies: Has been using her Flonase and loraditine regularly, which has been effective. (uses the loratidine just prn, needing in the last week). claritin makes her sleepy, takes at night (helps her sleep).  History of anemia:She is s/p EGD and Colonoscopy in August, 2018. Pathology from stomach biopsy showed: ANTRAL MUCOSA WITH MILD CHANGES OF REACTIVE GASTROPATHY;BODY MUCOSA WITH SLIGHT CHRONIC INFLAMMATION.Colonoscopy--notable for internal hemorrhoids and diverticulosis. Gastroenterologist did not see a GI source for iron deficiency, suspectedrelated to menstrual losses.  She does have fibroids. [place] last year showed that fibroids were stable. She is under the care of Dr. Quincy Simmonds. She cancelled her GYN visit in April, hasn't yet rescheduled.  Last saw Dr. Quincy Simmonds 02/2018. Anemia had improved. Lab Results  Component Value Date   WBC 6.6 03/28/2019   HGB 11.9 03/28/2019   HCT 36.4 03/28/2019   MCV 82 03/28/2019   PLT 279 03/28/2019   Had a period 6/8, 7/30, 9/17, and started again 10/3.  The July cycle was very heavy.  Current cycle is light. Denies hot flashes or night sweats (just hot in the mornings, when  she is getting ready). Denies fatigue or dizziness. She takes iron 3x/week.  Left breast mass, noted at last visit in 08/2018.  She previously had biopsy in 2015 showing fibroadenoma. Imaging showed: IMPRESSION: Previously biopsied mass in the left breast consistent with fibroadenoma demonstrating interval  enlargement. Given the interval changes size, the option for surgical excision was discussed with the patient. She would like to proceed with that option at this time. RECOMMENDATION: Surgical consultation for excision of an enlarging, biopsy proven left breast fibroadenoma.  She had made appt with Dr. Ninfa Linden, was scheduled for lumpectomy, but it was cancelled by the patient (she was scared due to COVID to have elective surgery).  She denies any interim change. She is past due for bilateral mammogram.  Obesity:  She was started on Trulicity for DM at her last visit, and she also has been working hard on diet, and exercising regularly.  We had previously discussed Weight Watchers, medications such as Contrave and/or changing DM meds that would help with weight loss (which was done), and to consider surgical options if not successful with other measures.   She has lost 10# since her last visit, is exercising regularly and eating better.  Losing the weight has helped her left knee pain.  No pain with walking. No longer needs NSAIDs.   PMH, PSH, SH reviewed Family history was updated: Family History  Problem Relation Age of Onset  . Depression Mother   . Hypertension Mother   . Hyperlipidemia Mother   . Other Mother        colonic stricture  . Cancer Mother        uterine sarcoma  . Early death Father   . Heart disease Father 61  . Pancreatic cancer Maternal Grandmother   . Cancer Maternal Grandmother   . Thyroid cancer Maternal Aunt   . Breast cancer Maternal Aunt 64  . Cancer Maternal Aunt        breast and thyroid  . Thyroid cancer Other   . Breast cancer Maternal Aunt 64  . Cancer Maternal Aunt   . Diabetes Neg Hx   . Colon cancer Neg Hx      Outpatient Encounter Medications as of 06/29/2019  Medication Sig Note  . amphetamine-dextroamphetamine (ADDERALL) 20 MG tablet Take 1 tablet (20 mg total) by mouth See admin instructions. Take 1.5-2 tablets in the morning and 1  tablet in the evening, as directed   . aspirin 81 MG EC tablet Take 1 tablet (81 mg total) by mouth daily. Swallow whole, take with food   . Cholecalciferol (VITAMIN D) 2000 units tablet Take 2,000 Units by mouth daily.   . Ferrous Sulfate (IRON) 325 (65 FE) MG TABS Take 1 tablet by mouth daily.  09/13/2018: Takes it MWF  . losartan-hydrochlorothiazide (HYZAAR) 100-25 MG tablet Take 1 tablet by mouth daily.   . metFORMIN (GLUCOPHAGE-XR) 500 MG 24 hr tablet TAKE 2 TABLETS BY MOUTH EVERY DAY WITH BREAKFAST 06/29/2019: Takes with d[place]ner  . Multiple Vitamins-Minerals (MULTIVITAMIN WITH MINERALS) tablet Take 1 tablet by mouth daily. Reported on 01/02/2016   . ONETOUCH DELICA LANCETS 99991111 MISC Check Glucose once daily   . ONETOUCH VERIO test strip GLUCOSE CHECK ONCE DAILY   . rosuvastatin (CRESTOR) 5 MG tablet Take 1 tablet (5 mg total) by mouth daily.   . TRULICITY A999333 0000000 SOPN INJECT 0.75 MG INTO THE SKIN ONCE A WEEK.   . fluticasone (FLONASE) 50 MCG/ACT nasal spray SPRAY 2 SPRAYS INTO EACH NOSTRIL  EVERY DAY (Patient not taking: Reported on 06/29/2019)   . meloxicam (MOBIC) 15 MG tablet TAKE 1 TABLET BY MOUTH EVERY DAY (Patient not taking: Reported on 03/28/2019)    Facility-Administered Encounter Medications as of 06/29/2019  Medication  . 0.9 %  sodium chloride infusion   Allergies  Allergen Reactions  . Onglyza [Saxagliptin] Other (See Comments)    Eye irritation (when on [place]; tolerates metformin without problems)  Patient denies allergy to this product. States that it was another medication   ROS: no fever, chills, URI symptoms.  Allergies are barely starting. No cough, shortness of breath, chest pain, palpitations, edema, nausea, vomiting, bowel changes.  Denies easy bleeding, bruising, rashes. Irregular cycles per HPI.   PHYSICAL EXAM:  BP 130/80   Pulse 80   Temp (!) 97.1 F (36.2 C) (Other (Comment))   Ht 5\' 5"  (1.651 m)   Wt 270 lb 3.2 oz (122.6 kg)   BMI 44.96 kg/m    Wt Readings from Last 3 Encounters:  06/29/19 270 lb 3.2 oz (122.6 kg)  03/28/19 280 lb 3.2 oz (127.1 kg)  09/13/18 276 lb 9.6 oz (125.5 kg)   Pleasant, well-appearing obese female in no distress HEENT: conjunctiva and sclera are clear, EOMI. Wearing mask Neck: no lymphadenopathy, thyromegaly Heart: regular rate and rhythm, no murmur Lungs: clear bilaterally Back: no spinal or CVA tenderness Abdomen: soft, obese, nontender Extremities: no edema Skin: normal turgor, no visible rash Psych: normal mood, affect, hygiene and grooming Neuro: alert and oriented, normal gait   Lab Results  Component Value Date   HGBA1C 6.4 (A) 06/29/2019    ASSESSMENT/PLAN:  Diabetes mellitus without complication ([place]) - significantly improved since Trulia added; copay not affodable. Switch to [employer], may have $0 copay. If not, titrate up metformin instead - Plan: HgB A1c, Exenatide ER (BYDUREON BCISE) 2 MG/0.85ML AUIJ  Need for influenza vaccination - Plan: Flu Vaccine QUAD 6+ mos PF IM (Fluarix Quad PF)  Attention deficit disorder, unspecified hyperactivity presence - well controlled on current regimen - Plan: amphetamine-dextroamphetamine (ADDERALL) 20 MG tablet  Family history of thyroid cancer - discussed MEN syndromes, usually medullary thyroid cancer. Will check with family. Consider genetic counseling/testing. Cont meds for now  Irregular menses - perimenopausal. Increase iron to daily when on cycle. to R/S GYN visit.    F/u 3 mos med check, CPE 03/2020  Schedule yearly diabetic eye exam  Schedule bilateral mammogram Schedule routine GYN exam with Dr. Quincy Simmonds Consider reschedule with Dr. Ninfa Linden for excision of the fibroadenoma  Consider consult with genetics to do testing for MEN syndrome abnormalities Declines now due to cost.

## 2019-06-29 ENCOUNTER — Encounter: Payer: Self-pay | Admitting: Family Medicine

## 2019-06-29 ENCOUNTER — Ambulatory Visit: Payer: BC Managed Care – PPO | Admitting: Family Medicine

## 2019-06-29 ENCOUNTER — Other Ambulatory Visit: Payer: Self-pay

## 2019-06-29 VITALS — BP 130/80 | HR 80 | Temp 97.1°F | Ht 65.0 in | Wt 270.2 lb

## 2019-06-29 DIAGNOSIS — F988 Other specified behavioral and emotional disorders with onset usually occurring in childhood and adolescence: Secondary | ICD-10-CM

## 2019-06-29 DIAGNOSIS — Z808 Family history of malignant neoplasm of other organs or systems: Secondary | ICD-10-CM | POA: Diagnosis not present

## 2019-06-29 DIAGNOSIS — Z23 Encounter for immunization: Secondary | ICD-10-CM

## 2019-06-29 DIAGNOSIS — N926 Irregular menstruation, unspecified: Secondary | ICD-10-CM

## 2019-06-29 DIAGNOSIS — E119 Type 2 diabetes mellitus without complications: Secondary | ICD-10-CM

## 2019-06-29 LAB — POCT GLYCOSYLATED HEMOGLOBIN (HGB A1C): Hemoglobin A1C: 6.4 % — AB (ref 4.0–5.6)

## 2019-06-29 MED ORDER — BYDUREON BCISE 2 MG/0.85ML ~~LOC~~ AUIJ: 2.0000 mg | AUTO-INJECTOR | SUBCUTANEOUS | 5 refills | Status: DC

## 2019-06-29 MED ORDER — AMPHETAMINE-DEXTROAMPHETAMINE 20 MG PO TABS: 20.0000 mg | ORAL_TABLET | ORAL | 0 refills | Status: DC

## 2019-06-29 NOTE — Patient Instructions (Addendum)
If your aunt had medullary thyroid cancer, then I think we should consider sending you for genetic testing to rule out multiple endocrine neoplasia syndromes.  The medications are contraindicated if YOU have these cancers, not a family history, however if there is a strong family history, you could have the genetic mutation that puts you at risk for the syndrome.  If you test positive for the gene, we shouldn't keep you on this medications.  We are changing you from Trulicity to Bydureon B-cise simply to see if this makes it more cost effective. If it isn't, choices include staying on the Trulicity and we can help with samples if you have them, or to stop it and titrate up the metformin dose.  You would increase to 3 tablets, and if sugars remained above 130 fasting, consistently, then bump the dose up to the maximum of 4 metformin daily (all together, if tolerated, vs separating into twice daily with breakfast and dinner if it bothers your stomach).  Schedule yearly diabetic eye exam (if hasn't yet) Schedule bilateral mammogram Schedule routine GYN exam with Dr. Quincy Simmonds Consider reschedule with Dr. Ninfa Linden for excision of the fibroadenoma  Continue to track your periods. Take the iron daily during your cycle (and every other day the rest of the time).

## 2019-07-04 ENCOUNTER — Encounter: Payer: Self-pay | Admitting: Family Medicine

## 2019-07-05 NOTE — Telephone Encounter (Signed)
Please check with patient's pharmacy to see what the problem is--rx was sent 10/7 and says pharmacy confirmed receipt. She was given copay card from office to use (so shouldn't have to wait for prior auth, right?)  If there is an issue in her being able to pick this up today, please give her a sample of either the Bydureon B-cise (which we are switching her to), or the Trulicity that she had been taking (changing to lower cost/copay).  Thank you!

## 2019-08-02 ENCOUNTER — Other Ambulatory Visit: Payer: Self-pay | Admitting: Family Medicine

## 2019-08-02 DIAGNOSIS — N6320 Unspecified lump in the left breast, unspecified quadrant: Secondary | ICD-10-CM

## 2019-08-16 ENCOUNTER — Ambulatory Visit
Admission: RE | Admit: 2019-08-16 | Discharge: 2019-08-16 | Disposition: A | Discharge status: Home / Self Care | Payer: BC Managed Care – PPO | Source: Ambulatory Visit | Attending: Family Medicine | Admitting: Family Medicine

## 2019-08-16 ENCOUNTER — Other Ambulatory Visit: Payer: Self-pay

## 2019-08-16 DIAGNOSIS — N6320 Unspecified lump in the left breast, unspecified quadrant: Secondary | ICD-10-CM

## 2019-08-28 ENCOUNTER — Other Ambulatory Visit: Payer: Self-pay | Admitting: Family Medicine

## 2019-09-26 ENCOUNTER — Other Ambulatory Visit: Payer: Self-pay | Admitting: Family Medicine

## 2019-09-26 DIAGNOSIS — E1165 Type 2 diabetes mellitus with hyperglycemia: Secondary | ICD-10-CM

## 2019-09-27 NOTE — Telephone Encounter (Signed)
Pt was changed to b-cise

## 2019-10-05 NOTE — Progress Notes (Signed)
Chief Complaint  Patient presents with  . Diabetes    fasting med check, not taking any injectable insulin due to cost but did increase metformin to 3 tablets daily. Scheduled eye exam for March. Did not check with her aunt about thyroid cancer as she is still very sensitive and does not want to discuss.     Patient presents for 3 month follow-up on diabetes.  A1c improved from 8.3 to 6.4 after being started on Trulicity, in addition to metformin.  The Trulicity $00 copay was an issue (financial concerns due to cut in her work hours), so she was changed to Bydureon in hopes of getting a $0 copay.  That was not $0 copay, not affordable (told $100), so instead increased her metformin to 3/day.  She is tolerating this, notices sometimes a little gassiness, denies any diarrhea (actually reports some intermittent feeling like she doesn't empty completely).  She can now afford the copay for Trulicity and would like to restart it.  Blood sugars have sometimes 102-108, but also saw some 140's-158 (after she ate oreos) in the last 2 weeks. Also hasn't been walking as regularly. Sometimes sees 86-90's (if she didn't eat lunch, later in the day).   We had also discussed at last visit her concern about FH thyroid and pancreatic cancer in the family, with the risks of her medications if h/o multiple endocrine neoplasia. She was going to see if she could find out if the thyroid cancers were medullary or not, however was too sensitive an issue at this time to discuss with family.  She is continuing to eat better, and walking occasionally (less often since weather got colder and had to quarantine x 2 weeks). She does still dance around the house. She denies polydipsia, polyuria or hypoglycemia. She denies numbness/tingling/burning or sores in her feet. Last diabetic eye exam was 11/2017, no retinopathy. She has appt scheduled for the end of 11/2019  Lab Results  Component Value Date   HGBA1C 6.4 (A) 06/29/2019     ADHD: Doing well on Adderall.  She has been taking the Adderall once daily, during the week only.  Last filled #60 on 06/29/2019. It is effective and she denies side effects.  She needs a refill.  Hyperlipidemia:She tolerated Crestor without myalgias (previously didn't tolerate atorvastatin due to myalgias).She was at goal on last check. She admits that she stopped taking Crestor about a month ago, due to concerns about memory loss. She had read about it, and was concerned due to her mother's memory.  She hadn't really noticed any difference in her memory while taking it, no difference since off it; is willing to restart.  Lab Results  Component Value Date   CHOL 133 03/28/2019   HDL 35 (L) 03/28/2019   LDLCALC 76 03/28/2019   TRIG 110 03/28/2019   CHOLHDL 3.8 03/28/2019   Hypertension--She reports compliance with her medications, denies side effects.She hasn't checked BP elsewhere.   Denies any headaches, dizziness, chest pain, palpitations, edema. BP Readings from Last 3 Encounters:  06/29/19 130/80  03/28/19 138/84  09/13/18 138/90   Allergies: Not currently needing treatment. Flonaseand loraditine are effective seasonally, when needed.  History of anemia:She is s/p EGD and Colonoscopy in August, 2018 (reactive gastropathy, inflammation in stomach, internal hemorrhoids and diverticulosis on colonoscopy). Gastroenterologist did not see a GI source for iron deficiency, suspectedrelated to menstrual losses.  She does have fibroids. She is under the care of Dr. Quincy Simmonds, and has appointment scheduled for next week.  Anemia had improved on last check. She takes iron 3x/week. Last week she had a heavy cycle, so took it every day. Denies fatigue or dizziness, denies hot flashes or night sweats. Still having cycles, no more irregular than at prior visit.  She had light bleeding in November, but very heavy period earlier this month.   Lab Results  Component Value Date   WBC 6.6  03/28/2019   HGB 11.9 03/28/2019   HCT 36.4 03/28/2019   MCV 82 03/28/2019   PLT 279 03/28/2019   Left breast mass:She previously had biopsy in 2015 showing fibroadenoma. Imaging in 2019 showed interval enlargement, and recommendation was for surgical consultation for excision.  She cancelled appt related to COVID concerns.  She had mammogram repeated 07/2019 (and ultrasound).  IMPRESSION: 1. Stable 1.6 cm mass within the UPPER LEFT breast, biopsy proven to be a fibroadenoma but had increased in size on 09/21/2018. One additional follow-up in 1 year is recommended to ensure 2 year stability after size increase.  2. No new or suspicious mammographic findings within either breast. RECOMMENDATION: Bilateral diagnostic mammogram with LEFT breast ultrasound in 1 year.  Obesity:  She was started on injectables (Trulicity) in order to get better control of DM as well as aiding in weight loss.  She has been watching her diet and exercising more regularly. She had lost 10# at her last visit since adding Trulcity. She is no longer on these medications, but has maintained her weight loss. She hopes to restart Trulicity today.   PMH, PSH, SH reviewed  Outpatient Encounter Medications as of 10/06/2019  Medication Sig Note  . amphetamine-dextroamphetamine (ADDERALL) 20 MG tablet Take 1 tablet (20 mg total) by mouth See admin instructions. Take 1.5-2 tablets in the morning and 1 tablet in the evening, as directed   . aspirin 81 MG EC tablet Take 1 tablet (81 mg total) by mouth daily. Swallow whole, take with food   . Cholecalciferol (VITAMIN D) 2000 units tablet Take 2,000 Units by mouth daily.   . Ferrous Sulfate (IRON) 325 (65 FE) MG TABS Take 1 tablet by mouth daily.  09/13/2018: Takes it MWF  . losartan-hydrochlorothiazide (HYZAAR) 100-25 MG tablet Take 1 tablet by mouth daily.   . metFORMIN (GLUCOPHAGE-XR) 500 MG 24 hr tablet TAKE 2 TABLETS BY MOUTH EVERY DAY WITH BREAKFAST (Patient taking  differently: Take 1,500 mg by mouth daily. TAKE 2 TABLETS BY MOUTH EVERY DAY WITH BREAKFAST)   . Multiple Vitamins-Minerals (MULTIVITAMIN WITH MINERALS) tablet Take 1 tablet by mouth daily. Reported on 01/02/2016   . OneTouch Delica Lancets 84Z MISC CHECK GLUCOSE ONCE DAILY   . ONETOUCH VERIO test strip GLUCOSE CHECK ONCE DAILY   . fluticasone (FLONASE) 50 MCG/ACT nasal spray SPRAY 2 SPRAYS INTO EACH NOSTRIL EVERY DAY (Patient not taking: Reported on 06/29/2019)   . meloxicam (MOBIC) 15 MG tablet TAKE 1 TABLET BY MOUTH EVERY DAY (Patient not taking: Reported on 03/28/2019)   . rosuvastatin (CRESTOR) 5 MG tablet Take 1 tablet (5 mg total) by mouth daily. (Patient not taking: Reported on 10/06/2019) 10/06/2019: Been off about about a month  . [DISCONTINUED] Exenatide ER (BYDUREON BCISE) 2 MG/0.85ML AUIJ Inject 2 mg into the skin once a week. (Patient not taking: Reported on 10/06/2019)    Facility-Administered Encounter Medications as of 10/06/2019  Medication  . 0.9 %  sodium chloride infusion   Allergies  Allergen Reactions  . Onglyza [Saxagliptin] Other (See Comments)    Eye irritation (when on [place]; tolerates  metformin without problems)  Patient denies allergy to this product. States that it was another medication    ROS:  No fever, chills, headaches, dizziness, chest pain, shortness of breath, URI symptoms.  Denies nausea, vomiting, bowel changes (slight constipation/trouble emptying). Denies bleeding, bruising, rash. Menses-- irregular, heavy last cycle. No hot flashes or night sweats Moods are good.   PHYSICAL EXAM:  BP 130/80   Pulse 80   Temp 97.7 F (36.5 C) (Other (Comment))   Ht 5' 5"  (1.651 m)   Wt 268 lb 6.4 oz (121.7 kg)   BMI 44.66 kg/m   Wt Readings from Last 3 Encounters:  10/06/19 268 lb 6.4 oz (121.7 kg)  06/29/19 270 lb 3.2 oz (122.6 kg)  03/28/19 280 lb 3.2 oz (127.1 kg)   Pleasant, well-appearing female in no distress HEENT: conjunctiva and sclera are  clear, EOMI. Wearing mask Neck: no lymphadenopathy, thyromegaly Heart: regular rate and rhythm, no murmur Lungs: clear bilaterally Back: no spinal or CVA tenderness Abdomen: soft, obese, nontender Extremities: no edema Skin: normal turgor, no visible rash Psych: normal mood, affect, hygiene and grooming Neuro: alert and oriented, normal gait  Lab Results  Component Value Date   HGBA1C 6.2 (A) 10/06/2019    ASSESSMENT/PLAN:  Diabetes mellitus without complication ([facility]) - well controlled on metformin 1568m; will change back to 13568SHand resume Trulicity (should help with wt loss) - Plan: HgB A1c, Comprehensive metabolic panel  Essential hypertension, benign - well controlled - Plan: Comprehensive metabolic panel, losartan-hydrochlorothiazide (HYZAAR) 100-25 MG tablet  Dyslipidemia - at goal on Crestor per last check; She agrees to restart Crestor - Plan: rosuvastatin (CRESTOR) 5 MG tablet  Iron deficiency anemia, unspecified iron deficiency anemia type - Plan: CBC with Differential  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 Encouraged to periodically monitor - Plan: Comprehensive metabolic panel, losartan-hydrochlorothiazide (HYZAAR) 100-25 MG tablet  Controlled type 2 diabetes mellitus without complication, without long-term current use of insulin ([facility]) - A1c up slightly--counseled re: diet, exercise, portions, wt loss.  Declines increasing metformin dose or returning for 3 month f/u - Plan: Dulaglutide (TRULICITY) 06.83MFG/9.0SXSOPN, metFORMIN (GLUCOPHAGE-XR) 500 MG 24 hr tablet, rosuvastatin (CRESTOR) 5 MG tablet  Attention deficit disorder, unspecified hyperactivity presence - well controlled on current regimen - Plan:  amphetamine-dextroamphetamine (ADDERALL) 20 MG tablet, amphetamine-dextroamphetamine (ADDERALL) 20 MG tablet, amphetamine-dextroamphetamine (ADDERALL) 20 MG tablet  Class 3 severe obesity due to excess calories with serious comorbidity and body mass index (BMI) of 40.0 to 44.9 in adult ([facility] - Encouraged to get back on regimen of regular exercise, healthy diet, and continue wt loss.   A1c, c-met, CBC Add ferritin only if anemic Send labs to Dr. SQuincy Simmonds F/u as scheduled for CPE 03/2020  visit 35 min face to face, plus at least 10 mins reviewing prior records (labs, mammo) and documentation.

## 2019-10-06 ENCOUNTER — Encounter: Payer: Self-pay | Admitting: Family Medicine

## 2019-10-06 ENCOUNTER — Ambulatory Visit: Payer: BC Managed Care – PPO | Admitting: Family Medicine

## 2019-10-06 ENCOUNTER — Other Ambulatory Visit: Payer: Self-pay

## 2019-10-06 VITALS — BP 130/80 | HR 80 | Temp 97.7°F | Ht 65.0 in | Wt 268.4 lb

## 2019-10-06 DIAGNOSIS — D242 Benign neoplasm of left breast: Secondary | ICD-10-CM

## 2019-10-06 DIAGNOSIS — E785 Hyperlipidemia, unspecified: Secondary | ICD-10-CM | POA: Diagnosis not present

## 2019-10-06 DIAGNOSIS — Z5181 Encounter for therapeutic drug level monitoring: Secondary | ICD-10-CM

## 2019-10-06 DIAGNOSIS — D509 Iron deficiency anemia, unspecified: Secondary | ICD-10-CM | POA: Diagnosis not present

## 2019-10-06 DIAGNOSIS — F988 Other specified behavioral and emotional disorders with onset usually occurring in childhood and adolescence: Secondary | ICD-10-CM

## 2019-10-06 DIAGNOSIS — E119 Type 2 diabetes mellitus without complications: Secondary | ICD-10-CM | POA: Diagnosis not present

## 2019-10-06 DIAGNOSIS — Z6841 Body Mass Index (BMI) 40.0 and over, adult: Secondary | ICD-10-CM

## 2019-10-06 DIAGNOSIS — I10 Essential (primary) hypertension: Secondary | ICD-10-CM | POA: Diagnosis not present

## 2019-10-06 LAB — CBC WITH DIFFERENTIAL/PLATELET
Basophils Absolute: 0 10*3/uL (ref 0.0–0.2)
Basos: 0 %
EOS (ABSOLUTE): 0.2 10*3/uL (ref 0.0–0.4)
Eos: 2 %
Hematocrit: 33.6 % — ABNORMAL LOW (ref 34.0–46.6)
Hemoglobin: 11 g/dL — ABNORMAL LOW (ref 11.1–15.9)
Immature Grans (Abs): 0 10*3/uL (ref 0.0–0.1)
Immature Granulocytes: 0 %
Lymphocytes Absolute: 1.9 10*3/uL (ref 0.7–3.1)
Lymphs: 27 %
MCH: 27.2 pg (ref 26.6–33.0)
MCHC: 32.7 g/dL (ref 31.5–35.7)
MCV: 83 fL (ref 79–97)
Monocytes Absolute: 0.3 10*3/uL (ref 0.1–0.9)
Monocytes: 5 %
Neutrophils Absolute: 4.6 10*3/uL (ref 1.4–7.0)
Neutrophils: 66 %
Platelets: 351 10*3/uL (ref 150–450)
RBC: 4.04 x10E6/uL (ref 3.77–5.28)
RDW: 15.4 % (ref 11.7–15.4)
WBC: 7.1 10*3/uL (ref 3.4–10.8)

## 2019-10-06 LAB — COMPREHENSIVE METABOLIC PANEL
ALT: 12 IU/L (ref 0–32)
AST: 14 IU/L (ref 0–40)
Albumin/Globulin Ratio: 1.5 (ref 1.2–2.2)
Albumin: 4 g/dL (ref 3.8–4.9)
Alkaline Phosphatase: 70 IU/L (ref 39–117)
BUN/Creatinine Ratio: 13 (ref 9–23)
BUN: 11 mg/dL (ref 6–24)
Bilirubin Total: 0.3 mg/dL (ref 0.0–1.2)
CO2: 23 mmol/L (ref 20–29)
Calcium: 9.7 mg/dL (ref 8.7–10.2)
Chloride: 100 mmol/L (ref 96–106)
Creatinine, Ser: 0.88 mg/dL (ref 0.57–1.00)
GFR calc Af Amer: 88 mL/min/{1.73_m2} (ref >59)
GFR calc non Af Amer: 76 mL/min/{1.73_m2} (ref >59)
Globulin, Total: 2.7 g/dL (ref 1.5–4.5)
Glucose: 119 mg/dL — ABNORMAL HIGH (ref 65–99)
Potassium: 4.1 mmol/L (ref 3.5–5.2)
Sodium: 137 mmol/L (ref 134–144)
Total Protein: 6.7 g/dL (ref 6.0–8.5)

## 2019-10-06 LAB — POCT GLYCOSYLATED HEMOGLOBIN (HGB A1C): Hemoglobin A1C: 6.2 % — AB (ref 4.0–5.6)

## 2019-10-06 MED ORDER — ROSUVASTATIN CALCIUM 5 MG PO TABS: 5.0000 mg | ORAL_TABLET | Freq: Every day | ORAL | 1 refills | Status: DC

## 2019-10-06 MED ORDER — AMPHETAMINE-DEXTROAMPHETAMINE 20 MG PO TABS: 20.0000 mg | ORAL_TABLET | Freq: Every day | ORAL | 0 refills | Status: DC

## 2019-10-06 MED ORDER — TRULICITY 0.75 MG/0.5ML ~~LOC~~ SOAJ: 0.7500 mg | SUBCUTANEOUS | 5 refills | Status: DC

## 2019-10-06 MED ORDER — LOSARTAN POTASSIUM-HCTZ 100-25 MG PO TABS: 1.0000 | ORAL_TABLET | Freq: Every day | ORAL | 1 refills | Status: DC

## 2019-10-06 MED ORDER — METFORMIN HCL ER 500 MG PO TB24: ORAL_TABLET | ORAL | 1 refills | Status: DC

## 2019-10-06 NOTE — Patient Instructions (Signed)
Restart your rosuvastatin (Crestor) daily. Restart the Trulicity once weekly, and reduce the metformin back to 2 tablets daily.  Try and get back into a regular exercise routine.  Keep up the good work!!!

## 2019-10-07 ENCOUNTER — Other Ambulatory Visit: Payer: Self-pay

## 2019-10-10 LAB — SPECIMEN STATUS REPORT

## 2019-10-10 LAB — FERRITIN: Ferritin: 24 ng/mL (ref 15–150)
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
## 2019-10-11 ENCOUNTER — Encounter: Payer: Self-pay | Admitting: Obstetrics and Gynecology

## 2019-10-11 ENCOUNTER — Other Ambulatory Visit (HOSPITAL_COMMUNITY)
Admission: RE | Admit: 2019-10-11 | Discharge: 2019-10-11 | Disposition: A | Discharge status: Home / Self Care | Payer: BC Managed Care – PPO | Source: Ambulatory Visit | Attending: Obstetrics and Gynecology | Admitting: Obstetrics and Gynecology

## 2019-10-11 ENCOUNTER — Ambulatory Visit: Payer: BC Managed Care – PPO | Admitting: Obstetrics and Gynecology

## 2019-10-11 ENCOUNTER — Other Ambulatory Visit: Payer: Self-pay

## 2019-10-11 ENCOUNTER — Telehealth: Payer: Self-pay | Admitting: Obstetrics and Gynecology

## 2019-10-11 VITALS — BP 124/82 | HR 76 | Temp 94.4°F | Resp 18 | Ht 65.0 in | Wt 269.0 lb

## 2019-10-11 DIAGNOSIS — Z01419 Encounter for gynecological examination (general) (routine) without abnormal findings: Secondary | ICD-10-CM

## 2019-10-11 DIAGNOSIS — Z113 Encounter for screening for infections with a predominantly sexual mode of transmission: Secondary | ICD-10-CM | POA: Diagnosis not present

## 2019-10-11 DIAGNOSIS — N841 Polyp of cervix uteri: Secondary | ICD-10-CM | POA: Insufficient documentation

## 2019-10-11 NOTE — Telephone Encounter (Signed)
Patient placed in MMG recall.   Routing to Dr. Antony Blackbird.   Encounter closed.

## 2019-10-11 NOTE — Patient Instructions (Signed)

## 2019-10-11 NOTE — Telephone Encounter (Signed)
Please place in mammogram recall.  Patient followed for a left breast fibroadenoma.  She is due for bilateral dx mammogram and left breast US in Nov. 2021.

## 2019-10-12 LAB — CERVICOVAGINAL ANCILLARY ONLY
Chlamydia: NEGATIVE
Comment: NEGATIVE
Comment: NEGATIVE
Comment: NORMAL
Neisseria Gonorrhea: NEGATIVE
Trichomonas: NEGATIVE

## 2019-10-12 LAB — RPR: RPR Ser Ql: NONREACTIVE

## 2019-10-12 LAB — HEPATITIS C ANTIBODY: Hep C Virus Ab: <0.1 s/co ratio (ref 0.0–0.9)

## 2019-10-12 LAB — HEPATITIS B SURFACE ANTIGEN: Hepatitis B Surface Ag: NEGATIVE

## 2019-10-13 LAB — SURGICAL PATHOLOGY

## 2019-10-25 ENCOUNTER — Ambulatory Visit: Payer: BC Managed Care – PPO | Admitting: Obstetrics and Gynecology

## 2019-11-07 ENCOUNTER — Other Ambulatory Visit: Payer: Self-pay

## 2019-11-07 ENCOUNTER — Ambulatory Visit: Payer: BC Managed Care – PPO | Admitting: Podiatry

## 2019-11-07 ENCOUNTER — Encounter: Payer: Self-pay | Admitting: Podiatry

## 2019-11-07 DIAGNOSIS — L600 Ingrowing nail: Secondary | ICD-10-CM

## 2019-11-07 DIAGNOSIS — M722 Plantar fascial fibromatosis: Secondary | ICD-10-CM | POA: Diagnosis not present

## 2019-11-07 MED ORDER — MELOXICAM 15 MG PO TABS: 15.0000 mg | ORAL_TABLET | Freq: Every day | ORAL | 0 refills | Status: DC

## 2019-11-07 MED ORDER — CEPHALEXIN 500 MG PO CAPS: 500.0000 mg | ORAL_CAPSULE | Freq: Three times a day (TID) | ORAL | 0 refills | Status: DC

## 2019-11-07 NOTE — Patient Instructions (Signed)

## 2019-11-07 NOTE — Progress Notes (Signed)
Subjective: 54 year old female presents the office today for concerns of left ingrown toenail, possible infection to the right big toe, lateral aspect on the last 1 week.  She previously did notice some amount of pus coming from the area.  Some minimal swelling but no redness or red streaks.  Area tender with pressure shoes.  She is also missed her new job and she wants to discuss orthotics.Denies any systemic complaints such as fevers, chills, nausea, vomiting. No acute changes since last appointment, and no other complaints at this time.   Objective: AAO x3, NAD DP/PT pulses palpable bilaterally, CRT less than 3 seconds Incurvation present to the lateral aspect of the right posterior base of the nail small granulation tissues present.  Not able to identify any drainage or pus today.  There is localized edema there is no significant erythema warmth.  No ascending cellulitis.   Currently no pain no reports or insertion of infection.  No pain to the heel.  No pain Achilles tendon. No pain with calf compression, swelling, warmth, erythema  Assessment: Bilateral hallux symptomatic ingrown toenail, history of chronic fasciitis  Plan: -All treatment options discussed with the patient including all alternatives, risks, complications.  -In regards to the orthotics we discussed both custom as well as over-the-counter.  She can start an over-the-counter insert discussed what to look for.  Encourage supportive shoes and continue stretching, rehab. Mobic prn.  -At this time, the patient is requesting partial nail removal with chemical matricectomy to the symptomatic portion of the nail. Risks and complications were discussed with the patient for which they understand and written consent was obtained. Under sterile conditions a total of 3 mL of a mixture of 2% lidocaine plain and 0.5% Marcaine plain was infiltrated in a hallux block fashion. Once anesthetized, the skin was prepped in sterile fashion. A  tourniquet was then applied. Next the lateral aspect of hallux nail border was then sharply excised making sure to remove the entire offending nail border. Once the nails were ensured to be removed area was debrided and the underlying skin was intact. There is no purulence identified in the procedure. Next phenol was then applied under standard conditions and copiously irrigated. Silvadene was applied. A dry sterile dressing was applied. After application of the dressing the tourniquet was removed and there is found to be an immediate capillary refill time to the digit. The patient tolerated the procedure well any complications. Post procedure instructions were discussed the patient for which he verbally understood. Follow-up in one week for nail check or sooner if any problems are to arise. Discussed signs/symptoms of infection and directed to call the office immediately should any occur or go directly to the emergency room. In the meantime, encouraged to call the office with any questions, concerns, changes symptoms. -Keflex -Patient encouraged to call the office with any questions, concerns, change in symptoms.   Return for nail check in 1-2 weeks.  Trula Slade DPM

## 2019-11-22 ENCOUNTER — Ambulatory Visit (INDEPENDENT_AMBULATORY_CARE_PROVIDER_SITE_OTHER): Payer: BC Managed Care – PPO

## 2019-11-22 ENCOUNTER — Encounter: Payer: Self-pay | Admitting: Podiatry

## 2019-11-22 ENCOUNTER — Ambulatory Visit (INDEPENDENT_AMBULATORY_CARE_PROVIDER_SITE_OTHER): Payer: BC Managed Care – PPO | Admitting: Podiatry

## 2019-11-22 ENCOUNTER — Other Ambulatory Visit: Payer: Self-pay

## 2019-11-22 DIAGNOSIS — M7750 Other enthesopathy of unspecified foot: Secondary | ICD-10-CM

## 2019-11-22 DIAGNOSIS — M79672 Pain in left foot: Secondary | ICD-10-CM

## 2019-11-22 DIAGNOSIS — M7752 Other enthesopathy of left foot: Secondary | ICD-10-CM | POA: Diagnosis not present

## 2019-11-22 NOTE — Patient Instructions (Signed)
Peroneal Tendinopathy Rehab Ask your health care provider which exercises are safe for you. Do exercises exactly as told by your health care provider and adjust them as directed. It is normal to feel mild stretching, pulling, tightness, or discomfort as you do these exercises. Stop right away if you feel sudden pain or your pain gets worse. Do not begin these exercises until told by your health care provider. Stretching and range-of-motion exercises These exercises warm up your muscles and joints and improve the movement and flexibility of your ankle. These exercises also help to relieve pain and stiffness. Gastroc and soleus stretch, standing This is an exercise in which you stand on a step and use your body weight to stretch your calf muscles. To do this exercise: 1. Stand on the edge of a step on the ball of your left / right foot. The ball of your foot is on the walking surface, right under your toes. 2. Keep your other foot firmly on the same step. 3. Hold on to the wall, a railing, or a chair for balance. 4. Slowly lift your other foot, allowing your body weight to press your left / right heel down over the edge of the step. You should feel a stretch in your left / right calf (gastrocnemius and soleus). 5. Hold this position for __________ seconds. 6. Return both feet to the step. 7. Repeat this exercise with a slight bend in your left / right knee. Repeat __________ times with your left / right knee straight and __________ times with your left / right knee bent. Complete this exercise __________ times a day. Strengthening exercises These exercises build strength and endurance in your foot and ankle. Endurance is the ability to use your muscles for a long time, even after they get tired. Ankle dorsiflexion with band  1. Secure a rubber exercise band or tube to an object, such as a table leg, that will not move when the band is pulled. 2. Secure the other end of the band around your left /  right foot. 3. Sit on the floor, facing the object with your left / right leg extended. The band or tube should be slightly tense when your foot is relaxed. 4. Slowly flex your left / right ankle and toes to bring your foot toward you (dorsiflexion). 5. Hold this position for __________ seconds. 6. Let the band or tube slowly pull your foot back to the starting position. Repeat __________ times. Complete this exercise __________ times a day. Ankle eversion 1. Sit on the floor with your legs straight out in front of you. 2. Loop a rubber exercise band or tube around the ball of your left / right foot. The ball of your foot is on the walking surface, right under your toes. 3. Hold the ends of the band in your hands, or secure the band to a stable object. The band or tube should be slightly tense when your foot is relaxed. 4. Slowly push your foot outward, away from your other leg (eversion). 5. Hold this position for __________ seconds. 6. Slowly return your foot to the starting position. Repeat __________ times. Complete this exercise __________ times a day. Plantar flexion, standing This exercise is sometimes called standing heel raise. 1. Stand with your feet shoulder-width apart. 2. Place your hands on a wall or table to steady yourself as needed, but try not to use it for support. 3. Keep your weight spread evenly over the width of your feet while you slowly  rise up on your toes (plantar flexion). If told by your health care provider: ? Shift your weight toward your left / right leg until you feel challenged. ? Stand on your left / right leg only. 4. Hold this position for __________ seconds. Repeat __________ times. Complete this exercise __________ times a day. Single leg stand 1. Without shoes, stand near a railing or in a doorway. You may hold on to the railing or door frame as needed. 2. Stand on your left / right foot. Keep your big toe down on the floor and try to keep your arch  lifted. ? Do not roll to the outside of your foot. ? If this exercise is too easy, you can try it with your eyes closed or while standing on a pillow. 3. Hold this position for __________ seconds. Repeat __________ times. Complete this exercise __________ times a day. This information is not intended to replace advice given to you by your health care provider. Make sure you discuss any questions you have with your health care provider. Document Revised: 12/28/2018 Document Reviewed: 12/28/2018 Elsevier Patient Education  [place].

## 2019-11-23 NOTE — Progress Notes (Signed)
Subjective: 54 year old female presents the office today for follow-up evaluation for left ingrown toenail procedures performed.  She states that she is doing well and she is having no pain to the area no redness or drainage.  No concerns.  She also states that she has new concerns of pain to her left foot and she feels a knot on the side of her foot as well as some mild swelling.  She has noticed some increased tenderness over the last week.  She is tried Aleve and massage as well as soaking.  She denies any recent injury. Denies any systemic complaints such as fevers, chills, nausea, vomiting. No acute changes since last appointment, and no other complaints at this time.   Objective: AAO x3, NAD DP/PT pulses palpable bilaterally, CRT less than 3 seconds Status post ingrown toenail on the right side.  Procedure site is well-healed there is no edema, erythema or signs of infection. Mild tenderness palpation of the left fifth metatarsal base on the distal portion of the peroneal tendon along the insertion of the fifth metatarsal base.  Minimal edema.  Is no erythema or warmth. No open lesions or pre-ulcerative lesions.  No pain with calf compression, swelling, warmth, erythema  Assessment: Insertional tendinitis left fifth metatarsal base, follow-up for right hallux ingrown toenail which is healing well  Plan: -All treatment options discussed with the patient including all alternatives, risks, complications.  -Procedure site she is doing well.  I want her to continue to wash with soap and water.  Apply Band-Aid during the day but leave the area open at night.  Follow-up as needed -Regards the left foot pain x-rays were obtained and reviewed.  No evidence of acute fracture but likely old small spur fifth metatarsal base.  We discussed continue icing to the area as well as meloxicam as needed.  Continue supportive shoes and inserts.  We discussed steroid injection but we held off on this  today. -Patient encouraged to call the office with any questions, concerns, change in symptoms.   Return for left foot pain in 4-6 weeks.  Trula Slade DPM

## 2019-12-20 ENCOUNTER — Ambulatory Visit: Payer: BC Managed Care – PPO | Admitting: Podiatry

## 2019-12-20 LAB — HM DIABETES EYE EXAM

## 2019-12-26 ENCOUNTER — Encounter: Payer: Self-pay | Admitting: *Deleted

## 2020-01-10 ENCOUNTER — Other Ambulatory Visit: Payer: Self-pay | Admitting: Family Medicine

## 2020-01-10 DIAGNOSIS — F988 Other specified behavioral and emotional disorders with onset usually occurring in childhood and adolescence: Secondary | ICD-10-CM

## 2020-01-10 MED ORDER — AMPHETAMINE-DEXTROAMPHETAMINE 20 MG PO TABS: 20.0000 mg | ORAL_TABLET | Freq: Every day | ORAL | 0 refills | Status: DC

## 2020-01-10 NOTE — Telephone Encounter (Signed)
[facility] is requesting to fill pt adderall. Please advise [facility]

## 2020-01-30 ENCOUNTER — Ambulatory Visit: Payer: BC Managed Care – PPO | Admitting: Family Medicine

## 2020-01-30 ENCOUNTER — Encounter: Payer: Self-pay | Admitting: Family Medicine

## 2020-01-30 ENCOUNTER — Other Ambulatory Visit: Payer: Self-pay

## 2020-01-30 VITALS — BP 130/80 | HR 67 | Temp 97.3°F | Wt 251.6 lb

## 2020-01-30 DIAGNOSIS — I10 Essential (primary) hypertension: Secondary | ICD-10-CM

## 2020-01-30 DIAGNOSIS — G5601 Carpal tunnel syndrome, right upper limb: Secondary | ICD-10-CM | POA: Diagnosis not present

## 2020-01-30 DIAGNOSIS — Z6841 Body Mass Index (BMI) 40.0 and over, adult: Secondary | ICD-10-CM

## 2020-01-30 DIAGNOSIS — E119 Type 2 diabetes mellitus without complications: Secondary | ICD-10-CM | POA: Diagnosis not present

## 2020-01-30 MED ORDER — MELOXICAM 15 MG PO TABS: 15.0000 mg | ORAL_TABLET | Freq: Every day | ORAL | 0 refills | Status: AC
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
## 2020-01-30 NOTE — Patient Instructions (Addendum)
Take the meloxicam once daily with food for up to 14 days (can stop after 10 if no longer having any tingling). Wear the wrist brace (carpal tunnel brace, get from pharmacy) while driving and sleeping, since your sleep position flares the numbness). Continue to wear the brace driving for a month, if able to, and then you can try cutting back on wearing that.  This is likely caused or at least exacerbated by your driving  If you are having ongoing problems, you want to consider having it evaluated/treated through worker's comp.   Carpal Tunnel Syndrome  Carpal tunnel syndrome is a condition that causes pain in your hand and arm. The carpal tunnel is a narrow area located on the palm side of your wrist. Repeated wrist motion or certain diseases may cause swelling within the tunnel. This swelling pinches the main nerve in the wrist (median nerve). What are the causes? This condition may be caused by:  Repeated wrist motions.  Wrist injuries.  Arthritis.  A cyst or tumor in the carpal tunnel.  Fluid buildup during pregnancy. Sometimes the cause of this condition is not known. What increases the risk? The following factors may make you more likely to develop this condition:  Having a job, such as being a Research scientist (life sciences), that requires you to repeatedly move your wrist in the same motion.  Being a woman.  Having certain conditions, such as: ? Diabetes. ? Obesity. ? An underactive thyroid (hypothyroidism). ? Kidney failure. What are the signs or symptoms? Symptoms of this condition include:  A tingling feeling in your fingers, especially in your thumb, index, and middle fingers.  Tingling or numbness in your hand.  An aching feeling in your entire arm, especially when your wrist and elbow are bent for a long time.  Wrist pain that goes up your arm to your shoulder.  Pain that goes down into your palm or fingers.  A weak feeling in your hands. You may have trouble  grabbing and holding items. Your symptoms may feel worse during the night. How is this diagnosed? This condition is diagnosed with a medical history and physical exam. You may also have tests, including:  Electromyogram (EMG). This test measures electrical signals sent by your nerves into the muscles.  Nerve conduction study. This test measures how well electrical signals pass through your nerves.  Imaging tests, such as X-rays, ultrasound, and MRI. These tests check for possible causes of your condition. How is this treated? This condition may be treated with:  Lifestyle changes. It is important to stop or change the activity that caused your condition.  Doing exercise and activities to strengthen your muscles and bones (physical therapy).  Learning how to use your hand again after diagnosis (occupational therapy).  Medicines for pain and inflammation. This may include medicine that is injected into your wrist.  A wrist splint.  Surgery. Follow these instructions at home: If you have a splint:  Wear the splint as told by your health care provider. Remove it only as told by your health care provider.  Loosen the splint if your fingers tingle, become numb, or turn cold and blue.  Keep the splint clean.  If the splint is not waterproof: ? Do not let it get wet. ? Cover it with a watertight covering when you take a bath or shower. Managing pain, stiffness, and swelling   If directed, put ice on the painful area: ? If you have a removable splint, remove it as  told by your health care provider. ? Put ice in a plastic bag. ? Place a towel between your skin and the bag. ? Leave the ice on for 20 minutes, 2-3 times per day. General instructions  Take over-the-counter and prescription medicines only as told by your health care provider.  Rest your wrist from any activity that may be causing your pain. If your condition is work related, talk with your employer about changes that  can be made, such as getting a wrist pad to use while typing.  Do any exercises as told by your health care provider, physical therapist, or occupational therapist.  Keep all follow-up visits as told by your health care provider. This is important. Contact a health care provider if:  You have new symptoms.  Your pain is not controlled with medicines.  Your symptoms get worse. Get help right away if:  You have severe numbness or tingling in your wrist or hand. Summary  Carpal tunnel syndrome is a condition that causes pain in your hand and arm.  It is usually caused by repeated wrist motions.  Lifestyle changes and medicines are used to treat carpal tunnel syndrome. Surgery may be recommended.  Follow your health care provider's instructions about wearing a splint, resting from activity, keeping follow-up visits, and calling for help. This information is not intended to replace advice given to you by your health care provider. Make sure you discuss any questions you have with your health care provider. Document Revised: 01/15/2018 Document Reviewed: 01/15/2018 Elsevier Patient Education  [place].

## 2020-02-24 ENCOUNTER — Other Ambulatory Visit: Payer: Self-pay | Admitting: Family Medicine

## 2020-02-24 DIAGNOSIS — G5601 Carpal tunnel syndrome, right upper limb: Secondary | ICD-10-CM

## 2020-02-24 NOTE — Telephone Encounter (Signed)
Pt does not need this medication

## 2020-03-12 ENCOUNTER — Ambulatory Visit (HOSPITAL_COMMUNITY)
Admission: EM | Admit: 2020-03-12 | Discharge: 2020-03-12 | Disposition: A | Discharge status: Home / Self Care | Payer: Worker's Compensation | Attending: Family Medicine | Admitting: Family Medicine

## 2020-03-12 ENCOUNTER — Other Ambulatory Visit: Payer: Self-pay

## 2020-03-12 ENCOUNTER — Encounter (HOSPITAL_COMMUNITY): Payer: Self-pay

## 2020-03-12 DIAGNOSIS — M25551 Pain in right hip: Secondary | ICD-10-CM

## 2020-03-12 DIAGNOSIS — M545 Low back pain, unspecified: Secondary | ICD-10-CM

## 2020-03-12 DIAGNOSIS — W19XXXA Unspecified fall, initial encounter: Secondary | ICD-10-CM

## 2020-03-12 DIAGNOSIS — M25511 Pain in right shoulder: Secondary | ICD-10-CM

## 2020-03-12 MED ORDER — CYCLOBENZAPRINE HCL 10 MG PO TABS: 10.0000 mg | ORAL_TABLET | Freq: Two times a day (BID) | ORAL | 0 refills | Status: DC | PRN

## 2020-03-12 NOTE — ED Triage Notes (Signed)
Patient reports she fell today at work. Employer saw the video and told patient she needed to come get checked out.
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
## 2020-03-12 NOTE — Discharge Instructions (Signed)
Please try ice as needed. Please try the muscle relaxer as needed. Please try ibuprofen. Please follow-up if your symptoms fail to improve.

## 2020-03-21 ENCOUNTER — Other Ambulatory Visit: Payer: Self-pay | Admitting: Occupational Medicine

## 2020-03-21 ENCOUNTER — Ambulatory Visit: Payer: Self-pay

## 2020-03-21 ENCOUNTER — Other Ambulatory Visit: Payer: Self-pay

## 2020-03-21 DIAGNOSIS — M545 Low back pain, unspecified: Secondary | ICD-10-CM

## 2020-03-28 NOTE — Progress Notes (Signed)
Chief Complaint  Patient presents with   Annual Exam    fasting annual exam. No new concerns.     Crystal [name] is a 54 y.o. female who presents for a complete physical.  She has the following concerns:  She had urgent care evaluation on 6/21 for right shoulder, back and right lower hip pain, after an injury she sustained at work (fell backwards, pulling with R arm to try and get up, and fell again).  She was prescribed muscle relaxant in ER.  She was having a lot of spasms and pain with any walking.  Went to [facility], and was treated with steroids (shot and pills).   She completed the steroids. Muscle relaxants aren't helpful. She is taking 2 Aleve BID, helps some, not enough. She using a topical spray and heating pad. She has been sitting at home, waiting to hear from physical therapy. She had [facility] of [facility] on 03/21/20 showing: IMPRESSION: Mild grade 1 or grade 2 anterolisthesis of L4-5 is noted secondary to posterior facet joint hypertrophy. Moderate degenerative disc disease is noted at L4-5. No acute abnormality seen in the lumbar Spine.  She was seen by me in 01/2020 with R Carpal Tunnel syndrome. She was treated with Meloxicam and wrist brace was recommended. She only used the brace at night.  Did have some symptoms with driving for long periods (not wearing brace).  Took meloxicam for a week.  She is currently not working, and only has some slight tingling, in smaller area of hand. She had been advised to f/u with WC if not improving. She will consider this if gets worse once driving again.  DM:  She is compliant with metformin and Trulicity.  She has lost weight related to being back on Trulicity, cutting back on portions, exercising more. Since her injury, she hasn't been exercising (also took steroids), has been home more so eating more, and has regained some weight. Sugar are running 89-115.  Max 135, even while on steroids for her back. She denies  polydipsia, polyuria or hypoglycemia. She is voiding frequently (gets that way prior to her cycles sometimes, and is on her cycle now), and has some dry mouth.   She denies numbness/tingling/burning or sores in her feet. Last diabetic eye exam was 11/2019, no retinopathy.  Lab Results  Component Value Date   HGBA1C 6.2 (A) 10/06/2019   ADHD: Doing well on Adderall.  She has been taking the Adderall once daily, during the week only.  Last filled #30 on 4/20 (didn't pick up the refills). It is effective and she denies side effects. She hasn't been needing to take it since her injury, and not working.  Hyperlipidemia:She tolerated Crestor without myalgias (previously didn't tolerate atorvastatin due to myalgias).She was at goal on last check. At her last visit in January she had reported she had stopped taking Crestor a month prior due to concerns about memory loss. She had read about it, and was concerned due to her mother's memory.  She hadn't really noticed any difference in her memory while taking it, no difference since off it. She was advised to restart it. She restarted it, but only taking it every other day, and only over the last month. Denies any side effects.  Lab Results  Component Value Date   CHOL 133 03/28/2019   HDL 35 (L) 03/28/2019   LDLCALC 76 03/28/2019   TRIG 110 03/28/2019   CHOLHDL 3.8 03/28/2019   Hypertension--She reports  compliance with her medications, denies side effects.She recalls seeing 122/70's, under 130/80 always. Denies headaches, dizziness.  Allergies: Not currently needing treatment. Flonaseand loraditine are effective seasonally, when needed.  History of anemia:She is s/p EGD and Colonoscopy in August, 2018 (reactive gastropathy, inflammation in stomach, internal hemorrhoids and diverticulosis on colonoscopy).  Gastroenterologist did not see a GI source for iron deficiency, suspectedrelated to menstrual losses.  She does have fibroids. She is  under the care of Dr. Quincy Simmonds. Anemia had improved on last check. She takes iron daily, with only intermittent constipation (drinking more water has helped).  Denies fatigue or dizziness, denies hot flashes or night sweats. Still having cycles.  Last month was heavy, current cycle is "normal".  She had been skipping months (every 3rd month), but this is the first time she had 2 cycles a month apart, and it is lighter. She had been advised by GYN that if she had any recurrent heavy bleeding, she should f/u for [place] and EMB.  Lab Results  Component Value Date   WBC 7.1 10/06/2019   HGB 11.0 (L) 10/06/2019   HCT 33.6 (L) 10/06/2019   MCV 83 10/06/2019   PLT 351 10/06/2019   Lab Results  Component Value Date   IRON 24 (L) 04/03/2014   FERRITIN 24 10/06/2019    Immunization History  Administered Date(s) Administered   Influenza Whole 06/02/2011   Influenza, Seasonal, Injecte, Preservative Fre 10/21/2012   Influenza,inj,Quad PF,6+ Mos 06/09/2013, 07/03/2014, 07/09/2015, 06/23/2016, 09/02/2017, 06/16/2018, 06/29/2019   PFIZER SARS-COV-2 Vaccination 01/14/2020, 02/04/2020   Pneumococcal Polysaccharide-23 10/21/2012   Tdap 09/20/2013   Zoster Recombinat (Shingrix) 07/02/2018, 09/13/2018    Last Pap smear:12/2017 with Dr. Quincy Simmonds, normal. She had cervical polyp removed and STD testing done in 09/2019 through Dr. Quincy Simmonds Last mammogram:07/2019-- L fibroadenoma stable in size (though had increased in 08/2018). Last colonoscopy:04/2017 Last DEXA: never Dentist:not since COVID Ophtho:11/2019, yearly. Exercise: Walking M-F 20-25 minutes prior to her fall/injury.   None since her injury 2 weeks ago.  PMH, PSH, SH and FH were reviewed and updated  Outpatient Encounter Medications as of 03/29/2020  Medication Sig Note   aspirin 81 MG EC tablet Take 1 tablet (81 mg total) by mouth daily. Swallow whole, take with food    Cholecalciferol (VITAMIN D) 2000 units tablet Take 2,000 Units by mouth  daily.    cyclobenzaprine (FLEXERIL) 10 MG tablet Take 1 tablet (10 mg total) by mouth 2 (two) times daily as needed for muscle spasms.    Dulaglutide (TRULICITY) 9.50 DT/2.6ZT SOPN Inject 0.75 mg into the skin once a week.    Ferrous Sulfate (IRON) 325 (65 FE) MG TABS Take 1 tablet by mouth daily.     losartan-hydrochlorothiazide (HYZAAR) 100-25 MG tablet Take 1 tablet by mouth daily.    metFORMIN (GLUCOPHAGE-XR) 500 MG 24 hr tablet TAKE 2 TABLETS BY MOUTH EVERY DAY WITH BREAKFAST    Multiple Vitamins-Minerals (MULTIVITAMIN WITH MINERALS) tablet Take 1 tablet by mouth daily. Reported on 2/45/8099    OneTouch Delica Lancets 83J MISC CHECK GLUCOSE ONCE DAILY    ONETOUCH VERIO test strip GLUCOSE CHECK ONCE DAILY    rosuvastatin (CRESTOR) 5 MG tablet Take 1 tablet (5 mg total) by mouth daily. 03/29/2020: Taking every other day   amphetamine-dextroamphetamine (ADDERALL) 20 MG tablet Take 1 tablet (20 mg total) by mouth daily for 30 doses.    amphetamine-dextroamphetamine (ADDERALL) 20 MG tablet Take 1 tablet (20 mg total) by mouth daily for 30 doses.  amphetamine-dextroamphetamine (ADDERALL) 20 MG tablet Take 1 tablet (20 mg total) by mouth daily for 30 doses. (Patient not taking: Reported on 03/29/2020) 03/29/2020: Not taking since not working from injury.  Usually takes Monday through Friday   fluticasone (FLONASE) 50 MCG/ACT nasal spray SPRAY 2 SPRAYS INTO EACH NOSTRIL EVERY DAY (Patient not taking: Reported on 03/29/2020)    [DISCONTINUED] metFORMIN (GLUCOPHAGE) 500 MG tablet     Facility-Administered Encounter Medications as of 03/29/2020  Medication   0.9 %  sodium chloride infusion   Allergies  Allergen Reactions   Onglyza [Saxagliptin] Other (See Comments)    Eye irritation (when on Kombiglyze; tolerates metformin without problems)  Patient denies allergy to this product. States that it was another medication    ROS: The patient denies anorexia, fever, vision changes, decreased  hearing, ear pain, sore throat, chest pain, palpitations, dizziness, syncope, dyspnea on exertion, cough, swelling, nausea, vomiting, diarrhea, constipation, abdominal pain, melena, indigestion/heartburn (rare, recently), hematuria, incontinence, dysuria, vaginal discharge, odor or itch, genital lesions, numbness, tingling (just R hand from CTS), weakness, tremor, suspicious skin lesions, depression, anxiety, abnormal bleeding/bruising, or enlarged lymph nodes. Moods are doing well. Left breast lump is unchanged. Having pain in R shoulder/upper arm, back and down the back of her R leg since her injury. R CTS--improved, but still has some tingling. Menses--somewhat irregular, every 1-3 months, heavy when 3 months apart. Hoarseness intermittently (occurs while talking a lot).  Voice stays a little raspy for a few days.  Denies any allergy symptoms.  Some heartburn since is at home, eating more, chips (wasn't a factor with the hoarseness).   PHYSICAL EXAM:  BP 128/78    Pulse 80    Ht 5' 5"  (1.651 m)    Wt 260 lb 9.6 oz (118.2 kg)    LMP 03/27/2020    BMI 43.37 kg/m   Wt Readings from Last 3 Encounters:  03/29/20 260 lb 9.6 oz (118.2 kg)  01/30/20 251 lb 9.6 oz (114.1 kg)  10/11/19 269 lb (122 kg)    General Appearance:   Alert, cooperative, no distress, appears stated age. She elected not to change into a gown for today's exam.  Head:   Normocephalic, without obvious abnormality, atraumatic  Eyes:   PERRL, conjunctiva/corneas clear, EOM's intact, fundi benign  Ears:   Normal TM's and external ear canals  Nose:  Not examined (wearing mask due to COVID-19 pandemic)  Throat:  Not examined (wearing mask due to COVID-19 pandemic)  Neck:  Supple, no lymphadenopathy; thyroid: noenlargement/ tenderness/nodules; no carotid bruit or JVD  Back:  Spine nontender, no curvature, no CVA tenderness. She is tender over the lumbar paraspinous muscles on the right.  Lungs:   Clear  to auscultation bilaterally without wheezes, rales orronchi; respirations unlabored  Chest Wall:   No tenderness or deformity  Heart:   Regular rate and rhythm, S1 and S2 normal, no murmur, rub or gallop  Breast Exam:   Deferred to GYN  Abdomen:   Soft, obese, non-tender, nondistended, normoactive bowel sounds, no masses, no hepatosplenomegaly  Genitalia:   Deferred to GYN     Extremities:  No clubbing, cyanosis or edema. Somewhat tender over R deltoid area.  No bruising or swelling  Pulses:  2+ and symmetric all extremities  Skin:  Skin color, texture, turgor normal, no rashes or lesions, though skin exam is limited due to not changing into gown.  Normal skin exam of feet, normal monofilament exam.  Lymph nodes:  Cervical, supraclavicular, and axillary  nodes normal  Neurologic:  CNII-XII intact, normal strength, sensation and gait; reflexes 2+ and symmetric throughout   Psych: Normal mood, affect, hygiene and grooming  DIABETIC FOOT EXAM--normal  Lab Results  Component Value Date   HGBA1C 5.8 (A) 03/29/2020    ASSESSMENT/PLAN:  Annual physical exam - Plan: Lipid panel, Comprehensive metabolic panel, CBC with Differential/Platelet, Vitamin B12, Ferritin, TSH  Iron deficiency anemia, unspecified iron deficiency anemia type - Due for recheck. Has been taking iron daily - Plan: CBC with Differential/Platelet, Ferritin  Attention deficit disorder, unspecified hyperactivity presence - well controlled on current regimen; not taking meds while [place] due to WC injury  Hypertension associated with diabetes ([place])  Essential hypertension, benign - well controlled - Plan: Comprehensive metabolic panel, losartan-hydrochlorothiazide (HYZAAR) 100-25 MG tablet  Dyslipidemia - taking Crestor qod over the last month, tolerating without side effects (had no side effects when taking daily either) - Plan: Lipid panel  Dyslipidemia associated  with type 2 diabetes mellitus ([place])  Type 2 diabetes mellitus with other specified complication, without long-term current use of insulin ([place]) - well controlled, continue metformin and Trulicity - Plan: HgB H6W, Comprehensive metabolic panel, TSH, Microalbumin / creatinine urine ratio, Dulaglutide (TRULICITY) 7.37 TG/6.2IR SOPN, metFORMIN (GLUCOPHAGE-XR) 500 MG 24 hr tablet  Medication monitoring encounter - Plan: Lipid panel, Comprehensive metabolic panel, CBC with Differential/Platelet, Vitamin B12, Ferritin, Microalbumin / creatinine urine ratio   A1c c-met, lipids (only back on crestor qod x 4 weeks), urine microalb, TSH, CBC, ferritin, B12 (on metformin)   Discussed monthly self breast exams and yearly mammograms; at least 30 minutes of aerobic activity at least 5 days/week; weight bearing exercise at least 2x/week;proper sunscreen use reviewed; healthy diet, including goals of calcium and vitamin D intake and alcohol recommendations (less than or equal to 1 drink/day) reviewed; regular seatbelt use; changing batteries in smoke detectors. Immunization recommendations discussed. Continue yearly flu shots.Colonoscopy recommendations reviewed, UTD  Remember that Dr. Quincy Simmonds said if your cycles remain very heavy, she wants to see you back (for ultrasound and possible biopsy).  Please call Dr. Carrolyn Meiers office to check on the status of your physical therapy referral.  We will let you know based on your test results if you need to take your Crestor daily, or if you can remain taking it every other day.  You can take Tylenol (any variety--extra strength, arthritis, regular) between doses of Aleve, if needed.  Call to schedule your routine dental cleaning.

## 2020-03-28 NOTE — Patient Instructions (Addendum)
  HEALTH MAINTENANCE RECOMMENDATIONS:  It is recommended that you get at least 30 minutes of aerobic exercise at least 5 days/week (for weight loss, you may need as much as 60-90 minutes). This can be any activity that gets your heart rate up. This can be divided in 10-15 minute intervals if needed, but try and build up your endurance at least once a week.  Weight bearing exercise is also recommended twice weekly.  Eat a healthy diet with lots of vegetables, fruits and fiber.  "Colorful" foods have a lot of vitamins (ie green vegetables, tomatoes, red peppers, etc).  Limit sweet tea, regular sodas and alcoholic beverages, all of which has a lot of calories and sugar.  Up to 1 alcoholic drink daily may be beneficial for women (unless trying to lose weight, watch sugars).  Drink a lot of water.  Calcium recommendations are 1200-1500 mg daily (1500 mg for postmenopausal women or women without ovaries), and vitamin D 1000 IU daily.  This should be obtained from diet and/or supplements (vitamins), and calcium should not be taken all at once, but in divided doses.  Monthly self breast exams and yearly mammograms for women over the age of 83 is recommended.  Sunscreen of at least SPF 30 should be used on all sun-exposed parts of the skin when outside between the hours of 10 am and 4 pm (not just when at beach or pool, but even with exercise, golf, tennis, and yard work!)  Use a sunscreen that says "broad spectrum" so it covers both UVA and UVB rays, and make sure to reapply every 1-2 hours.  Remember to change the batteries in your smoke detectors when changing your clock times in the spring and fall. Carbon monoxide detectors are recommended for you rhome.  Use your seat belt every time you are in a car, and please drive safely and not be distracted with cell phones and texting while driving.   Remember that Dr. Quincy Simmonds said if your cycles remain very heavy, she wants to see you back (for ultrasound and  possible biopsy).  Please call Dr. Carrolyn Meiers office to check on the status of your physical therapy referral.  We will let you know based on your test results if you need to take your Crestor daily, or if you can remain taking it every other day.  You can take Tylenol (any variety--extra strength, arthritis, regular) between doses of Aleve, if needed.  Call to schedule your routine dental cleaning.

## 2020-03-29 ENCOUNTER — Other Ambulatory Visit: Payer: Self-pay

## 2020-03-29 ENCOUNTER — Encounter: Payer: Self-pay | Admitting: Family Medicine

## 2020-03-29 ENCOUNTER — Ambulatory Visit: Payer: BC Managed Care – PPO | Admitting: Family Medicine

## 2020-03-29 VITALS — BP 128/78 | HR 80 | Ht 65.0 in | Wt 260.6 lb

## 2020-03-29 DIAGNOSIS — Z0000 Encounter for general adult medical examination without abnormal findings: Secondary | ICD-10-CM

## 2020-03-29 DIAGNOSIS — D509 Iron deficiency anemia, unspecified: Secondary | ICD-10-CM | POA: Diagnosis not present

## 2020-03-29 DIAGNOSIS — Z5181 Encounter for therapeutic drug level monitoring: Secondary | ICD-10-CM

## 2020-03-29 DIAGNOSIS — E785 Hyperlipidemia, unspecified: Secondary | ICD-10-CM

## 2020-03-29 DIAGNOSIS — I10 Essential (primary) hypertension: Secondary | ICD-10-CM

## 2020-03-29 DIAGNOSIS — F988 Other specified behavioral and emotional disorders with onset usually occurring in childhood and adolescence: Secondary | ICD-10-CM | POA: Diagnosis not present

## 2020-03-29 DIAGNOSIS — E1159 Type 2 diabetes mellitus with other circulatory complications: Secondary | ICD-10-CM | POA: Diagnosis not present

## 2020-03-29 DIAGNOSIS — E1169 Type 2 diabetes mellitus with other specified complication: Secondary | ICD-10-CM | POA: Diagnosis not present

## 2020-03-29 LAB — POCT GLYCOSYLATED HEMOGLOBIN (HGB A1C): Hemoglobin A1C: 5.8 % — AB (ref 4.0–5.6)

## 2020-03-29 MED ORDER — METFORMIN HCL ER 500 MG PO TB24: ORAL_TABLET | ORAL | 1 refills | Status: DC

## 2020-03-29 MED ORDER — LOSARTAN POTASSIUM-HCTZ 100-25 MG PO TABS: 1.0000 | ORAL_TABLET | Freq: Every day | ORAL | 1 refills | Status: DC

## 2020-03-29 MED ORDER — TRULICITY 0.75 MG/0.5ML ~~LOC~~ SOAJ: 0.7500 mg | SUBCUTANEOUS | 5 refills | Status: DC

## 2020-03-30 LAB — CBC WITH DIFFERENTIAL/PLATELET
Basophils Absolute: 0 10*3/uL (ref 0.0–0.2)
Basos: 0 %
EOS (ABSOLUTE): 0.3 10*3/uL (ref 0.0–0.4)
Eos: 4 %
Hematocrit: 39.1 % (ref 34.0–46.6)
Hemoglobin: 12.3 g/dL (ref 11.1–15.9)
Immature Grans (Abs): 0 10*3/uL (ref 0.0–0.1)
Immature Granulocytes: 0 %
Lymphocytes Absolute: 2.7 10*3/uL (ref 0.7–3.1)
Lymphs: 30 %
MCH: 26.9 pg (ref 26.6–33.0)
MCHC: 31.5 g/dL (ref 31.5–35.7)
MCV: 85 fL (ref 79–97)
Monocytes Absolute: 0.5 10*3/uL (ref 0.1–0.9)
Monocytes: 5 %
Neutrophils Absolute: 5.6 10*3/uL (ref 1.4–7.0)
Neutrophils: 61 %
Platelets: 328 10*3/uL (ref 150–450)
RBC: 4.58 x10E6/uL (ref 3.77–5.28)
RDW: 15 % (ref 11.7–15.4)
WBC: 9.1 10*3/uL (ref 3.4–10.8)

## 2020-03-30 LAB — COMPREHENSIVE METABOLIC PANEL
ALT: 15 IU/L (ref 0–32)
AST: 10 IU/L (ref 0–40)
Albumin/Globulin Ratio: 1.3 (ref 1.2–2.2)
Albumin: 3.8 g/dL (ref 3.8–4.9)
Alkaline Phosphatase: 72 IU/L (ref 48–121)
BUN/Creatinine Ratio: 17 (ref 9–23)
BUN: 13 mg/dL (ref 6–24)
Bilirubin Total: 0.4 mg/dL (ref 0.0–1.2)
CO2: 28 mmol/L (ref 20–29)
Calcium: 9 mg/dL (ref 8.7–10.2)
Chloride: 99 mmol/L (ref 96–106)
Creatinine, Ser: 0.78 mg/dL (ref 0.57–1.00)
GFR calc Af Amer: 101 mL/min/{1.73_m2} (ref >59)
GFR calc non Af Amer: 88 mL/min/{1.73_m2} (ref >59)
Globulin, Total: 3 g/dL (ref 1.5–4.5)
Glucose: 83 mg/dL (ref 65–99)
Potassium: 4.1 mmol/L (ref 3.5–5.2)
Sodium: 138 mmol/L (ref 134–144)
Total Protein: 6.8 g/dL (ref 6.0–8.5)

## 2020-03-30 LAB — VITAMIN B12: Vitamin B-12: 400 pg/mL (ref 232–1245)

## 2020-03-30 LAB — TSH: TSH: 3.17 u[IU]/mL (ref 0.450–4.500)

## 2020-03-30 LAB — LIPID PANEL
Chol/HDL Ratio: 3.3 ratio (ref 0.0–4.4)
Cholesterol, Total: 154 mg/dL (ref 100–199)
HDL: 47 mg/dL (ref >39)
LDL Chol Calc (NIH): 84 mg/dL (ref 0–99)
Triglycerides: 131 mg/dL (ref 0–149)
VLDL Cholesterol Cal: 23 mg/dL (ref 5–40)

## 2020-03-30 LAB — FERRITIN: Ferritin: 40 ng/mL (ref 15–150)

## 2020-04-05 ENCOUNTER — Other Ambulatory Visit (INDEPENDENT_AMBULATORY_CARE_PROVIDER_SITE_OTHER): Payer: BC Managed Care – PPO

## 2020-04-05 ENCOUNTER — Other Ambulatory Visit: Payer: Self-pay

## 2020-04-05 ENCOUNTER — Telehealth: Payer: Self-pay

## 2020-04-05 DIAGNOSIS — Z1389 Encounter for screening for other disorder: Secondary | ICD-10-CM

## 2020-04-05 LAB — POCT URINALYSIS DIP (PROADVANTAGE DEVICE)
Blood, UA: NEGATIVE
Glucose, UA: NEGATIVE mg/dL
Ketones, POC UA: NEGATIVE mg/dL
Leukocytes, UA: NEGATIVE
Nitrite, UA: NEGATIVE
Protein Ur, POC: NEGATIVE mg/dL
Specific Gravity, Urine: 1.025
Urobilinogen, Ur: 0.2
pH, UA: 5.5 (ref 5.0–8.0)

## 2020-04-05 NOTE — Telephone Encounter (Signed)
Pt had a cpe last week. Pt could not give a urine sample. Pt gave sample today and results are in the chart. Please advise [facility]

## 2020-04-12 ENCOUNTER — Encounter: Payer: BC Managed Care – PPO | Admitting: Family Medicine

## 2020-09-20 ENCOUNTER — Telehealth: Payer: Self-pay | Admitting: Family Medicine

## 2020-09-20 MED ORDER — HYDROCHLOROTHIAZIDE 25 MG PO TABS: 25.0000 mg | ORAL_TABLET | Freq: Every day | ORAL | 1 refills | Status: DC

## 2020-09-20 MED ORDER — LOSARTAN POTASSIUM 100 MG PO TABS: 100.0000 mg | ORAL_TABLET | Freq: Every day | ORAL | 1 refills | Status: DC

## 2020-09-20 NOTE — Telephone Encounter (Signed)
See if she wants from another pharmacy, vs separating the two meds

## 2020-09-20 NOTE — Telephone Encounter (Signed)
Patient is okay with separate for now. Only wants #30 with the intention of checking next month to see if back in stock. Rx's sent.

## 2020-09-20 NOTE — Telephone Encounter (Signed)
[facility] sent request that losartan-hctz 100--25 is on back order  Please send alternate drug hydrochlorothazide 25mg /losartan potassium 100mg  oral tablet take 1 tablet daily  [facility] [address]
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
## 2020-10-11 ENCOUNTER — Encounter: Payer: Self-pay | Admitting: Family Medicine

## 2020-10-11 ENCOUNTER — Other Ambulatory Visit: Payer: Self-pay

## 2020-10-11 ENCOUNTER — Other Ambulatory Visit (HOSPITAL_COMMUNITY)
Admission: RE | Admit: 2020-10-11 | Discharge: 2020-10-11 | Disposition: A | Discharge status: Home / Self Care | Payer: BC Managed Care – PPO | Source: Ambulatory Visit | Attending: Obstetrics and Gynecology | Admitting: Obstetrics and Gynecology

## 2020-10-11 ENCOUNTER — Ambulatory Visit: Payer: BC Managed Care – PPO | Admitting: Family Medicine

## 2020-10-11 ENCOUNTER — Ambulatory Visit: Payer: BC Managed Care – PPO | Admitting: Obstetrics and Gynecology

## 2020-10-11 ENCOUNTER — Encounter: Payer: Self-pay | Admitting: Obstetrics and Gynecology

## 2020-10-11 ENCOUNTER — Telehealth: Payer: Self-pay | Admitting: Obstetrics and Gynecology

## 2020-10-11 VITALS — BP 120/70 | HR 72 | Ht 65.0 in | Wt 275.2 lb

## 2020-10-11 VITALS — BP 142/82 | HR 84 | Ht 64.0 in | Wt 277.0 lb

## 2020-10-11 DIAGNOSIS — Z113 Encounter for screening for infections with a predominantly sexual mode of transmission: Secondary | ICD-10-CM

## 2020-10-11 DIAGNOSIS — D242 Benign neoplasm of left breast: Secondary | ICD-10-CM

## 2020-10-11 DIAGNOSIS — N841 Polyp of cervix uteri: Secondary | ICD-10-CM | POA: Diagnosis present

## 2020-10-11 DIAGNOSIS — Z6841 Body Mass Index (BMI) 40.0 and over, adult: Secondary | ICD-10-CM

## 2020-10-11 DIAGNOSIS — Z01419 Encounter for gynecological examination (general) (routine) without abnormal findings: Secondary | ICD-10-CM | POA: Diagnosis not present

## 2020-10-11 DIAGNOSIS — E049 Nontoxic goiter, unspecified: Secondary | ICD-10-CM | POA: Diagnosis not present

## 2020-10-11 DIAGNOSIS — E1169 Type 2 diabetes mellitus with other specified complication: Secondary | ICD-10-CM | POA: Diagnosis not present

## 2020-10-11 DIAGNOSIS — E1159 Type 2 diabetes mellitus with other circulatory complications: Secondary | ICD-10-CM

## 2020-10-11 DIAGNOSIS — I152 Hypertension secondary to endocrine disorders: Secondary | ICD-10-CM

## 2020-10-11 DIAGNOSIS — F988 Other specified behavioral and emotional disorders with onset usually occurring in childhood and adolescence: Secondary | ICD-10-CM

## 2020-10-11 DIAGNOSIS — Z5181 Encounter for therapeutic drug level monitoring: Secondary | ICD-10-CM

## 2020-10-11 DIAGNOSIS — E785 Hyperlipidemia, unspecified: Secondary | ICD-10-CM

## 2020-10-11 DIAGNOSIS — E1165 Type 2 diabetes mellitus with hyperglycemia: Secondary | ICD-10-CM

## 2020-10-11 LAB — POCT GLYCOSYLATED HEMOGLOBIN (HGB A1C): Hemoglobin A1C: 8 % — AB (ref 4.0–5.6)

## 2020-10-11 MED ORDER — METFORMIN HCL ER 500 MG PO TB24: ORAL_TABLET | ORAL | 1 refills | Status: DC
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
## 2020-10-11 NOTE — Telephone Encounter (Signed)
Please schedule a bilateral dx mammogram and left breast US for my patient at the [facility].  She has a left breast fibroadenoma being followed.

## 2020-10-11 NOTE — Patient Instructions (Signed)
Please increase your metformin to 3 tablets per day this week, and if no stomach issues, further increase to 4 tablets with breakfast daily.  This is the maximum dose. Be sure to check your sugars regularly, and let us know if they are consistently over 140-150 in the mornings, on the 4 pills. Please start checking sugars regularly (this also helps with accountability). It is time to focus on portion control and healthy food choices. Consider using an app to help keep you accountable (LoseIt, Myfitnesspal), vs journaling. Since activity is limited, this is important to keeping your sugar under better control, as well as keep the weight from going up, and hopefully getting it back down.  We discussed depression in relation to chronic pain, and how that relates to stress-eating.  I think counseling would be helpful--double check to see if there is an EAP through your job to avoid excess cost. We may need to consider medication (we briefly discussed duloxetine/cymbalta) if moods aren't improving, and might potentially help with pain.  Try taking the Adderall you have at home to see if this helps with getting you motivated to make some of these changes.  It might help with focus, remembering to check sugars, being able to really meal-plan.

## 2020-10-11 NOTE — Patient Instructions (Signed)

## 2020-10-12 ENCOUNTER — Other Ambulatory Visit: Payer: Self-pay | Admitting: Family Medicine

## 2020-10-12 LAB — T4, FREE: Free T4: 1.1 ng/dL (ref 0.8–1.8)

## 2020-10-12 LAB — RPR: RPR Ser Ql: NONREACTIVE

## 2020-10-12 LAB — HEPATITIS C ANTIBODY
Hepatitis C Ab: NONREACTIVE
SIGNAL TO CUT-OFF: 0.02

## 2020-10-12 LAB — MICROALBUMIN / CREATININE URINE RATIO
Creatinine, Urine: 233 mg/dL
Microalb/Creat Ratio: 7 mg/g creat (ref 0–29)
Microalbumin, Urine: 17 ug/mL

## 2020-10-12 LAB — HEPATITIS B SURFACE ANTIGEN: Hepatitis B Surface Ag: NONREACTIVE

## 2020-10-12 LAB — HIV ANTIBODY (ROUTINE TESTING W REFLEX): HIV 1&2 Ab, 4th Generation: NONREACTIVE

## 2020-10-12 LAB — TSH: TSH: 1.41 mIU/L

## 2020-10-15 LAB — CERVICOVAGINAL ANCILLARY ONLY
Chlamydia: NEGATIVE
Comment: NEGATIVE
Comment: NEGATIVE
Comment: NORMAL
Neisseria Gonorrhea: NEGATIVE
Trichomonas: NEGATIVE

## 2020-10-15 LAB — SURGICAL PATHOLOGY

## 2020-10-16 ENCOUNTER — Other Ambulatory Visit: Payer: Self-pay | Admitting: Obstetrics and Gynecology

## 2020-10-16 DIAGNOSIS — D242 Benign neoplasm of left breast: Secondary | ICD-10-CM

## 2020-10-16 NOTE — Telephone Encounter (Signed)
Patient scheduled at the breast center on 11/27/20 @ 7:20am. Patient informed with time and date, informed patient she can call the breast center daily or every other day for sooner appointment. Patient verbalized she understood.

## 2020-10-19 ENCOUNTER — Other Ambulatory Visit: Payer: Self-pay | Admitting: Family Medicine

## 2020-10-23 NOTE — Progress Notes (Signed)
Pt is coming in July the 15

## 2020-10-25 ENCOUNTER — Ambulatory Visit
Admission: RE | Admit: 2020-10-25 | Discharge: 2020-10-25 | Disposition: A | Discharge status: Home / Self Care | Payer: BC Managed Care – PPO | Source: Ambulatory Visit | Attending: Obstetrics and Gynecology | Admitting: Obstetrics and Gynecology

## 2020-10-25 DIAGNOSIS — E049 Nontoxic goiter, unspecified: Secondary | ICD-10-CM

## 2020-10-31 ENCOUNTER — Telehealth: Payer: Self-pay | Admitting: *Deleted

## 2020-10-31 DIAGNOSIS — E049 Nontoxic goiter, unspecified: Secondary | ICD-10-CM

## 2020-10-31 NOTE — Telephone Encounter (Signed)
Referral placed at [facility] they will call to schedule. Patient is aware.

## 2020-10-31 NOTE — Telephone Encounter (Signed)
-----   Message from Ramond Craver, [place] sent at 10/31/2020  9:22 AM EST ----- Regarding: endocrinology referral Per Dr. Quincy Simmonds "Please contact patient in follow up to her thyroid ultrasound showing a 4.2 cm left thyroid nodule.  This is consistent with what I felt on examination.  She needs to have a thyroid biopsy performed.  I recommend she see endocrinology and would consider Dr. Dwyane Dee or Dr. Kelton Pillar. "  Patient is aware. Thanks!!

## 2020-11-01 NOTE — Telephone Encounter (Signed)
Patient scheduled on 11/07/20 with  Dr. Kelton Pillar.

## 2020-11-06 ENCOUNTER — Other Ambulatory Visit: Payer: Self-pay

## 2020-11-07 ENCOUNTER — Encounter: Payer: Self-pay | Admitting: Internal Medicine

## 2020-11-07 ENCOUNTER — Ambulatory Visit: Payer: BC Managed Care – PPO | Admitting: Internal Medicine

## 2020-11-07 VITALS — BP 140/90 | HR 82 | Ht 64.0 in | Wt 268.5 lb

## 2020-11-07 DIAGNOSIS — E041 Nontoxic single thyroid nodule: Secondary | ICD-10-CM
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
## 2020-11-10 ENCOUNTER — Other Ambulatory Visit: Payer: Self-pay | Admitting: Family Medicine

## 2020-11-11 ENCOUNTER — Other Ambulatory Visit: Payer: Self-pay | Admitting: Family Medicine

## 2020-11-16 ENCOUNTER — Ambulatory Visit
Admission: RE | Admit: 2020-11-16 | Discharge: 2020-11-16 | Disposition: A | Discharge status: Home / Self Care | Payer: BC Managed Care – PPO | Source: Ambulatory Visit | Attending: Internal Medicine | Admitting: Internal Medicine

## 2020-11-16 ENCOUNTER — Other Ambulatory Visit: Payer: Self-pay

## 2020-11-16 ENCOUNTER — Other Ambulatory Visit (HOSPITAL_COMMUNITY)
Admission: RE | Admit: 2020-11-16 | Discharge: 2020-11-16 | Disposition: A | Discharge status: Home / Self Care | Payer: BC Managed Care – PPO | Source: Ambulatory Visit | Attending: Radiology | Admitting: Radiology

## 2020-11-16 DIAGNOSIS — E041 Nontoxic single thyroid nodule: Secondary | ICD-10-CM | POA: Insufficient documentation

## 2020-11-19 LAB — CYTOLOGY - NON PAP

## 2020-11-20 ENCOUNTER — Other Ambulatory Visit: Payer: Self-pay | Admitting: Radiology

## 2020-11-21 ENCOUNTER — Encounter: Payer: Self-pay | Admitting: Internal Medicine

## 2020-11-21 ENCOUNTER — Telehealth: Payer: Self-pay | Admitting: Internal Medicine

## 2020-11-21 NOTE — Telephone Encounter (Signed)
Attempted to call the pt , left a voice mail to check the portal   A portal message was sent to the pt    Clinical History: Left; Mid 4.2 cm; Other 2 dimensions: 3.1 x 2 cm  cannot determine composition hypoechoic TI-RADS - 4  Specimen Submitted: A. THYROID, LMP, FINE NEEDLE ASPIRATION:    FINAL MICROSCOPIC DIAGNOSIS:  - Scant follicular epithelium present (Bethesda category I)    The recommendation is to proceed with another attempt in 3 months vs lobectomy vs short term follow up    Awaiting on response from the pt    [place], MD  [facility]  [facility] [address] [place], [place] [address] Phone: [phone] FAX: [phone]

## 2020-11-22 NOTE — Telephone Encounter (Signed)
Dr Kelton Pillar,  I do not see the comments on the patient's portal to copy for patient. Is it the same as the comments on the telephone encounter?

## 2020-11-23 ENCOUNTER — Encounter: Payer: Self-pay | Admitting: Internal Medicine

## 2020-11-23 DIAGNOSIS — E041 Nontoxic single thyroid nodule: Secondary | ICD-10-CM

## 2020-11-23 NOTE — Telephone Encounter (Signed)
Let another message on 11/23/2020 at [place], MD  [facility]  [facility] [address] [place], [place] [address] Phone: [phone] FAX: [phone]

## 2020-11-27 ENCOUNTER — Other Ambulatory Visit: Payer: BC Managed Care – PPO

## 2020-11-27 ENCOUNTER — Ambulatory Visit
Admission: RE | Admit: 2020-11-27 | Discharge: 2020-11-27 | Disposition: A | Discharge status: Home / Self Care | Payer: BC Managed Care – PPO | Source: Ambulatory Visit | Attending: Obstetrics and Gynecology | Admitting: Obstetrics and Gynecology

## 2020-11-27 ENCOUNTER — Other Ambulatory Visit: Payer: Self-pay

## 2020-11-27 DIAGNOSIS — D242 Benign neoplasm of left breast: Secondary | ICD-10-CM

## 2020-12-16 ENCOUNTER — Other Ambulatory Visit: Payer: Self-pay | Admitting: Family Medicine

## 2021-01-01 ENCOUNTER — Other Ambulatory Visit: Payer: Self-pay | Admitting: Family Medicine

## 2021-01-09 ENCOUNTER — Other Ambulatory Visit: Payer: Self-pay | Admitting: Surgery
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
## 2021-01-16 ENCOUNTER — Encounter: Payer: Self-pay | Admitting: Family Medicine

## 2021-01-16 ENCOUNTER — Ambulatory Visit: Payer: BC Managed Care – PPO | Admitting: Family Medicine

## 2021-01-16 ENCOUNTER — Other Ambulatory Visit: Payer: Self-pay

## 2021-01-16 VITALS — BP 130/82 | HR 84 | Ht 65.0 in | Wt 257.2 lb

## 2021-01-16 DIAGNOSIS — E1169 Type 2 diabetes mellitus with other specified complication: Secondary | ICD-10-CM

## 2021-01-16 DIAGNOSIS — F988 Other specified behavioral and emotional disorders with onset usually occurring in childhood and adolescence: Secondary | ICD-10-CM

## 2021-01-16 DIAGNOSIS — Z23 Encounter for immunization: Secondary | ICD-10-CM

## 2021-01-16 DIAGNOSIS — I152 Hypertension secondary to endocrine disorders: Secondary | ICD-10-CM

## 2021-01-16 DIAGNOSIS — E1159 Type 2 diabetes mellitus with other circulatory complications: Secondary | ICD-10-CM

## 2021-01-16 DIAGNOSIS — Z5181 Encounter for therapeutic drug level monitoring: Secondary | ICD-10-CM

## 2021-01-16 DIAGNOSIS — I10 Essential (primary) hypertension: Secondary | ICD-10-CM

## 2021-01-16 DIAGNOSIS — E041 Nontoxic single thyroid nodule: Secondary | ICD-10-CM

## 2021-01-16 DIAGNOSIS — E785 Hyperlipidemia, unspecified: Secondary | ICD-10-CM

## 2021-01-16 DIAGNOSIS — F321 Major depressive disorder, single episode, moderate: Secondary | ICD-10-CM

## 2021-01-16 LAB — POCT GLYCOSYLATED HEMOGLOBIN (HGB A1C): Hemoglobin A1C: 6.7 % — AB (ref 4.0–5.6)

## 2021-01-16 MED ORDER — AMPHETAMINE-DEXTROAMPHETAMINE 20 MG PO TABS
20.0000 mg | ORAL_TABLET | Freq: Two times a day (BID) | ORAL | 0 refills | Status: DC
Start: 2021-01-16 — End: 2021-11-25

## 2021-01-16 MED ORDER — LOSARTAN POTASSIUM-HCTZ 100-25 MG PO TABS
1.0000 | ORAL_TABLET | Freq: Every day | ORAL | 1 refills | Status: DC
Start: 2021-01-16 — End: 2021-08-02

## 2021-01-16 NOTE — Patient Instructions (Addendum)
Ask for a less sedating muscle relaxant (skelaxin or robaxin), to use in place of cyclobenzaprine. Ask about other medications for pain, especially if not doing surgery right away--these would be things like gabapentin (needs to be started slowly due to sedating side effects). Consider a medication like cymbalta to help with pain, since there may be a component of depression related to chronic pain.  I encourage you to get counseling (through [facility]) If you feel like you are much better with counseling and after seeing the spine doctor and having a plan, or improved from a treatment, then we don't need to talk about the Cymbalta.  Based on today's screening and discussion, I think you could benefit from this mediation--in treating both depression and pain as well.

## 2021-01-17 LAB — COMPREHENSIVE METABOLIC PANEL
ALT: 10 IU/L (ref 0–32)
AST: 8 IU/L (ref 0–40)
Albumin/Globulin Ratio: 1.3 (ref 1.2–2.2)
Albumin: 4.2 g/dL (ref 3.8–4.9)
Alkaline Phosphatase: 77 IU/L (ref 44–121)
BUN/Creatinine Ratio: 14 (ref 9–23)
BUN: 14 mg/dL (ref 6–24)
Bilirubin Total: 0.4 mg/dL (ref 0.0–1.2)
CO2: 27 mmol/L (ref 20–29)
Calcium: 10 mg/dL (ref 8.7–10.2)
Chloride: 104 mmol/L (ref 96–106)
Creatinine, Ser: 0.99 mg/dL (ref 0.57–1.00)
Globulin, Total: 3.2 g/dL (ref 1.5–4.5)
Glucose: 115 mg/dL — ABNORMAL HIGH (ref 65–99)
Potassium: 3.6 mmol/L (ref 3.5–5.2)
Sodium: 144 mmol/L (ref 134–144)
Total Protein: 7.4 g/dL (ref 6.0–8.5)
eGFR: 68 mL/min/{1.73_m2} (ref >59)

## 2021-01-17 LAB — LIPID PANEL
Chol/HDL Ratio: 3.6 ratio (ref 0.0–4.4)
Cholesterol, Total: 111 mg/dL (ref 100–199)
HDL: 31 mg/dL — ABNORMAL LOW (ref >39)
LDL Chol Calc (NIH): 60 mg/dL (ref 0–99)
Triglycerides: 108 mg/dL (ref 0–149)
VLDL Cholesterol Cal: 20 mg/dL (ref 5–40)

## 2021-01-31 ENCOUNTER — Other Ambulatory Visit: Payer: Self-pay | Admitting: Orthopedic Surgery

## 2021-03-04 NOTE — Pre-Procedure Instructions (Signed)
Surgical Instructions    Your procedure is scheduled on Wednesday, June 22nd.  Report to [facility] Main Entrance "A" at 5:30 A.M., then check in with the Admitting office.  Call this number if you have problems the morning of surgery:  [phone]   If you have any questions prior to your surgery date call [phone]: Open Monday-Friday 8am-4pm    Remember:  Do not eat after midnight the night before your surgery  You may drink clear liquids until 4:30 a.m. the morning of your surgery.   Clear liquids allowed are: Water, Non-Citrus Juices (without pulp), Carbonated Beverages, Clear Tea, Black Coffee Only, and Gatorade.   Enhanced Recovery after Surgery for Orthopedics Enhanced Recovery after Surgery is a protocol used to improve the stress on your body and your recovery after surgery.  Patient Instructions The day of surgery (if you have diabetes):  Drink ONE small 10 oz bottle of water by 4:30 am the morning of surgery This bottle was given to you during your hospital  pre-op appointment visit.  Nothing else to drink after completing the  Small 10 oz bottle of water.         If you have questions, please contact your surgeon's office.     Take these medicines the morning of surgery with A SIP OF WATER  fluticasone (FLONASE)  rosuvastatin (CRESTOR) acetaminophen (TYLENOL)-as needed methocarbamol (ROBAXIN)-as needed   As of today, STOP taking any Aspirin (unless otherwise instructed by your surgeon) Aleve, Naproxen, Ibuprofen, Motrin, Advil, Goody's, BC's, all herbal medications, fish oil, and all vitamins.  WHAT DO I DO ABOUT MY DIABETES MEDICATION?  Do not take metFORMIN (GLUCOPHAGE-XR) the morning of surgery.    HOW TO MANAGE YOUR DIABETES BEFORE AND AFTER SURGERY  Why is it important to control my blood sugar before and after surgery? Improving blood sugar levels before and after surgery helps healing and can limit problems. A way of improving blood sugar  control is eating a healthy diet by:  Eating less sugar and carbohydrates  Increasing activity/exercise  Talking with your doctor about reaching your blood sugar goals High blood sugars (greater than 180 mg/dL) can raise your risk of infections and slow your recovery, so you will need to focus on controlling your diabetes during the weeks before surgery. Make sure that the doctor who takes care of your diabetes knows about your planned surgery including the date and location.  How do I manage my blood sugar before surgery? Check your blood sugar at least 4 times a day, starting 2 days before surgery, to make sure that the level is not too high or low.  Check your blood sugar the morning of your surgery when you wake up and every 2 hours until you get to the Short Stay unit.  If your blood sugar is less than 70 mg/dL, you will need to treat for low blood sugar: Do not take insulin. Treat a low blood sugar (less than 70 mg/dL) with  cup of clear juice (cranberry or apple), 4 glucose tablets, OR glucose gel. Recheck blood sugar in 15 minutes after treatment (to make sure it is greater than 70 mg/dL). If your blood sugar is not greater than 70 mg/dL on recheck, call [phone] for further instructions. Report your blood sugar to the short stay nurse when you get to Short Stay.  If you are admitted to the hospital after surgery: Your blood sugar will be checked by the staff and you will probably be given insulin  after surgery (instead of oral diabetes medicines) to make sure you have good blood sugar levels. The goal for blood sugar control after surgery is 80-180 mg/dL..                     Do NOT Smoke (Tobacco/Vaping) or drink Alcohol 24 hours prior to your procedure.  If you use a CPAP at night, you may bring all equipment for your overnight stay.   Contacts, glasses, piercing's, hearing aid's, dentures or partials may not be worn into surgery, please bring cases for these belongings.     For patients admitted to the hospital, discharge time will be determined by your treatment team.   Patients discharged the day of surgery will not be allowed to drive home, and someone needs to stay with them for 24 hours.    Special instructions:   [facility]- Preparing For Surgery  Before surgery, you can play an important role. Because skin is not sterile, your skin needs to be as free of germs as possible. You can reduce the number of germs on your skin by washing with CHG (chlorahexidine gluconate) Soap before surgery.  CHG is an antiseptic cleaner which kills germs and bonds with the skin to continue killing germs even after washing.    Oral Hygiene is also important to reduce your risk of infection.  Remember - BRUSH YOUR TEETH THE MORNING OF SURGERY WITH YOUR REGULAR TOOTHPASTE  Please do not use if you have an allergy to CHG or antibacterial soaps. If your skin becomes reddened/irritated stop using the CHG.  Do not shave (including legs and underarms) for at least 48 hours prior to first CHG shower. It is OK to shave your face.  Please follow these instructions carefully.   Shower the NIGHT BEFORE SURGERY and the MORNING OF SURGERY  If you chose to wash your hair, wash your hair first as usual with your normal shampoo.  After you shampoo, rinse your hair and body thoroughly to remove the shampoo.  Use CHG Soap as you would any other liquid soap. You can apply CHG directly to the skin and wash gently with a scrungie or a clean washcloth.   Apply the CHG Soap to your body ONLY FROM THE NECK DOWN.  Do not use on open wounds or open sores. Avoid contact with your eyes, ears, mouth and genitals (private parts). Wash Face and genitals (private parts)  with your normal soap.   Wash thoroughly, paying special attention to the area where your surgery will be performed.  Thoroughly rinse your body with warm water from the neck down.  DO NOT shower/wash with your normal soap after  using and rinsing off the CHG Soap.  Pat yourself dry with a CLEAN TOWEL.  Wear CLEAN PAJAMAS to bed the night before surgery  Place CLEAN SHEETS on your bed the night before your surgery  DO NOT SLEEP WITH PETS.   Day of Surgery: Shower with CHG soap. Do not wear jewelry, make up, or nail polish. Do not wear lotions, powders, perfumes, or deodorant. Do not shave 48 hours prior to surgery.  Do not bring valuables to the hospital. [facility] is not responsible for any belongings or valuables. Wear Clean/Comfortable clothing the morning of surgery Remember to brush your teeth WITH YOUR REGULAR TOOTHPASTE.   Please read over the following fact sheets that you were given.

## 2021-03-05 ENCOUNTER — Encounter (HOSPITAL_COMMUNITY)
Admission: RE | Admit: 2021-03-05 | Discharge: 2021-03-05 | Disposition: A | Discharge status: Home / Self Care | Payer: No Typology Code available for payment source | Source: Ambulatory Visit | Attending: Orthopedic Surgery | Admitting: Orthopedic Surgery

## 2021-03-05 ENCOUNTER — Encounter (HOSPITAL_COMMUNITY): Payer: Self-pay

## 2021-03-05 ENCOUNTER — Other Ambulatory Visit: Payer: Self-pay

## 2021-03-05 DIAGNOSIS — Z01818 Encounter for other preprocedural examination: Secondary | ICD-10-CM | POA: Diagnosis present

## 2021-03-05 LAB — COMPREHENSIVE METABOLIC PANEL
ALT: 13 U/L (ref 0–44)
AST: 14 U/L — ABNORMAL LOW (ref 15–41)
Albumin: 3.2 g/dL — ABNORMAL LOW (ref 3.5–5.0)
Alkaline Phosphatase: 57 U/L (ref 38–126)
Anion gap: 7 (ref 5–15)
BUN: 11 mg/dL (ref 6–20)
CO2: 28 mmol/L (ref 22–32)
Calcium: 9.1 mg/dL (ref 8.9–10.3)
Chloride: 102 mmol/L (ref 98–111)
Creatinine, Ser: 0.96 mg/dL (ref 0.44–1.00)
GFR, Estimated: >60 mL/min (ref >60)
Glucose, Bld: 188 mg/dL — ABNORMAL HIGH (ref 70–99)
Potassium: 3.3 mmol/L — ABNORMAL LOW (ref 3.5–5.1)
Sodium: 137 mmol/L (ref 135–145)
Total Bilirubin: 0.4 mg/dL (ref 0.3–1.2)
Total Protein: 6.7 g/dL (ref 6.5–8.1)

## 2021-03-05 LAB — CBC WITH DIFFERENTIAL/PLATELET
Abs Immature Granulocytes: 0.01 10*3/uL (ref 0.00–0.07)
Basophils Absolute: 0 10*3/uL (ref 0.0–0.1)
Basophils Relative: 1 %
Eosinophils Absolute: 0.2 10*3/uL (ref 0.0–0.5)
Eosinophils Relative: 3 %
HCT: 36.2 % (ref 36.0–46.0)
Hemoglobin: 11.5 g/dL — ABNORMAL LOW (ref 12.0–15.0)
Immature Granulocytes: 0 %
Lymphocytes Relative: 26 %
Lymphs Abs: 1.5 10*3/uL (ref 0.7–4.0)
MCH: 27.1 pg (ref 26.0–34.0)
MCHC: 31.8 g/dL (ref 30.0–36.0)
MCV: 85.2 fL (ref 80.0–100.0)
Monocytes Absolute: 0.3 10*3/uL (ref 0.1–1.0)
Monocytes Relative: 5 %
Neutro Abs: 3.8 10*3/uL (ref 1.7–7.7)
Neutrophils Relative %: 65 %
Platelets: 246 10*3/uL (ref 150–400)
RBC: 4.25 MIL/uL (ref 3.87–5.11)
RDW: 14.2 % (ref 11.5–15.5)
WBC: 5.8 10*3/uL (ref 4.0–10.5)
nRBC: 0 % (ref 0.0–0.2)

## 2021-03-05 LAB — TYPE AND SCREEN
ABO/RH(D): O POS
Antibody Screen: NEGATIVE

## 2021-03-05 LAB — APTT: aPTT: 31 seconds (ref 24–36)

## 2021-03-05 LAB — SURGICAL PCR SCREEN
MRSA, PCR: NEGATIVE
Staphylococcus aureus: NEGATIVE

## 2021-03-05 LAB — GLUCOSE, CAPILLARY: Glucose-Capillary: 241 mg/dL — ABNORMAL HIGH (ref 70–99)

## 2021-03-05 LAB — PROTIME-INR
INR: 1 (ref 0.8–1.2)
Prothrombin Time: 13 seconds (ref 11.4–15.2)

## 2021-03-05 NOTE — Progress Notes (Signed)
PCP - Dr. Tomi Bamberger Cardiologist -   Chest x-ray -  EKG - 03/05/21 Stress Test -  ECHO -  Cardiac Cath -   Fasting Blood Sugar - 120-140 Checks Blood Sugar  per pt, used to check daily, but lately she hasn't checked it. A1C 01/16/21 was 6.7  Blood Thinner Instructions:  Aspirin Instructions: per pt, last dose ASA 02/23/21  ERAS Protcol - yes PRE-SURGERY Ensure or G2- water   COVID TEST- scheduled 03/11/21   Anesthesia review: n/a  Patient denies shortness of breath, fever, cough and chest pain at PAT appointment   All instructions explained to the patient, with a verbal understanding of the material. Patient agrees to go over the instructions while at home for a better understanding. Patient also instructed to self quarantine after being tested for COVID-19. The opportunity to ask questions was provided.

## 2021-03-11 ENCOUNTER — Encounter: Payer: Self-pay | Admitting: Family Medicine

## 2021-03-11 ENCOUNTER — Other Ambulatory Visit: Payer: Self-pay

## 2021-03-11 ENCOUNTER — Ambulatory Visit: Payer: BC Managed Care – PPO | Admitting: Family Medicine

## 2021-03-11 ENCOUNTER — Other Ambulatory Visit (HOSPITAL_COMMUNITY)
Admission: RE | Admit: 2021-03-11 | Discharge: 2021-03-11 | Disposition: A | Discharge status: Home / Self Care | Payer: BC Managed Care – PPO | Source: Ambulatory Visit | Attending: Orthopedic Surgery | Admitting: Orthopedic Surgery

## 2021-03-11 VITALS — BP 120/70 | HR 72 | Temp 97.7°F | Ht 64.0 in | Wt 261.0 lb

## 2021-03-11 DIAGNOSIS — R21 Rash and other nonspecific skin eruption: Secondary | ICD-10-CM

## 2021-03-11 DIAGNOSIS — Z01812 Encounter for preprocedural laboratory examination: Secondary | ICD-10-CM | POA: Diagnosis present

## 2021-03-11 DIAGNOSIS — Z20822 Contact with and (suspected) exposure to covid-19: Secondary | ICD-10-CM | POA: Diagnosis not present

## 2021-03-11 LAB — SARS CORONAVIRUS 2 (TAT 6-24 HRS): SARS Coronavirus 2: NEGATIVE

## 2021-03-11 MED ORDER — TRIAMCINOLONE ACETONIDE 0.1 % EX CREA: 1.0000 "application " | TOPICAL_CREAM | Freq: Two times a day (BID) | CUTANEOUS | 0 refills | Status: DC | PRN

## 2021-03-11 NOTE — Progress Notes (Signed)
Chief Complaint  Patient presents with   Rash    On left arm x 3 weeks-using OTC hydrocortisone cream. Starting to spread.    3 weeks ago, she started with fine bumps on the left forearm, which were very itchy. She has been scratching at it, and thinks it is starting to spreading to a wider area on the L arm, and now the right back of the hand is itching (no rash noted). She denies any rash on trunk or elsewhere on body. Hydrocortisone cream helps slightly with the itching.  She denies new contact, foods. She does sometimes put her left arm out window while driving.  She recalls having a similar problem on the same arm in the past, thought it was related to heat.  She was prescribed a cream which helped with the itching, and didn't completely resolve until the weather cooled down. Chart reviewed--was prescribed TAC 0.1% on 06/16/18. Visit reviewed, rash was described a little differently.  She is having back surgery on Wednesday with Dr. Lynann Bologna.  PMH, PSH, SH reviewed. Outpatient Encounter Medications as of 03/11/2021  Medication Sig   acetaminophen (TYLENOL) 500 MG tablet Take 1,000 mg by mouth every 6 (six) hours as needed for moderate pain.   Lancets (ONETOUCH DELICA PLUS [id number]) MISC CHECK GLUCOSE ONCE DAILY   losartan-hydrochlorothiazide (HYZAAR) 100-25 MG tablet Take 1 tablet by mouth daily.   metFORMIN (GLUCOPHAGE-XR) 500 MG 24 hr tablet TAKE 4 TABLETS BY MOUTH EVERY DAY WITH BREAKFAST (Patient taking differently: Take 2,000 mg by mouth daily with breakfast.)   ONETOUCH VERIO test strip GLUCOSE CHECK ONCE DAILY   rosuvastatin (CRESTOR) 5 MG tablet Take 1 tablet (5 mg total) by mouth daily.   amphetamine-dextroamphetamine (ADDERALL) 20 MG tablet Take 1 tablet (20 mg total) by mouth 2 (two) times daily. (Patient not taking: Reported on 03/11/2021)   aspirin 81 MG EC tablet Take 1 tablet (81 mg total) by mouth daily. Swallow whole, take with food (Patient not taking: No sig reported)    Cholecalciferol (VITAMIN D) 2000 units tablet Take 2,000 Units by mouth daily. (Patient not taking: No sig reported)   Ferrous Sulfate (IRON) 325 (65 FE) MG TABS Take 325 mg by mouth daily. (Patient not taking: Reported on 03/11/2021)   fluticasone (FLONASE) 50 MCG/ACT nasal spray SPRAY 2 SPRAYS INTO EACH NOSTRIL EVERY DAY (Patient not taking: Reported on 03/11/2021)   meloxicam (MOBIC) 15 MG tablet Take 15 mg by mouth daily. (Patient not taking: No sig reported)   methocarbamol (ROBAXIN) 500 MG tablet Take 500 mg by mouth every 6 (six) hours as needed for muscle spasms. (Patient not taking: Reported on 03/11/2021)   Multiple Vitamins-Minerals (MULTIVITAMIN WITH MINERALS) tablet Take 1 tablet by mouth daily. Reported on 01/02/2016 (Patient not taking: Reported on 03/11/2021)   triamcinolone cream (KENALOG) 0.1 % Apply 1 application topically daily as needed (rash). (Patient not taking: Reported on 03/11/2021)   Facility-Administered Encounter Medications as of 03/11/2021  Medication   0.9 %  sodium chloride infusion   She stopped her vitamins as directed, related to upcoming surgery.  Allergies  Allergen Reactions   Onglyza [Saxagliptin] Other (See Comments)    Eye irritation (when on Kombiglyze; tolerates metformin without problems)  Patient denies allergy to this product. States that it was another medication    ROS: no fever, chills, URI symptoms, headaches, dizziness, chest pain, edema. +back pain. Slight constipation. No nausea, vomiting. Rash per HPI   PHYSICAL EXAM:  BP 120/70   Pulse  72   Temp 97.7 F (36.5 C) (Tympanic)   Ht 5\' 4"  (1.626 m)   Wt 261 lb (118.4 kg)   LMP 02/24/2021 (Approximate)   BMI 44.80 kg/m   Well-appearing, pleasant female, in no distress HEENT: conjunctiva and sclera are clear, EOMI, wearing mask Skin: L forearm--starts just at the lateral epicondyle and extends down the posterior aspect of the forearm. There is diffusely spread fine papules, and some  larger hypopigmented spots throughout this area (not raised, separate the fine papules).  No flaking noted. Skin on hands are normal bilaterally (though she reports the R dorsum itches).  ASSESSMENT/PLAN:  Rash and nonspecific skin eruption - L forearm; Ddx reviewed--photosensitivity (related to HCTZ) vs other contact derm, poss of tinea versicolor reviewed. TAC 0.1% - Plan: triamcinolone cream (KENALOG) 0.1 %  Use the triamcinolone twice daily to the affected areas of skin.  Use it until the rash resolves.  If it is still itchy, but no longer raised or visible, then transition over to the over-the-counter hydrocortisone.  You can also use antihistamines (such as claritin, zyrtec, or benadryl if just taking at night, since benadryl will make you sleepy). If not improving, you can consider trying a topical antifungal twice daily, or using a medicated shampoo such as Selsun Blue. My guess is either an allergic reaction, versus sun sensitivity related to your diuretic, versus possibly tinea versicolor (fungal infection).  Given that it is only your left arm, which is sometimes out the window, I was thinking possible sun sensitivity reaction to the HCTZ.  Be sure to use sunscreen regularly, especially on your left arm if opening your window.

## 2021-03-11 NOTE — Patient Instructions (Signed)
  Use the triamcinolone twice daily to the affected areas of skin.  Use it until the rash resolves.  If it is still itchy, but no longer raised or visible, then transition over to the over-the-counter hydrocortisone.  You can also use antihistamines (such as claritin, zyrtec, or benadryl if just taking at night, since benadryl will make you sleepy). If not improving, you can consider trying a topical antifungal twice daily, or using a medicated shampoo such as Selsun Blue. My guess is either an allergic reaction, versus sun sensitivity related to your diuretic, versus possibly tinea versicolor (fungal infection).  Given that it is only your left arm, which is sometimes out the window, I was thinking possible sun sensitivity reaction to the HCTZ.  Be sure to use sunscreen regularly, especially on your left arm if opening your window.

## 2021-03-13 ENCOUNTER — Inpatient Hospital Stay (HOSPITAL_COMMUNITY)
Admission: RE | Admit: 2021-03-13 | Discharge: 2021-03-14 | DRG: 454 | Disposition: A | Discharge status: Home / Self Care | Payer: No Typology Code available for payment source | Attending: Orthopedic Surgery | Admitting: Orthopedic Surgery

## 2021-03-13 ENCOUNTER — Other Ambulatory Visit: Payer: Self-pay

## 2021-03-13 ENCOUNTER — Inpatient Hospital Stay (HOSPITAL_COMMUNITY): Payer: No Typology Code available for payment source | Admitting: Certified Registered"

## 2021-03-13 ENCOUNTER — Encounter (HOSPITAL_COMMUNITY): Payer: Self-pay | Admitting: Orthopedic Surgery

## 2021-03-13 ENCOUNTER — Inpatient Hospital Stay (HOSPITAL_COMMUNITY): Payer: No Typology Code available for payment source

## 2021-03-13 ENCOUNTER — Encounter (HOSPITAL_COMMUNITY)
Admission: RE | Disposition: A | Discharge status: Home / Self Care | Payer: Self-pay | Source: Home / Self Care | Attending: Orthopedic Surgery

## 2021-03-13 DIAGNOSIS — Z8049 Family history of malignant neoplasm of other genital organs: Secondary | ICD-10-CM | POA: Diagnosis not present

## 2021-03-13 DIAGNOSIS — M5410 Radiculopathy, site unspecified: Secondary | ICD-10-CM | POA: Diagnosis present

## 2021-03-13 DIAGNOSIS — E119 Type 2 diabetes mellitus without complications: Secondary | ICD-10-CM | POA: Diagnosis present

## 2021-03-13 DIAGNOSIS — M532X6 Spinal instabilities, lumbar region: Secondary | ICD-10-CM | POA: Diagnosis present

## 2021-03-13 DIAGNOSIS — M5116 Intervertebral disc disorders with radiculopathy, lumbar region: Secondary | ICD-10-CM | POA: Diagnosis present

## 2021-03-13 DIAGNOSIS — Z7984 Long term (current) use of oral hypoglycemic drugs: Secondary | ICD-10-CM

## 2021-03-13 DIAGNOSIS — F909 Attention-deficit hyperactivity disorder, unspecified type: Secondary | ICD-10-CM | POA: Diagnosis present

## 2021-03-13 DIAGNOSIS — Z419 Encounter for procedure for purposes other than remedying health state, unspecified: Secondary | ICD-10-CM

## 2021-03-13 DIAGNOSIS — D649 Anemia, unspecified: Secondary | ICD-10-CM | POA: Diagnosis present

## 2021-03-13 DIAGNOSIS — Z6841 Body Mass Index (BMI) 40.0 and over, adult: Secondary | ICD-10-CM | POA: Diagnosis not present

## 2021-03-13 DIAGNOSIS — Z83438 Family history of other disorder of lipoprotein metabolism and other lipidemia: Secondary | ICD-10-CM | POA: Diagnosis not present

## 2021-03-13 DIAGNOSIS — I10 Essential (primary) hypertension: Secondary | ICD-10-CM | POA: Diagnosis present

## 2021-03-13 DIAGNOSIS — Z800 Family history of malignant neoplasm of digestive organs: Secondary | ICD-10-CM

## 2021-03-13 DIAGNOSIS — Z9049 Acquired absence of other specified parts of digestive tract: Secondary | ICD-10-CM

## 2021-03-13 DIAGNOSIS — Z808 Family history of malignant neoplasm of other organs or systems: Secondary | ICD-10-CM | POA: Diagnosis not present

## 2021-03-13 DIAGNOSIS — Z888 Allergy status to other drugs, medicaments and biological substances status: Secondary | ICD-10-CM | POA: Diagnosis not present

## 2021-03-13 DIAGNOSIS — G8929 Other chronic pain: Secondary | ICD-10-CM | POA: Diagnosis present

## 2021-03-13 DIAGNOSIS — Z803 Family history of malignant neoplasm of breast: Secondary | ICD-10-CM | POA: Diagnosis not present

## 2021-03-13 DIAGNOSIS — M48061 Spinal stenosis, lumbar region without neurogenic claudication: Principal | ICD-10-CM | POA: Diagnosis present

## 2021-03-13 DIAGNOSIS — Z8249 Family history of ischemic heart disease and other diseases of the circulatory system: Secondary | ICD-10-CM

## 2021-03-13 DIAGNOSIS — Z79899 Other long term (current) drug therapy: Secondary | ICD-10-CM | POA: Diagnosis not present

## 2021-03-13 DIAGNOSIS — E1169 Type 2 diabetes mellitus with other specified complication: Secondary | ICD-10-CM

## 2021-03-13 HISTORY — PX: TRANSFORAMINAL LUMBAR INTERBODY FUSION (TLIF) WITH PEDICLE SCREW FIXATION 1 LEVEL: SHX6141

## 2021-03-13 LAB — GLUCOSE, CAPILLARY
Glucose-Capillary: 146 mg/dL — ABNORMAL HIGH (ref 70–99)
Glucose-Capillary: 163 mg/dL — ABNORMAL HIGH (ref 70–99)
Glucose-Capillary: 275 mg/dL — ABNORMAL HIGH (ref 70–99)
Glucose-Capillary: 237 mg/dL — ABNORMAL HIGH (ref 70–99)

## 2021-03-13 LAB — ABO/RH: ABO/RH(D): O POS

## 2021-03-13 LAB — POCT PREGNANCY, URINE: Preg Test, Ur: NEGATIVE

## 2021-03-13 SURGERY — TRANSFORAMINAL LUMBAR INTERBODY FUSION (TLIF) WITH PEDICLE SCREW FIXATION 1 LEVEL
Anesthesia: General | Site: Spine Lumbar | Laterality: Left

## 2021-03-13 MED ORDER — MENTHOL 3 MG MT LOZG: 1.0000 | LOZENGE | OROMUCOSAL | Status: DC | PRN

## 2021-03-13 MED ORDER — CEFAZOLIN SODIUM-DEXTROSE 2-4 GM/100ML-% IV SOLN: 2.0000 g | INTRAVENOUS | Status: DC

## 2021-03-13 MED ORDER — ONDANSETRON HCL 4 MG/2ML IJ SOLN: 4.0000 mg | Freq: Four times a day (QID) | INTRAMUSCULAR | Status: DC | PRN

## 2021-03-13 MED ORDER — SODIUM CHLORIDE (PF) 0.9 % IJ SOLN
INTRAMUSCULAR | Status: AC
  Filled 2021-03-13: qty 10

## 2021-03-13 MED ORDER — INSULIN ASPART 100 UNIT/ML IJ SOLN
0.0000 [IU] | Freq: Every day | INTRAMUSCULAR | Status: DC
  Administered 2021-03-13: 2 [IU] via SUBCUTANEOUS

## 2021-03-13 MED ORDER — METHOCARBAMOL 500 MG PO TABS
500.0000 mg | ORAL_TABLET | Freq: Four times a day (QID) | ORAL | Status: DC | PRN
  Administered 2021-03-13 – 2021-03-14 (×3): 500 mg via ORAL
  Filled 2021-03-13 (×3): qty 1

## 2021-03-13 MED ORDER — BUPIVACAINE-EPINEPHRINE 0.25% -1:200000 IJ SOLN
INTRAMUSCULAR | Status: DC | PRN
  Administered 2021-03-13: 20 mL

## 2021-03-13 MED ORDER — 0.9 % SODIUM CHLORIDE (POUR BTL) OPTIME
TOPICAL | Status: DC | PRN
  Administered 2021-03-13: 1000 mL

## 2021-03-13 MED ORDER — LOSARTAN POTASSIUM-HCTZ 100-25 MG PO TABS: 1.0000 | ORAL_TABLET | Freq: Every day | ORAL | Status: DC

## 2021-03-13 MED ORDER — DEXAMETHASONE SODIUM PHOSPHATE 10 MG/ML IJ SOLN
INTRAMUSCULAR | Status: DC | PRN
  Administered 2021-03-13: 10 mg via INTRAVENOUS

## 2021-03-13 MED ORDER — SENNOSIDES-DOCUSATE SODIUM 8.6-50 MG PO TABS: 1.0000 | ORAL_TABLET | Freq: Every evening | ORAL | Status: DC | PRN

## 2021-03-13 MED ORDER — OXYCODONE-ACETAMINOPHEN 5-325 MG PO TABS
1.0000 | ORAL_TABLET | ORAL | Status: DC | PRN
  Administered 2021-03-13 – 2021-03-14 (×4): 2 via ORAL
  Filled 2021-03-13 (×4): qty 2

## 2021-03-13 MED ORDER — BUPIVACAINE-EPINEPHRINE (PF) 0.25% -1:200000 IJ SOLN
INTRAMUSCULAR | Status: AC
  Filled 2021-03-13: qty 30

## 2021-03-13 MED ORDER — HYDROMORPHONE HCL 1 MG/ML IJ SOLN
INTRAMUSCULAR | Status: AC
  Filled 2021-03-13: qty 1

## 2021-03-13 MED ORDER — GLYCOPYRROLATE PF 0.2 MG/ML IJ SOSY
PREFILLED_SYRINGE | INTRAMUSCULAR | Status: AC
  Filled 2021-03-13: qty 1

## 2021-03-13 MED ORDER — ACETAMINOPHEN 650 MG RE SUPP: 650.0000 mg | RECTAL | Status: DC | PRN

## 2021-03-13 MED ORDER — SUFENTANIL CITRATE 50 MCG/ML IV SOLN
INTRAVENOUS | Status: DC | PRN
  Administered 2021-03-13 (×3): 10 ug via INTRAVENOUS

## 2021-03-13 MED ORDER — ACETAMINOPHEN 10 MG/ML IV SOLN
INTRAVENOUS | Status: AC
  Filled 2021-03-13: qty 100

## 2021-03-13 MED ORDER — ACETAMINOPHEN 325 MG PO TABS: 650.0000 mg | ORAL_TABLET | ORAL | Status: DC | PRN

## 2021-03-13 MED ORDER — MIDAZOLAM HCL 2 MG/2ML IJ SOLN
INTRAMUSCULAR | Status: DC | PRN
  Administered 2021-03-13: 2 mg via INTRAVENOUS

## 2021-03-13 MED ORDER — ACETAMINOPHEN 160 MG/5ML PO SOLN: 325.0000 mg | Freq: Once | ORAL | Status: DC | PRN

## 2021-03-13 MED ORDER — LACTATED RINGERS IV SOLN: INTRAVENOUS | Status: DC

## 2021-03-13 MED ORDER — CHLORHEXIDINE GLUCONATE 0.12 % MT SOLN
15.0000 mL | Freq: Once | OROMUCOSAL | Status: AC
  Administered 2021-03-13: 15 mL via OROMUCOSAL
  Filled 2021-03-13: qty 15

## 2021-03-13 MED ORDER — SUFENTANIL CITRATE 50 MCG/ML IV SOLN
INTRAVENOUS | Status: AC
  Filled 2021-03-13: qty 1

## 2021-03-13 MED ORDER — ACETAMINOPHEN 325 MG PO TABS: 325.0000 mg | ORAL_TABLET | Freq: Once | ORAL | Status: DC | PRN

## 2021-03-13 MED ORDER — THROMBIN 20000 UNITS EX SOLR: CUTANEOUS | Status: DC | PRN

## 2021-03-13 MED ORDER — ACETAMINOPHEN 10 MG/ML IV SOLN: 1000.0000 mg | Freq: Once | INTRAVENOUS | Status: DC | PRN

## 2021-03-13 MED ORDER — ACETAMINOPHEN 500 MG PO TABS: 1000.0000 mg | ORAL_TABLET | ORAL | Status: DC

## 2021-03-13 MED ORDER — FLEET ENEMA 7-19 GM/118ML RE ENEM: 1.0000 | ENEMA | Freq: Once | RECTAL | Status: DC | PRN

## 2021-03-13 MED ORDER — CHLORHEXIDINE GLUCONATE CLOTH 2 % EX PADS: 6.0000 | MEDICATED_PAD | Freq: Once | CUTANEOUS | Status: DC

## 2021-03-13 MED ORDER — VITAMIN D 50 MCG (2000 UT) PO TABS: 2000.0000 [IU] | ORAL_TABLET | Freq: Every day | ORAL | Status: DC

## 2021-03-13 MED ORDER — HYDROMORPHONE HCL 1 MG/ML IJ SOLN
0.2500 mg | INTRAMUSCULAR | Status: DC | PRN
Start: 2021-03-13 — End: 2021-03-13
  Administered 2021-03-13 (×4): 0.5 mg via INTRAVENOUS

## 2021-03-13 MED ORDER — PHENYLEPHRINE 40 MCG/ML (10ML) SYRINGE FOR IV PUSH (FOR BLOOD PRESSURE SUPPORT)
PREFILLED_SYRINGE | INTRAVENOUS | Status: AC
  Filled 2021-03-13: qty 10

## 2021-03-13 MED ORDER — GLYCOPYRROLATE PF 0.2 MG/ML IJ SOSY
PREFILLED_SYRINGE | INTRAMUSCULAR | Status: DC | PRN
  Administered 2021-03-13: .2 mg via INTRAVENOUS

## 2021-03-13 MED ORDER — DEXAMETHASONE SODIUM PHOSPHATE 10 MG/ML IJ SOLN
INTRAMUSCULAR | Status: AC
  Filled 2021-03-13: qty 1

## 2021-03-13 MED ORDER — POVIDONE-IODINE 7.5 % EX SOLN
Freq: Once | CUTANEOUS | Status: DC
  Filled 2021-03-13: qty 118

## 2021-03-13 MED ORDER — CEFAZOLIN SODIUM-DEXTROSE 2-4 GM/100ML-% IV SOLN
2.0000 g | Freq: Three times a day (TID) | INTRAVENOUS | Status: AC
  Administered 2021-03-13 (×2): 2 g via INTRAVENOUS
  Filled 2021-03-13 (×2): qty 100

## 2021-03-13 MED ORDER — DOCUSATE SODIUM 100 MG PO CAPS
100.0000 mg | ORAL_CAPSULE | Freq: Two times a day (BID) | ORAL | Status: DC
  Administered 2021-03-13 – 2021-03-14 (×2): 100 mg via ORAL
  Filled 2021-03-13 (×2): qty 1

## 2021-03-13 MED ORDER — SCOPOLAMINE 1 MG/3DAYS TD PT72
1.0000 | MEDICATED_PATCH | TRANSDERMAL | Status: DC
  Administered 2021-03-13: 1.5 mg via TRANSDERMAL

## 2021-03-13 MED ORDER — LOSARTAN POTASSIUM 50 MG PO TABS
100.0000 mg | ORAL_TABLET | Freq: Every day | ORAL | Status: DC
  Administered 2021-03-14: 100 mg via ORAL
  Filled 2021-03-13: qty 2

## 2021-03-13 MED ORDER — PROPOFOL 10 MG/ML IV BOLUS
INTRAVENOUS | Status: AC
  Filled 2021-03-13: qty 20

## 2021-03-13 MED ORDER — ONDANSETRON HCL 4 MG PO TABS: 4.0000 mg | ORAL_TABLET | Freq: Four times a day (QID) | ORAL | Status: DC | PRN

## 2021-03-13 MED ORDER — PROMETHAZINE HCL 25 MG/ML IJ SOLN
INTRAMUSCULAR | Status: AC
  Filled 2021-03-13: qty 1

## 2021-03-13 MED ORDER — ENSURE PRE-SURGERY PO LIQD
296.0000 mL | Freq: Once | ORAL | Status: DC
  Filled 2021-03-13: qty 296

## 2021-03-13 MED ORDER — THROMBIN 20000 UNITS EX SOLR
CUTANEOUS | Status: AC
  Filled 2021-03-13: qty 20000

## 2021-03-13 MED ORDER — SUGAMMADEX SODIUM 200 MG/2ML IV SOLN
INTRAVENOUS | Status: DC | PRN
  Administered 2021-03-13: 200 mg via INTRAVENOUS

## 2021-03-13 MED ORDER — PROMETHAZINE HCL 25 MG/ML IJ SOLN
6.2500 mg | INTRAMUSCULAR | Status: DC | PRN
  Administered 2021-03-13: 6.25 mg via INTRAVENOUS

## 2021-03-13 MED ORDER — METHOCARBAMOL 500 MG PO TABS
ORAL_TABLET | ORAL | Status: AC
  Filled 2021-03-13: qty 1

## 2021-03-13 MED ORDER — BISACODYL 5 MG PO TBEC: 5.0000 mg | DELAYED_RELEASE_TABLET | Freq: Every day | ORAL | Status: DC | PRN

## 2021-03-13 MED ORDER — PHENOL 1.4 % MT LIQD: 1.0000 | OROMUCOSAL | Status: DC | PRN

## 2021-03-13 MED ORDER — MORPHINE SULFATE (PF) 2 MG/ML IV SOLN
1.0000 mg | INTRAVENOUS | Status: DC | PRN
  Administered 2021-03-13: 2 mg via INTRAVENOUS
  Filled 2021-03-13: qty 1

## 2021-03-13 MED ORDER — MEPERIDINE HCL 25 MG/ML IJ SOLN: 6.2500 mg | INTRAMUSCULAR | Status: DC | PRN

## 2021-03-13 MED ORDER — SODIUM CHLORIDE 0.9% FLUSH: 3.0000 mL | INTRAVENOUS | Status: DC | PRN

## 2021-03-13 MED ORDER — ROCURONIUM BROMIDE 10 MG/ML (PF) SYRINGE
PREFILLED_SYRINGE | INTRAVENOUS | Status: DC | PRN
  Administered 2021-03-13: 40 mg via INTRAVENOUS
  Administered 2021-03-13: 60 mg via INTRAVENOUS

## 2021-03-13 MED ORDER — METFORMIN HCL ER 500 MG PO TB24
2000.0000 mg | ORAL_TABLET | Freq: Every day | ORAL | Status: DC
  Administered 2021-03-14: 2000 mg via ORAL
  Filled 2021-03-13: qty 4

## 2021-03-13 MED ORDER — MIDAZOLAM HCL 2 MG/2ML IJ SOLN
INTRAMUSCULAR | Status: AC
  Filled 2021-03-13: qty 2

## 2021-03-13 MED ORDER — METHOCARBAMOL 500 MG PO TABS
500.0000 mg | ORAL_TABLET | Freq: Four times a day (QID) | ORAL | Status: DC | PRN
  Administered 2021-03-13: 500 mg via ORAL

## 2021-03-13 MED ORDER — ROCURONIUM BROMIDE 10 MG/ML (PF) SYRINGE
PREFILLED_SYRINGE | INTRAVENOUS | Status: AC
  Filled 2021-03-13: qty 10

## 2021-03-13 MED ORDER — BUPIVACAINE LIPOSOME 1.3 % IJ SUSP
INTRAMUSCULAR | Status: DC | PRN
  Administered 2021-03-13: 20 mL

## 2021-03-13 MED ORDER — ONDANSETRON HCL 4 MG/2ML IJ SOLN
INTRAMUSCULAR | Status: AC
  Filled 2021-03-13: qty 2

## 2021-03-13 MED ORDER — HYDROCODONE-ACETAMINOPHEN 5-325 MG PO TABS
1.0000 | ORAL_TABLET | ORAL | Status: DC | PRN
  Administered 2021-03-14: 2 via ORAL
  Filled 2021-03-13: qty 2

## 2021-03-13 MED ORDER — HYDROCHLOROTHIAZIDE 25 MG PO TABS
25.0000 mg | ORAL_TABLET | Freq: Every day | ORAL | Status: DC
  Administered 2021-03-14: 25 mg via ORAL
  Filled 2021-03-13: qty 1

## 2021-03-13 MED ORDER — AMISULPRIDE (ANTIEMETIC) 5 MG/2ML IV SOLN: 10.0000 mg | Freq: Once | INTRAVENOUS | Status: DC | PRN

## 2021-03-13 MED ORDER — PHENYLEPHRINE HCL-NACL 10-0.9 MG/250ML-% IV SOLN
INTRAVENOUS | Status: DC | PRN
  Administered 2021-03-13: 15 ug/min via INTRAVENOUS

## 2021-03-13 MED ORDER — LIDOCAINE HCL (PF) 2 % IJ SOLN
INTRAMUSCULAR | Status: AC
  Filled 2021-03-13: qty 5

## 2021-03-13 MED ORDER — ORAL CARE MOUTH RINSE: 15.0000 mL | Freq: Once | OROMUCOSAL | Status: AC

## 2021-03-13 MED ORDER — POTASSIUM CHLORIDE IN NACL 20-0.9 MEQ/L-% IV SOLN: INTRAVENOUS | Status: DC

## 2021-03-13 MED ORDER — SODIUM CHLORIDE 0.9% FLUSH: 3.0000 mL | Freq: Two times a day (BID) | INTRAVENOUS | Status: DC

## 2021-03-13 MED ORDER — ALUM & MAG HYDROXIDE-SIMETH 200-200-20 MG/5ML PO SUSP: 30.0000 mL | Freq: Four times a day (QID) | ORAL | Status: DC | PRN

## 2021-03-13 MED ORDER — SODIUM CHLORIDE 0.9 % IV SOLN: 250.0000 mL | INTRAVENOUS | Status: DC

## 2021-03-13 MED ORDER — BUPIVACAINE LIPOSOME 1.3 % IJ SUSP
INTRAMUSCULAR | Status: AC
  Filled 2021-03-13: qty 20

## 2021-03-13 MED ORDER — LIDOCAINE HCL (PF) 2 % IJ SOLN
INTRAMUSCULAR | Status: DC | PRN
  Administered 2021-03-13: 100 mg via INTRADERMAL

## 2021-03-13 MED ORDER — ROSUVASTATIN CALCIUM 5 MG PO TABS
5.0000 mg | ORAL_TABLET | Freq: Every day | ORAL | Status: DC
  Administered 2021-03-13 – 2021-03-14 (×2): 5 mg via ORAL
  Filled 2021-03-13 (×2): qty 1

## 2021-03-13 MED ORDER — LACTATED RINGERS IV SOLN: INTRAVENOUS | Status: DC | PRN

## 2021-03-13 MED ORDER — ZOLPIDEM TARTRATE 5 MG PO TABS: 5.0000 mg | ORAL_TABLET | Freq: Every evening | ORAL | Status: DC | PRN

## 2021-03-13 MED ORDER — PROPOFOL 10 MG/ML IV BOLUS
INTRAVENOUS | Status: DC | PRN
  Administered 2021-03-13: 160 mg via INTRAVENOUS

## 2021-03-13 MED ORDER — ONDANSETRON HCL 4 MG/2ML IJ SOLN
INTRAMUSCULAR | Status: DC | PRN
  Administered 2021-03-13: 4 mg via INTRAVENOUS

## 2021-03-13 MED ORDER — CEFAZOLIN SODIUM-DEXTROSE 2-4 GM/100ML-% IV SOLN
2.0000 g | INTRAVENOUS | Status: AC
  Administered 2021-03-13: 2 g via INTRAVENOUS
  Filled 2021-03-13: qty 100

## 2021-03-13 MED ORDER — METHYLENE BLUE 0.5 % INJ SOLN
INTRAVENOUS | Status: AC
  Filled 2021-03-13: qty 10

## 2021-03-13 MED ORDER — ACETAMINOPHEN 10 MG/ML IV SOLN
INTRAVENOUS | Status: DC | PRN
  Administered 2021-03-13: 1000 mg via INTRAVENOUS

## 2021-03-13 MED ORDER — INSULIN ASPART 100 UNIT/ML IJ SOLN: 0.0000 [IU] | Freq: Three times a day (TID) | INTRAMUSCULAR | Status: DC

## 2021-03-13 MED ORDER — METHOCARBAMOL 1000 MG/10ML IJ SOLN
500.0000 mg | Freq: Four times a day (QID) | INTRAVENOUS | Status: DC | PRN
  Filled 2021-03-13: qty 5

## 2021-03-13 MED ORDER — SCOPOLAMINE 1 MG/3DAYS TD PT72
MEDICATED_PATCH | TRANSDERMAL | Status: AC
  Filled 2021-03-13: qty 1

## 2021-03-13 SURGICAL SUPPLY — 86 items
AGENT HMST KT MTR STRL THRMB (HEMOSTASIS)
APL SKNCLS STERI-STRIP NONHPOA (GAUZE/BANDAGES/DRESSINGS) ×1
BENZOIN TINCTURE PRP APPL 2/3 (GAUZE/BANDAGES/DRESSINGS) ×2 IMPLANT
BONE VIVIGEN FORMABLE 1.3CC (Bone Implant) ×2 IMPLANT
BUR PRESCISION 1.7 ELITE (BURR) ×2 IMPLANT
BUR ROUND FLUTED 5 RND (BURR) ×2 IMPLANT
BUR ROUND PRECISION 4.0 (BURR) IMPLANT
BUR SABER RD CUTTING 3.0 (BURR) IMPLANT
CAGE SABLE 10X26 6-12 8D (Cage) ×1 IMPLANT
CARTRIDGE OIL MAESTRO DRILL (MISCELLANEOUS) ×1 IMPLANT
CNTNR URN SCR LID CUP LEK RST (MISCELLANEOUS) ×1 IMPLANT
CONT SPEC 4OZ STRL OR WHT (MISCELLANEOUS) ×2
COVER BACK TABLE 60X90IN (DRAPES) ×2 IMPLANT
COVER MAYO STAND STRL (DRAPES) ×4 IMPLANT
DIFFUSER DRILL AIR PNEUMATIC (MISCELLANEOUS) ×2 IMPLANT
DRAIN CHANNEL 15F RND FF W/TCR (WOUND CARE) IMPLANT
DRAPE C-ARM 42X72 X-RAY (DRAPES) ×2 IMPLANT
DRAPE C-ARMOR (DRAPES) ×1 IMPLANT
DRAPE POUCH INSTRU U-SHP 10X18 (DRAPES) ×2 IMPLANT
DRAPE SURG 17X23 STRL (DRAPES) ×8 IMPLANT
DURAPREP 26ML APPLICATOR (WOUND CARE) ×2 IMPLANT
ELECT BLADE 4.0 EZ CLEAN MEGAD (MISCELLANEOUS) ×2
ELECT CAUTERY BLADE 6.4 (BLADE) ×2 IMPLANT
ELECT REM PT RETURN 9FT ADLT (ELECTROSURGICAL) ×2
ELECTRODE BLDE 4.0 EZ CLN MEGD (MISCELLANEOUS) ×1 IMPLANT
ELECTRODE REM PT RTRN 9FT ADLT (ELECTROSURGICAL) ×1 IMPLANT
EVACUATOR SILICONE 100CC (DRAIN) IMPLANT
FILTER STRAW FLUID ASPIR (MISCELLANEOUS) ×2 IMPLANT
GAUZE 4X4 16PLY RFD (DISPOSABLE) ×2 IMPLANT
GAUZE SPONGE 4X4 12PLY STRL (GAUZE/BANDAGES/DRESSINGS) ×2 IMPLANT
GLOVE SRG 8 PF TXTR STRL LF DI (GLOVE) ×1 IMPLANT
GLOVE SURG ENC MOIS LTX SZ7 (GLOVE) ×3 IMPLANT
GLOVE SURG ENC MOIS LTX SZ8 (GLOVE) ×3 IMPLANT
GLOVE SURG UNDER POLY LF SZ7 (GLOVE) ×2 IMPLANT
GLOVE SURG UNDER POLY LF SZ8 (GLOVE) ×2
GOWN STRL REUS W/ TWL LRG LVL3 (GOWN DISPOSABLE) ×2 IMPLANT
GOWN STRL REUS W/ TWL XL LVL3 (GOWN DISPOSABLE) ×1 IMPLANT
GOWN STRL REUS W/TWL LRG LVL3 (GOWN DISPOSABLE) ×6
GOWN STRL REUS W/TWL XL LVL3 (GOWN DISPOSABLE) ×4
GRAFT BNE MATRIX VG FRMBL SM 1 (Bone Implant) IMPLANT
IV CATH 14GX2 1/4 (CATHETERS) ×2 IMPLANT
KIT BASIN OR (CUSTOM PROCEDURE TRAY) ×2 IMPLANT
KIT POSITION SURG JACKSON T1 (MISCELLANEOUS) ×2 IMPLANT
KIT TURNOVER KIT B (KITS) ×2 IMPLANT
MARKER SKIN DUAL TIP RULER LAB (MISCELLANEOUS) ×4 IMPLANT
NDL 18GX1X1/2 (RX/OR ONLY) (NEEDLE) ×1 IMPLANT
NDL HYPO 25GX1X1/2 BEV (NEEDLE) ×1 IMPLANT
NDL SPNL 18GX3.5 QUINCKE PK (NEEDLE) ×2 IMPLANT
NEEDLE 18GX1X1/2 (RX/OR ONLY) (NEEDLE) ×2 IMPLANT
NEEDLE 22X1 1/2 (OR ONLY) (NEEDLE) ×4 IMPLANT
NEEDLE HYPO 25GX1X1/2 BEV (NEEDLE) ×2 IMPLANT
NEEDLE SPNL 18GX3.5 QUINCKE PK (NEEDLE) ×4 IMPLANT
NS IRRIG 1000ML POUR BTL (IV SOLUTION) ×2 IMPLANT
OIL CARTRIDGE MAESTRO DRILL (MISCELLANEOUS) ×2
PACK LAMINECTOMY ORTHO (CUSTOM PROCEDURE TRAY) ×2 IMPLANT
PACK UNIVERSAL I (CUSTOM PROCEDURE TRAY) ×2 IMPLANT
PATTIES SURGICAL .5 X.5 (GAUZE/BANDAGES/DRESSINGS) ×1 IMPLANT
PATTIES SURGICAL .5 X1 (DISPOSABLE) ×2 IMPLANT
PATTIES SURGICAL .5X1.5 (GAUZE/BANDAGES/DRESSINGS) ×2 IMPLANT
ROD PRE BENT EXP 40MM (Rod) ×2 IMPLANT
SCREW SET SINGLE INNER (Screw) ×4 IMPLANT
SCREW VIPER CORT FIX 6.00X30 (Screw) ×1 IMPLANT
SCREW VIPER CORT FIX 6X35 (Screw) ×3 IMPLANT
SOL ANTI FOG 6CC (MISCELLANEOUS) IMPLANT
SOLUTION ANTI FOG 6CC (MISCELLANEOUS) ×1
SPONGE INTESTINAL PEANUT (DISPOSABLE) ×2 IMPLANT
SPONGE SURGIFOAM ABS GEL 100 (HEMOSTASIS) ×2 IMPLANT
STRIP CLOSURE SKIN 1/2X4 (GAUZE/BANDAGES/DRESSINGS) ×3 IMPLANT
SURGIFLO W/THROMBIN 8M KIT (HEMOSTASIS) IMPLANT
SUT MNCRL AB 4-0 PS2 18 (SUTURE) ×2 IMPLANT
SUT VIC AB 0 CT1 18XCR BRD 8 (SUTURE) ×1 IMPLANT
SUT VIC AB 0 CT1 8-18 (SUTURE) ×2
SUT VIC AB 1 CT1 18XCR BRD 8 (SUTURE) ×1 IMPLANT
SUT VIC AB 1 CT1 8-18 (SUTURE) ×2
SUT VIC AB 2-0 CT2 18 VCP726D (SUTURE) ×2 IMPLANT
SYR 20ML LL LF (SYRINGE) ×4 IMPLANT
SYR BULB IRRIG 60ML STRL (SYRINGE) ×2 IMPLANT
SYR CONTROL 10ML LL (SYRINGE) ×4 IMPLANT
SYR TB 1ML LUER SLIP (SYRINGE) ×2 IMPLANT
TAP EXPEDIUM DL 4.35 (INSTRUMENTS) ×1 IMPLANT
TAP EXPEDIUM DL 5.0 (INSTRUMENTS) ×1 IMPLANT
TAP EXPEDIUM DL 6.0 (INSTRUMENTS) ×1 IMPLANT
TAPE CLOTH SURG 4X10 WHT LF (GAUZE/BANDAGES/DRESSINGS) ×1 IMPLANT
TRAY FOLEY MTR SLVR 16FR STAT (SET/KITS/TRAYS/PACK) ×2 IMPLANT
WATER STERILE IRR 1000ML POUR (IV SOLUTION) ×2 IMPLANT
YANKAUER SUCT BULB TIP NO VENT (SUCTIONS) ×2 IMPLANT

## 2021-03-13 NOTE — Transfer of Care (Signed)
Immediate Anesthesia Transfer of Care Note  Patient: Crystal [name]  Procedure(s) Performed: LEFT-SIDED LUMBAR FOUR - LUMBAR FIVE TRANSFORAMINAL LUMBAR INTERBODY FUSION WITH INSTRUMENTATION AND ALLOGRAFT (Left: Spine Lumbar)  Patient Location: PACU  Anesthesia Type:General  Level of Consciousness: drowsy, patient cooperative and responds to stimulation  Airway & Oxygen Therapy: Patient Spontanous Breathing and Patient connected to face mask oxygen  Post-op Assessment: Report given to RN, Post -op Vital signs reviewed and stable and Patient moving all extremities X 4  Post vital signs: Reviewed and stable  Last Vitals:  Vitals Value Taken Time  BP 140/67 03/13/21 1154  Temp    Pulse 70 03/13/21 1158  Resp 19 03/13/21 1158  SpO2 100 % 03/13/21 1158  Vitals shown include unvalidated device data.  Last Pain:  Vitals:   03/13/21 0644  TempSrc:   PainSc: 0-No pain         Complications: No notable events documented.

## 2021-03-13 NOTE — Op Note (Signed)
PATIENT NAME: Crystal [name] RECORD NO.:   [id number]   DATE OF BIRTH: April 05, 1966   DATE OF PROCEDURE: 03/13/2021                               OPERATIVE REPORT     PREOPERATIVE DIAGNOSES: 1. Bilateral lumbar radiculopathy. 2. L4-5 spinal stenosis. 3. Severe L4-5 degenerative disc disease   POSTOPERATIVE DIAGNOSES: 1. Bilateral lumbar radiculopathy. 2. L4-5 spinal stenosis. 3. Severe L4-5 degenerative disc disease   PROCEDURES: 1. Bilateral L4/5 decompression 2. Left-sided L4-5 transforaminal lumbar interbody fusion. 3. Right-sided L4-5 posterolateral fusion. 4. Insertion of interbody device x1 (Globus Sable expandable intervertebral spacer). 5. Placement of segmental posterior instrumentation L4, L5 bilaterally  6. Use of local autograft. 7. Use of morselized allograft - Vivigen 8. Intraoperative use of fluoroscopy.   SURGEON:  Phylliss Bob, MD.   ASSISTANTPricilla Holm, PA-C.   ANESTHESIA:  General endotracheal anesthesia.   COMPLICATIONS:  None.   DISPOSITION:  Stable.   ESTIMATED BLOOD LOSS:  100cc   INDICATIONS FOR SURGERY:  Briefly,  [name] is a pleasant 55 year old female who did present to me with severe and ongoing pain in the right and left legs. I did feel that the symptoms were secondary to the findings noted above.   The patient failed conservative care and did wish to proceed with the procedure  noted above.   OPERATIVE DETAILS:  On 03/13/2021, the patient was brought to surgery and general endotracheal anesthesia was administered.  The patient was placed prone on a well-padded flat Jackson bed with a spinal frame.  Antibiotics were given and a time-out procedure was performed. The back was prepped and draped in the usual fashion.  A midline incision was made overlying the L4-5 intervertebral spaces.  The fascia was incised at the midline.  The paraspinal musculature was bluntly swept laterally.  Anatomic landmarks for the pedicles  were exposed. Using fluoroscopy, I did cannulate the L4 and L5 pedicles bilaterally, using a medial to lateral cortical trajectory technique.  At this point, 6 x 35 mm screws were placed into the right pedicles, and a 40 mm rod was placed into the tulip heads of the screw, and caps were also placed.  Distraction was then applied across the L4-5 intervertebral space, and the caps were then provisionally tightened.  On the left side, bone wax was placed into the cannulated pedicle holes.  I then proceeded with the decompressive aspect of the procedure at the L4-5 level.  A partial facetectomy was performed bilaterally at L4-5, decompressing the L4-5 intervertebral space.  I was very pleased with the decompression. With an assistant holding medial retraction of the traversing left L5 nerve, I did perform an annulotomy at the posterolateral aspect of the L4-5 intervertebral space.  I then used a series of curettes and pituitary rongeurs to perform a thorough and complete intervertebral diskectomy.  The intervertebral space was then liberally packed with autograft as well as allograft in the form of Vivigen, as was the appropriate-sized intervertebral spacer.  The spacer was then tamped into position in the usual fashion.  At this point, the intervertebral spacer was expanded to approximately 9.5 mm in height. I was very pleased with the press-fit of the spacer.  I then placed 6 mm screws on the left at L4 and L5. A 40-mm rod was then placed and caps were placed. The distraction was then released  on the contralateral side.  All caps were then locked.  The wound was copiously irrigated with a total of approximately 3 L prior to placing the bone graft.  Additional autograft and allograft was then packed into the posterolateral gutter on the right side to help aid in the success of the fusion.  The wound was  explored for any undue bleeding and there was no substantial bleeding encountered.  Gel-Foam was  placed over the laminectomy site.  The wound was then closed in layers using #1 Vicryl followed by 2-0 Vicryl, followed by 4-0 Monocryl.  Benzoin and Steri-Strips were applied followed by sterile dressing.    Of note, Pricilla Holm was my assistant throughout surgery, and did aid in retraction, suctioning, placement of the hardware, and closure.     Phylliss Bob, MD

## 2021-03-13 NOTE — Anesthesia Procedure Notes (Signed)
Procedure Name: Intubation Date/Time: 03/13/2021 7:43 AM Performed by: Claris Che, CRNA Pre-anesthesia Checklist: Patient identified, Emergency Drugs available, Suction available, Patient being monitored and Timeout performed Patient Re-evaluated:Patient Re-evaluated prior to induction Oxygen Delivery Method: Circle system utilized Preoxygenation: Pre-oxygenation with 100% oxygen Induction Type: Cricoid Pressure applied Ventilation: Mask ventilation without difficulty Laryngoscope Size: Mac and 4 Grade View: Grade I Tube type: Oral Tube size: 8.0 mm Number of attempts: 1 Airway Equipment and Method: Stylet Placement Confirmation: ETT inserted through vocal cords under direct vision, positive ETCO2 and breath sounds checked- equal and bilateral Secured at: 22 cm Tube secured with: Tape Dental Injury: Teeth and Oropharynx as per pre-operative assessment

## 2021-03-13 NOTE — Anesthesia Postprocedure Evaluation (Signed)
Anesthesia Post Note  Patient: Crystal [name]  Procedure(s) Performed: LEFT-SIDED LUMBAR FOUR - LUMBAR FIVE TRANSFORAMINAL LUMBAR INTERBODY FUSION WITH INSTRUMENTATION AND ALLOGRAFT (Left: Spine Lumbar)     Patient location during evaluation: PACU Anesthesia Type: General Level of consciousness: awake and alert Pain management: pain level controlled Vital Signs Assessment: post-procedure vital signs reviewed and stable Respiratory status: spontaneous breathing, nonlabored ventilation, respiratory function stable and patient connected to nasal cannula oxygen Cardiovascular status: blood pressure returned to baseline and stable Postop Assessment: no apparent nausea or vomiting Anesthetic complications: no   No notable events documented.  Last Vitals:  Vitals:   03/13/21 1310 03/13/21 1359  BP: 117/67 129/70  Pulse: 84 76  Resp: 13 18  Temp:  36.7 C  SpO2: 98% 96%    Last Pain:  Vitals:   03/13/21 1359  TempSrc: Oral  PainSc:                  [name]
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
## 2021-03-13 NOTE — Anesthesia Preprocedure Evaluation (Addendum)
Anesthesia Evaluation  Patient identified by MRN, date of birth, ID band Patient awake    Reviewed: Allergy & Precautions, NPO status , Patient's Chart, lab work & pertinent test results  Airway Mallampati: II  TM Distance: >3 FB Neck ROM: Full    Dental  (+) Teeth Intact, Dental Advisory Given   Pulmonary neg pulmonary ROS,    breath sounds clear to auscultation       Cardiovascular hypertension, Pt. on medications  Rhythm:Regular Rate:Normal     Neuro/Psych PSYCHIATRIC DISORDERS negative neurological ROS     GI/Hepatic negative GI ROS, Neg liver ROS,   Endo/Other  diabetes, Type 2, Oral Hypoglycemic Agents  Renal/GU      Musculoskeletal   Abdominal (+) + obese,   Peds  Hematology   Anesthesia Other Findings   Reproductive/Obstetrics                            Anesthesia Physical Anesthesia Plan  ASA: 3  Anesthesia Plan: General   Post-op Pain Management:    Induction: Intravenous  PONV Risk Score and Plan: 4 or greater and Ondansetron, Dexamethasone, Midazolam and Scopolamine patch - Pre-op  Airway Management Planned: Oral ETT  Additional Equipment: None  Intra-op Plan:   Post-operative Plan: Extubation in OR  Informed Consent: I have reviewed the patients History and Physical, chart, labs and discussed the procedure including the risks, benefits and alternatives for the proposed anesthesia with the patient or authorized representative who has indicated his/her understanding and acceptance.     Dental advisory given  Plan Discussed with: CRNA  Anesthesia Plan Comments:        Anesthesia Quick Evaluation

## 2021-03-14 ENCOUNTER — Encounter (HOSPITAL_COMMUNITY): Payer: Self-pay | Admitting: Orthopedic Surgery

## 2021-03-14 LAB — GLUCOSE, CAPILLARY: Glucose-Capillary: 163 mg/dL — ABNORMAL HIGH (ref 70–99)

## 2021-03-14 MED ORDER — METHOCARBAMOL 500 MG PO TABS: 500.0000 mg | ORAL_TABLET | Freq: Four times a day (QID) | ORAL | 2 refills | Status: DC | PRN

## 2021-03-14 MED ORDER — OXYCODONE-ACETAMINOPHEN 5-325 MG PO TABS: 1.0000 | ORAL_TABLET | ORAL | 0 refills | Status: DC | PRN

## 2021-03-14 NOTE — Evaluation (Signed)
Occupational Therapy Evaluation Patient Details Name: Crystal [name] MRN: [id number] DOB: [birth year] Today's Date: 03/14/2021    History of Present Illness 55 yo female s/p L4-5 TLIF. PMH including ADHD, anemia, DM, HTN, L TKA, and obesity.   Clinical Impression   PTA, pt was living with alone and was independent; daughter planning to stay at dc. Currently, pt performing ADLs and functional mobility at Mod I level. Provided education and handout back precautions, bed mobility, brace management, grooming, LB ADLs, toileting, and tub transfer; pt demonstrated understanding. Answered all pt questions. Recommend dc home once medically stable per physician. All acute OT needs met and will sign off. Thank you.    Follow Up Recommendations  No OT follow up    Equipment Recommendations  None recommended by OT    Recommendations for Other Services       Precautions / Restrictions Precautions Precautions: Back Precaution Booklet Issued: Yes (comment) Precaution Comments: Reviewed handout and precautions Required Braces or Orthoses: Spinal Brace Spinal Brace: Thoracolumbosacral orthotic;Applied in sitting position      Mobility Bed Mobility               General bed mobility comments: In recliner upon arrival. Reviewed log roll technique    Transfers Overall transfer level: Modified independent               General transfer comment: Increased time. No physical A needed    Balance Overall balance assessment: No apparent balance deficits (not formally assessed)                                         ADL either performed or assessed with clinical judgement   ADL Overall ADL's : Modified independent                                       General ADL Comments: Increased time and effort. PRoviding education on compensatory techniques for back precautions, grooming, LB ADLs, bed mobility, toileting, and tub transfer.     Vision          Perception     Praxis      Pertinent Vitals/Pain Pain Assessment: Faces Faces Pain Scale: Hurts a little bit Pain Location: Back Pain Descriptors / Indicators: Discomfort;Grimacing Pain Intervention(s): Monitored during session;Repositioned     Hand Dominance     Extremity/Trunk Assessment Upper Extremity Assessment Upper Extremity Assessment: Overall WFL for tasks assessed   Lower Extremity Assessment Lower Extremity Assessment: Defer to PT evaluation   Cervical / Trunk Assessment Cervical / Trunk Assessment: Other exceptions Cervical / Trunk Exceptions: s/p L4-5 fusion   Communication Communication Communication: No difficulties   Cognition Arousal/Alertness: Awake/alert Behavior During Therapy: WFL for tasks assessed/performed Overall Cognitive Status: Within Functional Limits for tasks assessed                                     General Comments       Exercises     Shoulder Instructions      Home Living Family/patient expects to be discharged to:: Private residence Living Arrangements: Alone Available Help at Discharge: Family;Available 24 hours/day (Daughter staying at [employer]) Type of Home: House Home Access: Stairs to enter [employer] of  Steps: 5 Entrance Stairs-Rails: Right;Left Home Layout: One level     Bathroom Shower/Tub: Teacher, early years/pre: Standard     Home Equipment: Toilet riser;Hand held shower head          Prior Functioning/Environment Level of Independence: Independent                 OT Problem List: Decreased activity tolerance;Decreased range of motion;Impaired balance (sitting and/or standing)      OT Treatment/Interventions:      OT Goals(Current goals can be found in the care plan section) Acute Rehab OT Goals Patient Stated Goal: Go home OT Goal Formulation: All assessment and education complete, DC therapy  OT Frequency:     Barriers to D/C:             Co-evaluation              AM-PAC OT "6 Clicks" Daily Activity     Outcome Measure Help from another person eating meals?: None Help from another person taking care of personal grooming?: None Help from another person toileting, which includes using toliet, bedpan, or urinal?: None Help from another person bathing (including washing, rinsing, drying)?: None Help from another person to put on and taking off regular upper body clothing?: None Help from another person to put on and taking off regular lower body clothing?: None 6 Click Score: 24   End of Session Equipment Utilized During Treatment: Back brace;Gait belt Nurse Communication: Mobility status  Activity Tolerance: Patient tolerated treatment well Patient left: in chair;with call bell/phone within reach  OT Visit Diagnosis: Unsteadiness on feet (R26.81);Other abnormalities of gait and mobility (R26.89);Muscle weakness (generalized) (M62.81)                Time: 8406-9861 OT Time Calculation (min): 17 min Charges:  OT General Charges $OT Visit: 1 Visit OT Evaluation $OT Eval Low Complexity: [place], OTR/L Acute Rehab Pager: [phone] Office: [place] 03/14/2021, 8:08 AM

## 2021-03-14 NOTE — Progress Notes (Signed)
Patient is discharged from room 3C11 at this time. Alert and in stable condition. IV site d/c'd and instructions read to patient and aunt with understanding verbalized and all questions answered. Left unit via wheelchair with all belongings at side.

## 2021-03-14 NOTE — Progress Notes (Signed)
    Patient doing well  Patient reports resolution of her preoperative leg pain Has been ambulating   Physical Exam: Vitals:   03/13/21 2327 03/14/21 0502  BP: 136/75 (!) 143/75  Pulse: (!) 57 69  Resp: 20 20  Temp: 97.8 F (36.6 C) 98.1 F (36.7 C)  SpO2: 97% 100%   Patient looks excellent, ambulating comfortably in hallway Dressing in place NVI  POD #1 s/p L4/5 decompression and fusion, doing very well  - up with PT/OT, encourage ambulation - Percocet for pain, Robaxin for muscle spasms - d/c home today with f/u in 2 weeks

## 2021-03-14 NOTE — Evaluation (Signed)
Physical Therapy Evaluation Patient Details Name: Crystal [name] MRN: [id number] DOB: 20-Sep-1966 Today's Date: 03/14/2021   History of Present Illness  55 yo female s/p L4-5 TLIF. PMH including ADHD, anemia, DM, HTN, L TKA, and obesity.  Clinical Impression  Pt presents with [facility] strength, mobility, good knowledge and application of spinal precautions with min PT cuing during session, and good balance s/p surgery. Pt ambulatory for great hallway distance without AD, and navigated steps demonstrating ability to enter home at pt d/c. Pt with no further questions, all PT education completed, PT to sign off.      Follow Up Recommendations No PT follow up;Supervision for mobility/OOB    Equipment Recommendations  None recommended by PT    Recommendations for Other Services       Precautions / Restrictions Precautions Precautions: Back Precaution Booklet Issued: Yes (comment) Precaution Comments: Reviewed handout and precautions Required Braces or Orthoses: Spinal Brace Spinal Brace: Thoracolumbosacral orthotic;Applied in sitting position Restrictions Weight Bearing Restrictions: No      Mobility  Bed Mobility               General bed mobility comments: up in recliner upon PT arrival, verbally reviewed log roll in/out of bed    Transfers Overall transfer level: Modified independent Equipment used: None             General transfer comment: Mod I for increased time to rise/sit  Ambulation/Gait Ambulation/Gait assistance: Modified independent (Device/Increase time) Gait Distance (Feet): 240 Feet Assistive device: None Gait Pattern/deviations: Step-through pattern;Decreased stride length Gait velocity: decr   General Gait Details: mod I for increased time, slowed gait but no evidence of imbalance  Stairs Stairs: Yes Stairs assistance: Modified independent (Device/Increase time) Stair Management: One rail Right;Step to pattern;Forwards Number of Stairs:  5 General stair comments: mod I for increased time, use of rail  Wheelchair Mobility    Modified Rankin (Stroke Patients Only)       Balance Overall balance assessment: No apparent balance deficits (not formally assessed)                                           Pertinent Vitals/Pain Pain Assessment: Faces Faces Pain Scale: Hurts a little bit Pain Location: Back Pain Descriptors / Indicators: Discomfort;Grimacing Pain Intervention(s): Limited activity within patient's tolerance;Monitored during session;Repositioned    Home Living Family/patient expects to be discharged to:: Private residence Living Arrangements: Alone Available Help at Discharge: Family;Available 24 hours/day (Daughter staying at [employer]) Type of Home: House Home Access: Stairs to enter Entrance Stairs-Rails: Psychiatric nurse of Steps: 5 Home Layout: One level Home Equipment: Toilet riser;Hand held shower head      Prior Function Level of Independence: Independent               Hand Dominance   Dominant Hand: Right    Extremity/Trunk Assessment   Upper Extremity Assessment Upper Extremity Assessment: Defer to OT evaluation    Lower Extremity Assessment Lower Extremity Assessment: Overall WFL for tasks assessed    Cervical / Trunk Assessment Cervical / Trunk Assessment: Other exceptions Cervical / Trunk Exceptions: s/p L4-5 fusion  Communication   Communication: No difficulties  Cognition Arousal/Alertness: Awake/alert Behavior During Therapy: WFL for tasks assessed/performed Overall Cognitive Status: Within Functional Limits for tasks assessed  General Comments      Exercises Other Exercises Other Exercises: Home walking program: up and walking 1x/hour during waking hours for short household distances to minimize deconditioning, promote circulation, and decrease post-surgical stiffness    Assessment/Plan    PT Assessment Patent does not need any further PT services  PT Problem List         PT Treatment Interventions      PT Goals (Current goals can be found in the Care Plan section)  Acute Rehab PT Goals Patient Stated Goal: Go home PT Goal Formulation: With patient Time For Goal Achievement: 03/14/21 Potential to Achieve Goals: Good    Frequency     Barriers to discharge        Co-evaluation               AM-PAC PT "6 Clicks" Mobility  Outcome Measure Help needed turning from your back to your side while in a flat bed without using bedrails?: None Help needed moving from lying on your back to sitting on the side of a flat bed without using bedrails?: None Help needed moving to and from a bed to a chair (including a wheelchair)?: None Help needed standing up from a chair using your arms (e.g., wheelchair or bedside chair)?: None Help needed to walk in hospital room?: None Help needed climbing 3-5 steps with a railing? : None 6 Click Score: 24    End of Session Equipment Utilized During Treatment: Back brace Activity Tolerance: Patient tolerated treatment well Patient left: in chair;with call bell/phone within reach Nurse Communication: Mobility status PT Visit Diagnosis: Other abnormalities of gait and mobility (R26.89)    Time: 8403-3533 PT Time Calculation (min) (ACUTE ONLY): 12 min   Charges:   PT Evaluation $PT Eval Low Complexity: 1 Low         Myrtle Barnhard S, PT DPT Acute Rehabilitation Services Pager [phone]  Office [phone]   Roxine Caddy E Ruffin Pyo 03/14/2021, 10:28 AM

## 2021-03-21 NOTE — Discharge Summary (Signed)
Patient ID: Crystal [name] MRN: [id number] DOB/AGE: 53-Oct-1967 89 y.o.  Admit date: 03/13/2021 Discharge date: 03/14/2021  Admission Diagnoses:  Active Problems:   Radiculopathy   Discharge Diagnoses:  Same  Past Medical History:  Diagnosis Date   ADHD (attention deficit hyperactivity disorder)    Anemia    Axillary mass, right 2019   Ultrasound - lipoma    Back pain    Blood transfusion without reported diagnosis    1993 - laparoscopic cholecystectomy with re-exploration with laparotomy next day due to bleeding.   Diabetes mellitus    AODM   Diverticulosis 04/2017   noted on colonoscopy   History of gallstones    Hypertension    Impaired fasting glucose    Internal hemorrhoids 04/2017   noted on colonoscopy   Obesity    Unspecified vitamin D deficiency 05/2013    Surgeries: Procedure(s): LEFT-SIDED LUMBAR FOUR - LUMBAR FIVE TRANSFORAMINAL LUMBAR INTERBODY FUSION WITH INSTRUMENTATION AND ALLOGRAFT on 03/13/2021   Consultants: none  Discharged Condition: Improved  Hospital Course: Crystal [name] is an 55 y.o. female who was admitted 03/13/2021 for operative treatment of radiculopathy. Patient has severe unremitting pain that affects sleep, daily activities, and work/hobbies. After pre-op clearance the patient was taken to the operating room on 03/13/2021 and underwent  Procedure(s): LEFT-SIDED LUMBAR FOUR - LUMBAR FIVE TRANSFORAMINAL LUMBAR INTERBODY FUSION WITH INSTRUMENTATION AND ALLOGRAFT.    Patient was given perioperative antibiotics:  Anti-infectives (From admission, onward)    Start     Dose/Rate Route Frequency Ordered Stop   03/14/21 0600  ceFAZolin (ANCEF) IVPB 2g/100 mL premix  Status:  Discontinued        2 g 200 mL/hr over 30 Minutes Intravenous On call to O.R. 03/13/21 1810 03/14/21 1538   03/13/21 1600  ceFAZolin (ANCEF) IVPB 2g/100 mL premix        2 g 200 mL/hr over 30 Minutes Intravenous Every 8 hours 03/13/21 1345 03/14/21 0900   03/13/21 0630   ceFAZolin (ANCEF) IVPB 2g/100 mL premix        2 g 200 mL/hr over 30 Minutes Intravenous On call to O.R. 03/13/21 0737 03/13/21 0757        Patient was given sequential compression devices, early ambulation to prevent DVT.  Patient benefited maximally from hospital stay and there were no complications.    Recent vital signs: BP 119/77 (BP Location: Right Arm)   Pulse 61   Temp 97.9 F (36.6 C) (Oral)   Resp 18   Ht 5\' 5"  (1.651 m)   Wt 116.6 kg   LMP 02/24/2021 (Approximate)   SpO2 100%   BMI 42.77 kg/m    Discharge Medications:   Allergies as of 03/14/2021       Reactions   Onglyza [saxagliptin] Other (See Comments)   Eye irritation (when on [place]; tolerates metformin without problems)  Patient denies allergy to this product. States that it was another medication        Medication List     TAKE these medications    acetaminophen 500 MG tablet Commonly known as: TYLENOL Take 1,000 mg by mouth every 6 (six) hours as needed for moderate pain.   losartan-hydrochlorothiazide 100-25 MG tablet Commonly known as: HYZAAR Take 1 tablet by mouth daily.   methocarbamol 500 MG tablet Commonly known as: ROBAXIN Take 500 mg by mouth every 6 (six) hours as needed for muscle spasms.   methocarbamol 500 MG tablet Commonly known as: ROBAXIN Take 1 tablet (500  mg total) by mouth every 6 (six) hours as needed for muscle spasms.   OneTouch Delica Plus [id number] Misc CHECK GLUCOSE ONCE DAILY   OneTouch Verio test strip Generic drug: glucose blood GLUCOSE CHECK ONCE DAILY   oxyCODONE-acetaminophen 5-325 MG tablet Commonly known as: PERCOCET/ROXICET Take 1-2 tablets by mouth every 4 (four) hours as needed for severe pain.   rosuvastatin 5 MG tablet Commonly known as: CRESTOR Take 1 tablet (5 mg total) by mouth daily.   triamcinolone cream 0.1 % Commonly known as: KENALOG Apply 1 application topically 2 (two) times daily as needed (rash).       ASK your doctor  about these medications    amphetamine-dextroamphetamine 20 MG tablet Commonly known as: ADDERALL Take 1 tablet (20 mg total) by mouth 2 (two) times daily.   fluticasone 50 MCG/ACT nasal spray Commonly known as: FLONASE SPRAY 2 SPRAYS INTO EACH NOSTRIL EVERY DAY   Iron 325 (65 Fe) MG Tabs Take 325 mg by mouth daily.   metFORMIN 500 MG 24 hr tablet Commonly known as: GLUCOPHAGE-XR TAKE 4 TABLETS BY MOUTH EVERY DAY WITH BREAKFAST   multivitamin with minerals tablet Take 1 tablet by mouth daily. Reported on 01/02/2016   Vitamin D 50 MCG (2000 UT) tablet Take 2,000 Units by mouth daily.        Diagnostic Studies: DG Lumbar Spine 2-3 Views  Result Date: 03/13/2021 CLINICAL DATA:  Surgery, elective. Additional history provided: L4-5 TLIF. Provided fluoroscopy time 55 seconds (52.57 mGy). EXAM: LUMBAR SPINE - 2-3 VIEW; DG C-ARM 1-60 MIN COMPARISON:  Intraoperative radiographs of the lumbosacral spine performed earlier today. Lumbar spine radiographs 03/21/2020. FINDINGS: AP and lateral view intraoperative fluoroscopic images of the lumbosacral spine are submitted, 2 images total. The lowest well-formed intervertebral disc space is designated L5-S1. On the provided images, a posterior spinal fusion construct is present at L4-L5 (bilateral pedicle screws and vertical interconnecting rods). Interbody device(s) also present at L4-L5. Persistent L4-L5 grade 1 anterolisthesis. IMPRESSION: Two intraoperative fluoroscopic images of the lumbosacral spine from L4-L5 fusion, as described. Electronically Signed   By: Kellie Simmering DO   On: 03/13/2021 11:52   DG Lumbar Spine 1 View  Result Date: 03/13/2021 CLINICAL DATA:  Surgery, elective. Additional history provided: Intraoperative imaging for localization. EXAM: LUMBAR SPINE - 1 VIEW COMPARISON:  Prior lumbar spine radiographs 03/21/2020. FINDINGS: A single lateral view intraoperative radiograph of the lumbosacral spine is submitted. On the provided  image, a needle projects posterior to the lower lumbar spine at the L4-L5 level. A second needle projects posterior to the sacral spine at the S1-S2 level. L4-L5 grade 1 anterolisthesis. Moderate/advanced L4-L5 disc degeneration. IMPRESSION: Single lateral view intraoperative localizer radiograph of the lumbosacral spine, as described. Electronically Signed   By: Kellie Simmering DO   On: 03/13/2021 11:54   DG C-Arm 1-60 Min  Result Date: 03/13/2021 CLINICAL DATA:  Surgery, elective. Additional history provided: L4-5 TLIF. Provided fluoroscopy time 55 seconds (52.57 mGy). EXAM: LUMBAR SPINE - 2-3 VIEW; DG C-ARM 1-60 MIN COMPARISON:  Intraoperative radiographs of the lumbosacral spine performed earlier today. Lumbar spine radiographs 03/21/2020. FINDINGS: AP and lateral view intraoperative fluoroscopic images of the lumbosacral spine are submitted, 2 images total. The lowest well-formed intervertebral disc space is designated L5-S1. On the provided images, a posterior spinal fusion construct is present at L4-L5 (bilateral pedicle screws and vertical interconnecting rods). Interbody device(s) also present at L4-L5. Persistent L4-L5 grade 1 anterolisthesis. IMPRESSION: Two intraoperative fluoroscopic images of the lumbosacral spine  from L4-L5 fusion, as described. Electronically Signed   By: Kellie Simmering DO   On: 03/13/2021 11:52    Disposition: Discharge disposition: 01-Home or Self Care        POD #1 s/p L4/5 decompression and fusion, doing very well   - up with PT/OT, encourage ambulation - Percocet for pain, Robaxin for muscle spasms -Scripts for pain sent to pharmacy electronically  -D/C instructions sheet printed and in chart -D/C today  -F/U in office 2 weeks   Signed: Lennie Muckle Teara Duerksen 03/21/2021, 11:05 AM

## 2021-03-27 ENCOUNTER — Encounter (HOSPITAL_COMMUNITY): Payer: Self-pay | Admitting: Orthopedic Surgery

## 2021-04-04 ENCOUNTER — Encounter: Payer: BC Managed Care – PPO | Admitting: Family Medicine

## 2021-04-29 ENCOUNTER — Encounter: Payer: Self-pay | Admitting: *Deleted

## 2021-05-08 ENCOUNTER — Ambulatory Visit: Payer: BC Managed Care – PPO | Admitting: Internal Medicine

## 2021-05-22 NOTE — Progress Notes (Signed)
Chief Complaint  Patient presents with   Annual Exam    Fasting annual exam, no gyn exam sees Dr. Quincy Simmonds. Is past due for diabetic eye exam with Dr. Kathlen Mody and she will schedule, last was 3/21. Will take flu shot today. No new concerns.     Crystal [name] is a 55 y.o. female who presents for a complete physical.   She has the following concerns:   She had back surgery in June by Dr. Lynann Bologna, for pain related to [facility] injury that occurred 02/2020. She had been having painful and debilitating muscle spasms prior to her surgery. "I am wonderful"--feels like a new person. She is down to the smaller brace now. Only has slight nagging pain. She remains out of work. She has f/u later this month with plans for PT. She has been walking at home.  Water therapy had been helpful prior to surgery. She had joined [facility] for free time in the water.  Follow-up on diabetes. Her A1c had been up to 8% in 09/2020, when she had dietary noncompliance, stopped Trulicity 03K prior to visit due to cost/affordability.  Metformin dose was instead raised to 20101m daily (taken with dinner).  At her visit in April, sugars were improved, A1c 6.7%. She had reported eating less, having frozen dinners (helped with portion control), not keeping snack foods at home, eating dessert once a week when out. She continues to tolerate metformin without side effects. Sugars have been running 103, 114 fasting. She is eating more fruit (too much?) not checking sugars later in the day. No longer having any junk food, just some popcorn. Denies hypoglycemia, polydipsia, polyuria. She denies numbness/tingling/burning or sores in her feet. Last diabetic eye exam was 11/2019, no retinopathy.   Lab Results  Component Value Date   HGBA1C 6.7 (A) 01/16/2021    She was noted by her GYN to have a L thyroid nodule.  [facility] L thyroid biopsy in 10/2020. Results were inconclusive, insufficient  cells.  Options given to pt included re-try in 3 mos, vs surgical excision of L thyroid, vs recheck [place] She was referred to Dr. BNinfa Linden and plans for surgery (delayed until after back surgery). She feels ready to have this surgery, but wants to check with Dr. DLynann Bolognafirst. Denies any change in size or symptoms related to the nodule.   Hypertension-- She reports compliance with her medications (losartan-HCT), denies side effects.  BP's have been running 119-147/80's, average about 132/82. Denies headaches, dizziness, chest pain, muscle cramps  BP Readings from Last 3 Encounters:  03/14/21 119/77  03/11/21 120/70  03/05/21 (!) 161/71    Hyperlipidemia: She is tolerating daily Crestor without myalgias (previously didn't tolerate atorvastatin due to myalgias).  Lipids were at goal on last check. Lab Results  Component Value Date   CHOL 111 01/16/2021   HDL 31 (L) 01/16/2021   LDLCALC 60 01/16/2021   TRIG 108 01/16/2021   CHOLHDL 3.6 01/16/2021    ADHD: She had been taking the Adderall once daily, during the week only, when working. She stopped taking it when out of work, but we discussed at her April visit to try restarting it--she hadn't been keeping up with her housework, was struggling more with moods.  She did restart it after her last visit, and it did allow her to focus more. Not needing to do much at home, so took a break. She hasn't taken it in about 3 weeks.  She will  be helping grandkids with homework, so plans to restart.   History of anemia: She is s/p EGD and Colonoscopy in August, 2018 (reactive gastropathy, inflammation in stomach, internal hemorrhoids and diverticulosis on colonoscopy).  Gastroenterologist did not see a GI source for iron deficiency, suspected related to menstrual losses.  She does have fibroids. She is under the care of Dr. Quincy Simmonds.  Cycles are lighter than they had been (still heavy, just not severe), and continue to be monthly.  She hasn't taken any iron  since surgery. Denies fatigue or dizziness, denies hot flashes or night sweats.   Lab Results  Component Value Date   WBC 5.8 03/05/2021   HGB 11.5 (L) 03/05/2021   HCT 36.2 03/05/2021   MCV 85.2 03/05/2021   PLT 246 03/05/2021   H/o vitamin D deficiency--low in 2016-2017, normal since 2018, when taking 2000 IU daily.  Only stopped this prior to her surgery in June, hasn't restarted yet.   Immunization History  Administered Date(s) Administered   Influenza Whole 06/02/2011   Influenza, Seasonal, Injecte, Preservative Fre 10/21/2012   Influenza,inj,Quad PF,6+ Mos 06/09/2013, 07/03/2014, 07/09/2015, 06/23/2016, 09/02/2017, 06/16/2018, 06/29/2019   Influenza-Unspecified 07/14/2020   PFIZER Comirnaty(Gray Top)Covid-19 Tri-Sucrose Vaccine 01/16/2021   PFIZER(Purple Top)SARS-COV-2 Vaccination 01/14/2020, 02/04/2020   Pneumococcal Polysaccharide-23 10/21/2012   Tdap 09/20/2013   Zoster Recombinat (Shingrix) 07/02/2018, 09/13/2018    Last Pap smear: 12/2017 with Dr. Quincy Simmonds, normal. She had cervical polyp removed and STD testing done in 09/2019 through Dr. Quincy Simmonds Last mammogram: 11/2020 Last colonoscopy: 04/2017 Last DEXA: never Dentist: every 6 months. Ophtho: 11/2019 Exercise:  walking in the home since back surgery in June.  Prior to that had been getting water exercises (walking, swimming).    PMH, PSH, SH and FH were reviewed and updated  Outpatient Encounter Medications as of 05/23/2021  Medication Sig   Lancets (ONETOUCH DELICA PLUS [id number]) MISC CHECK GLUCOSE ONCE DAILY   losartan-hydrochlorothiazide (HYZAAR) 100-25 MG tablet Take 1 tablet by mouth daily.   metFORMIN (GLUCOPHAGE-XR) 500 MG 24 hr tablet TAKE 4 TABLETS BY MOUTH EVERY DAY WITH BREAKFAST (Patient taking differently: Take 2,000 mg by mouth daily with breakfast.)   ONETOUCH VERIO test strip GLUCOSE CHECK ONCE DAILY   rosuvastatin (CRESTOR) 5 MG tablet Take 1 tablet (5 mg total) by mouth daily.   acetaminophen (TYLENOL)  500 MG tablet Take 1,000 mg by mouth every 6 (six) hours as needed for moderate pain. (Patient not taking: Reported on 05/23/2021)   amphetamine-dextroamphetamine (ADDERALL) 20 MG tablet Take 1 tablet (20 mg total) by mouth 2 (two) times daily. (Patient not taking: No sig reported)   Cholecalciferol (VITAMIN D) 2000 units tablet Take 2,000 Units by mouth daily. (Patient not taking: No sig reported)   Ferrous Sulfate (IRON) 325 (65 FE) MG TABS Take 325 mg by mouth daily. (Patient not taking: No sig reported)   fluticasone (FLONASE) 50 MCG/ACT nasal spray SPRAY 2 SPRAYS INTO EACH NOSTRIL EVERY DAY (Patient not taking: No sig reported)   methocarbamol (ROBAXIN) 500 MG tablet Take 1 tablet (500 mg total) by mouth every 6 (six) hours as needed for muscle spasms. (Patient not taking: Reported on 05/23/2021)   Multiple Vitamins-Minerals (MULTIVITAMIN WITH MINERALS) tablet Take 1 tablet by mouth daily. Reported on 01/02/2016 (Patient not taking: No sig reported)   triamcinolone cream (KENALOG) 0.1 % Apply 1 application topically 2 (two) times daily as needed (rash). (Patient not taking: Reported on 05/23/2021)   [DISCONTINUED] methocarbamol (ROBAXIN) 500 MG tablet Take 500 mg  by mouth every 6 (six) hours as needed for muscle spasms.   [DISCONTINUED] oxyCODONE-acetaminophen (PERCOCET/ROXICET) 5-325 MG tablet Take 1-2 tablets by mouth every 4 (four) hours as needed for severe pain.   Facility-Administered Encounter Medications as of 05/23/2021  Medication   0.9 %  sodium chloride infusion   Allergies  Allergen Reactions   Onglyza [Saxagliptin] Other (See Comments)    Eye irritation (when on [place]; tolerates metformin without problems)  Patient denies allergy to this product. States that it was another medication    ROS:  The patient denies anorexia, fever, vision changes, decreased hearing, ear pain, sore throat, chest pain, palpitations, dizziness, syncope, dyspnea on exertion, cough, swelling, nausea,  vomiting, diarrhea, constipation, abdominal pain, melena, indigestion/heartburn, hematuria, incontinence, dysuria, vaginal discharge, odor or itch, genital lesions, numbness, tingling, weakness, tremor, suspicious skin lesions, depression, anxiety, abnormal bleeding/bruising, or enlarged lymph nodes. Moods are doing well. Denies change to L thyroid nodule, no trouble swallowing. Menses--monthly, not as heavy in the past. Denies hot flashes, night sweats.   PHYSICAL EXAM:  BP 134/80   Pulse 76   Ht 5' 4.5" (1.638 m)   Wt 262 lb 3.2 oz (118.9 kg)   LMP 05/20/2021 (Exact Date)   BMI 44.31 kg/m    Wt Readings from Last 3 Encounters:  05/23/21 262 lb 3.2 oz (118.9 kg)  03/13/21 257 lb (116.6 kg)  03/11/21 261 lb (118.4 kg)    General Appearance:     Alert, cooperative, no distress, appears stated age. She elected not to change into a gown for today's exam.  Head:     Normocephalic, without obvious abnormality, atraumatic   Eyes:     PERRL, conjunctiva/corneas clear, EOM's intact, fundi benign   Ears:     Normal TM's and external ear canals   Nose:    Not examined (wearing mask due to COVID-19 pandemic)  Throat:    Not examined (wearing mask due to COVID-19 pandemic)  Neck:    Supple, no lymphadenopathy;  thyroid: nontender.  Mild enlargement of L lobe, no discrete mass/nodule appreciated today; no carotid bruit or JVD   Back:     Wearing back brace, limits exam; no CVA tenderness.  Lungs:      Clear to auscultation bilaterally without wheezes, rales or ronchi; respirations unlabored   Chest Wall:     No tenderness or deformity    Heart:     Regular rate and rhythm, S1 and S2 normal, no murmur, rub or gallop   Breast Exam:     Deferred to GYN  Abdomen:      Soft, obese, non-tender, nondistended, normoactive bowel sounds,  no masses, no hepatosplenomegaly   Genitalia:     Deferred to GYN        Extremities:    No clubbing, cyanosis or edema.   Pulses:    2+ and symmetric all  extremities   Skin:    Skin color, texture, turgor normal, no rashes or lesions, though skin exam is limited due to not changing into gown.  Normal skin exam of feet, normal monofilament exam.  Lymph nodes:    Cervical, supraclavicular, and inguinal nodes normal   Neurologic:    Normal strength, sensation and gait; reflexes slightly diminished at knees, less on R than L                     Psych:   Normal mood, affect, hygiene and grooming   DIABETIC FOOT EXAM--normal  ASSESSMENT/PLAN:  Annual physical exam - Plan: CBC with Differential/Platelet, Comprehensive metabolic panel, Ferritin  Hypertension associated with diabetes ([place]) - cont current meds  Dyslipidemia associated with type 2 diabetes mellitus ([place]) - Plan: rosuvastatin (CRESTOR) 5 MG tablet  Attention deficit disorder, unspecified hyperactivity presence - cont current med, no refill needed yet. Plans to restart soon, when needs to help grandkids with HW  Thyroid nodule - due to f/u with surgeon, to consider surgery  Need for influenza vaccination - Plan: Flu Vaccine QUAD 6+ mos PF IM (Fluarix Quad PF)  Type 2 diabetes mellitus with other specified complication, without long-term current use of insulin ([facility]) - Increase metformin to 4/d as Trulicity isn't affordable currently. Counseled re: diet, exercise, weight loss. Monitor glu, will help with accountability/diet - Plan: HgB A1c, metFORMIN (GLUCOPHAGE-XR) 500 MG 24 hr tablet, Comprehensive metabolic panel  Dyslipidemia - at goal on Crestor per last check; Cont  Crestor - Plan: rosuvastatin (CRESTOR) 5 MG tablet  Medication monitoring encounter - Plan: CBC with Differential/Platelet, Comprehensive metabolic panel, Ferritin  Iron deficiency anemia, unspecified iron deficiency anemia type - recheck labs, likely needs to restart iron - Plan: CBC with Differential/Platelet, Ferritin   A1c C-met, CBC, ferritin  Suspect should restart iron Restart D   Discussed monthly  self breast exams and yearly mammograms; at least 30 minutes of aerobic activity at least 5 days/week; weight bearing exercise at least 2x/week; proper sunscreen use reviewed; healthy diet, including goals of calcium and vitamin D intake and alcohol recommendations (less than or equal to 1 drink/day) reviewed; regular seatbelt use; changing batteries in smoke detectors.  Immunization recommendations discussed. Continue yearly flu shots, given today. Updated COVID vaccine recommended when available, soon.  Colonoscopy recommendations reviewed, UTD Reminded to schedule diabetic eye exam.  F/u 6 mos med check, sooner prn.

## 2021-05-22 NOTE — Patient Instructions (Addendum)
  HEALTH MAINTENANCE RECOMMENDATIONS:  It is recommended that you get at least 30 minutes of aerobic exercise at least 5 days/week (for weight loss, you may need as much as 60-90 minutes). This can be any activity that gets your heart rate up. This can be divided in 10-15 minute intervals if needed, but try and build up your endurance at least once a week.  Weight bearing exercise is also recommended twice weekly.  Eat a healthy diet with lots of vegetables, fruits and fiber.  "Colorful" foods have a lot of vitamins (ie green vegetables, tomatoes, red peppers, etc).  Limit sweet tea, regular sodas and alcoholic beverages, all of which has a lot of calories and sugar.  Up to 1 alcoholic drink daily may be beneficial for women (unless trying to lose weight, watch sugars).  Drink a lot of water.  Calcium recommendations are 1200-1500 mg daily (1500 mg for postmenopausal women or women without ovaries), and vitamin D 1000 IU daily.  This should be obtained from diet and/or supplements (vitamins), and calcium should not be taken all at once, but in divided doses.  Monthly self breast exams and yearly mammograms for women over the age of 67 is recommended.  Sunscreen of at least SPF 30 should be used on all sun-exposed parts of the skin when outside between the hours of 10 am and 4 pm (not just when at beach or pool, but even with exercise, golf, tennis, and yard work!)  Use a sunscreen that says "broad spectrum" so it covers both UVA and UVB rays, and make sure to reapply every 1-2 hours.  Remember to change the batteries in your smoke detectors when changing your clock times in the spring and fall. Carbon monoxide detectors are recommended for your home.  Use your seat belt every time you are in a car, and please drive safely and not be distracted with cell phones and texting while driving.  Please get the updated COVID vaccine when it becomes available (pay attention to the news).  Please schedule  your diabetic eye exam.  Periodically check sugars 2 hours after a meal or at bedtime (can be in place of your morning sugar). This will help you know if your meal is triggering a much higher sugar (and then to make adjustments--cutting back on fruit portions vs carbs, etc).  Consider going back on the weekly injectable medication (such as Trulicity, which you previously took) if/when finances allow.  Currently your diabetes is adequately controlled with metformin alone.    Please restart your vitamin D. I anticipate also having you restart your iron--I'm checking some labs on this.

## 2021-05-23 ENCOUNTER — Encounter: Payer: Self-pay | Admitting: Family Medicine

## 2021-05-23 ENCOUNTER — Other Ambulatory Visit: Payer: Self-pay

## 2021-05-23 ENCOUNTER — Ambulatory Visit: Payer: BC Managed Care – PPO | Admitting: Family Medicine

## 2021-05-23 VITALS — BP 134/80 | HR 76 | Ht 64.5 in | Wt 262.2 lb

## 2021-05-23 DIAGNOSIS — E785 Hyperlipidemia, unspecified: Secondary | ICD-10-CM

## 2021-05-23 DIAGNOSIS — D509 Iron deficiency anemia, unspecified: Secondary | ICD-10-CM

## 2021-05-23 DIAGNOSIS — E041 Nontoxic single thyroid nodule: Secondary | ICD-10-CM | POA: Diagnosis not present

## 2021-05-23 DIAGNOSIS — E1169 Type 2 diabetes mellitus with other specified complication: Secondary | ICD-10-CM | POA: Diagnosis not present

## 2021-05-23 DIAGNOSIS — E1159 Type 2 diabetes mellitus with other circulatory complications: Secondary | ICD-10-CM | POA: Diagnosis not present

## 2021-05-23 DIAGNOSIS — F988 Other specified behavioral and emotional disorders with onset usually occurring in childhood and adolescence: Secondary | ICD-10-CM | POA: Diagnosis not present

## 2021-05-23 DIAGNOSIS — Z0000 Encounter for general adult medical examination without abnormal findings: Secondary | ICD-10-CM

## 2021-05-23 DIAGNOSIS — I152 Hypertension secondary to endocrine disorders: Secondary | ICD-10-CM

## 2021-05-23 DIAGNOSIS — Z23 Encounter for immunization: Secondary | ICD-10-CM

## 2021-05-23 DIAGNOSIS — Z5181 Encounter for therapeutic drug level monitoring: Secondary | ICD-10-CM

## 2021-05-23 LAB — POCT GLYCOSYLATED HEMOGLOBIN (HGB A1C): Hemoglobin A1C: 6.9 % — AB (ref 4.0–5.6)

## 2021-05-23 MED ORDER — ROSUVASTATIN CALCIUM 5 MG PO TABS: 5.0000 mg | ORAL_TABLET | Freq: Every day | ORAL | 1 refills | Status: DC

## 2021-05-23 MED ORDER — METFORMIN HCL ER 500 MG PO TB24: ORAL_TABLET | ORAL | 1 refills | Status: DC

## 2021-05-24 LAB — CBC WITH DIFFERENTIAL/PLATELET
Basophils Absolute: 0 10*3/uL (ref 0.0–0.2)
Basos: 1 %
EOS (ABSOLUTE): 0.1 10*3/uL (ref 0.0–0.4)
Eos: 2 %
Hematocrit: 31.6 % — ABNORMAL LOW (ref 34.0–46.6)
Hemoglobin: 9.7 g/dL — ABNORMAL LOW (ref 11.1–15.9)
Immature Grans (Abs): 0 10*3/uL (ref 0.0–0.1)
Immature Granulocytes: 0 %
Lymphocytes Absolute: 1.8 10*3/uL (ref 0.7–3.1)
Lymphs: 31 %
MCH: 24.3 pg — ABNORMAL LOW (ref 26.6–33.0)
MCHC: 30.7 g/dL — ABNORMAL LOW (ref 31.5–35.7)
MCV: 79 fL (ref 79–97)
Monocytes Absolute: 0.3 10*3/uL (ref 0.1–0.9)
Monocytes: 6 %
Neutrophils Absolute: 3.5 10*3/uL (ref 1.4–7.0)
Neutrophils: 60 %
Platelets: 323 10*3/uL (ref 150–450)
RBC: 4 x10E6/uL (ref 3.77–5.28)
RDW: 14.7 % (ref 11.7–15.4)
WBC: 5.8 10*3/uL (ref 3.4–10.8)

## 2021-05-24 LAB — COMPREHENSIVE METABOLIC PANEL
ALT: 7 IU/L (ref 0–32)
AST: 11 IU/L (ref 0–40)
Albumin/Globulin Ratio: 1.3 (ref 1.2–2.2)
Albumin: 3.9 g/dL (ref 3.8–4.9)
Alkaline Phosphatase: 77 IU/L (ref 44–121)
BUN/Creatinine Ratio: 12 (ref 9–23)
BUN: 10 mg/dL (ref 6–24)
Bilirubin Total: 0.2 mg/dL (ref 0.0–1.2)
CO2: 24 mmol/L (ref 20–29)
Calcium: 9.4 mg/dL (ref 8.7–10.2)
Chloride: 101 mmol/L (ref 96–106)
Creatinine, Ser: 0.81 mg/dL (ref 0.57–1.00)
Globulin, Total: 3 g/dL (ref 1.5–4.5)
Glucose: 94 mg/dL (ref 65–99)
Potassium: 4 mmol/L (ref 3.5–5.2)
Sodium: 142 mmol/L (ref 134–144)
Total Protein: 6.9 g/dL (ref 6.0–8.5)
eGFR: 87 mL/min/{1.73_m2} (ref >59)

## 2021-05-24 LAB — FERRITIN: Ferritin: 14 ng/mL — ABNORMAL LOW (ref 15–150)

## 2021-06-06 LAB — HM DIABETES EYE EXAM

## 2021-08-02 ENCOUNTER — Other Ambulatory Visit: Payer: Self-pay | Admitting: Family Medicine

## 2021-08-02 DIAGNOSIS — I10 Essential (primary) hypertension: Secondary | ICD-10-CM

## 2021-10-29 ENCOUNTER — Encounter: Payer: Self-pay | Admitting: Medical

## 2021-10-29 ENCOUNTER — Other Ambulatory Visit: Payer: Self-pay

## 2021-10-29 ENCOUNTER — Ambulatory Visit: Payer: BC Managed Care – PPO | Admitting: Medical

## 2021-10-29 VITALS — BP 124/80 | HR 78 | Temp 98.0°F | Wt 261.2 lb

## 2021-10-29 DIAGNOSIS — N6321 Unspecified lump in the left breast, upper outer quadrant: Secondary | ICD-10-CM | POA: Diagnosis not present

## 2021-10-29 NOTE — Progress Notes (Signed)
Subjective:  Crystal [name] is a 56 y.o. female who presents for Chief Complaint  Patient presents with   other    Lump on lt. Breast doing PT not sure if she has pulled something or not.      Here for complaint of lump of left breast.  Found a knot yesterday.    She is in physical therapy for her back, had back surgery 02/2021.  Been using heating pad.   Yesterday just happened to be checking breast and felt a knot.    She has hx/o left breast nodule and prior biopsy.  She does check breasts monthly but this is a new findings.     Knot is not painful, but a little tender to touch.  No skin dimpling.  No lump in armpit on left.     2 aunts in her family had breast cancer, maternal side.    No other aggravating or relieving factors.    No other c/o.  Past Medical History:  Diagnosis Date   ADHD (attention deficit hyperactivity disorder)    Anemia    Axillary mass, right 2019   Ultrasound - lipoma    Back pain    Blood transfusion without reported diagnosis    1993 - laparoscopic cholecystectomy with re-exploration with laparotomy next day due to bleeding.   Diabetes mellitus    AODM   Diverticulosis 04/2017   noted on colonoscopy   History of gallstones    Hypertension    Impaired fasting glucose    Internal hemorrhoids 04/2017   noted on colonoscopy   Obesity    Unspecified vitamin D deficiency 05/2013   Family History  Problem Relation Age of Onset   Depression Mother    Hypertension Mother    Hyperlipidemia Mother    Other Mother        colonic stricture   Cancer Mother        uterine sarcoma   Early death Father    Heart disease Father 40   Thyroid cancer Maternal Aunt    Breast cancer Maternal Aunt 72   Cancer Maternal Aunt        breast and thyroid   Breast cancer Maternal Aunt 64   Cancer Maternal Aunt    Pancreatic cancer Maternal Grandmother    Cancer Maternal Grandmother    Thyroid cancer Other    Diabetes Neg Hx    Colon cancer Neg Hx     The  following portions of the patient's history were reviewed and updated as appropriate: allergies, current medications, past family history, past medical history, past social history, past surgical history and problem list.  ROS Otherwise as in subjective above  Objective: BP 124/80    Pulse 78    Temp 98 F (36.7 C)    Wt 261 lb 3.2 oz (118.5 kg)    BMI 44.14 kg/m   General appearance: alert, no distress, well developed, well nourished Left breast upper outer quadrant almost into the axilla with a 2 cm x 1.5 cm lump that seems to run linear towards the axilla in the 2 o'clock position.  It is not round and mobile, texture seems almost fatty tissue versus fibrous.  No other obvious nodules or lymphadenopathy of either breast.  No lymph nodes palpated in the supraclavicular or axillary area.  Exam chaperoned by nurse   Assessment: Encounter Diagnosis  Name Primary?   Mass of upper outer quadrant of left breast Yes     Plan: Plan  for imaging as below.  Continue self breast exams regularly.  I reviewed the imaging she had last year 11/2020.  Crystal [name] was seen today for other.  Diagnoses and all orders for this visit:  Mass of upper outer quadrant of left breast -     MM Digital Diagnostic Unilat L; Future    Follow up: Pending studies

## 2021-10-30 ENCOUNTER — Telehealth: Payer: Self-pay

## 2021-10-30 ENCOUNTER — Other Ambulatory Visit: Payer: Self-pay | Admitting: Medical

## 2021-10-30 DIAGNOSIS — N6320 Unspecified lump in the left breast, unspecified quadrant: Secondary | ICD-10-CM

## 2021-10-30 NOTE — Telephone Encounter (Signed)
Pt. Called wanting to know if you got the message about her diagnostic breast imaging. She saw [facility] yesterday and he was going to let you know about it. If you could call her back about it.

## 2021-10-30 NOTE — Telephone Encounter (Signed)
Got patient scheduled for 11/20/21 @ 8:20am. I called and left her a detailed message with this information. I also let her know that she is able to call [phone] each day, possibly around 10:00 and 3pm, (this is what their scheduler recommended) to try to get a cancellation to get in sooner. I did assure her that almost all of our patients that do this do get in with a week or so and to please call me with any questions.

## 2021-11-05 ENCOUNTER — Ambulatory Visit
Admission: RE | Admit: 2021-11-05 | Discharge: 2021-11-05 | Disposition: A | Discharge status: Home / Self Care | Payer: BC Managed Care – PPO | Source: Ambulatory Visit | Attending: Medical | Admitting: Medical

## 2021-11-05 ENCOUNTER — Other Ambulatory Visit: Payer: Self-pay | Admitting: Medical

## 2021-11-05 DIAGNOSIS — N6321 Unspecified lump in the left breast, upper outer quadrant: Secondary | ICD-10-CM

## 2021-11-05 DIAGNOSIS — N6320 Unspecified lump in the left breast, unspecified quadrant: Secondary | ICD-10-CM

## 2021-11-14 ENCOUNTER — Other Ambulatory Visit: Payer: Self-pay

## 2021-11-14 ENCOUNTER — Encounter: Payer: Self-pay | Admitting: Obstetrics and Gynecology

## 2021-11-14 ENCOUNTER — Ambulatory Visit: Payer: BC Managed Care – PPO | Admitting: Obstetrics and Gynecology

## 2021-11-14 VITALS — BP 140/86 | HR 88 | Resp 16 | Ht 64.0 in | Wt 277.0 lb

## 2021-11-14 DIAGNOSIS — N750 Cyst of Bartholin's gland: Secondary | ICD-10-CM
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
## 2021-11-14 NOTE — Patient Instructions (Signed)
Bartholin's Cyst A Bartholin's cyst is a fluid-filled sac that forms as a result of a blockage along the tube (duct) of the Bartholin's gland. Bartholin's glands are small glands in the folds of skin around the vaginal opening (labia). These glands produce fluid to moisten or lubricate the outside of the vagina during sex. A cyst that is not large or infected may not cause any problems or require treatment. If the cyst gets infected with bacteria, it is called a Bartholin's abscess. An abscess may cause symptoms such as pain and swelling and is more likely to require treatment. What are the causes? This condition may be caused by a blocked Bartholin's gland duct. These ducts can become blocked due to natural buildup of fluid and oils. Bacteria inside of the cyst can cause infection. In many cases, the cause is not known. What are the signs or symptoms? Symptoms may include: A bulge or lump on the labia, near the lower opening of the vagina. Discomfort or pain. This may get worse during sex or when walking. Redness, swelling, or fluid draining from the area. These may be signs of an abscess. How severe your symptoms are depends on the size of your cyst and whether it is infected. Infection causes symptoms to get more severe. How is this diagnosed? This condition may be diagnosed based on: Your symptoms and medical history. A physical exam to check for swelling in your vaginal area. You may lie on your back on an exam table and have your feet placed into footrests for the exam. Blood tests to check for infections. Removal of a fluid sample from the cyst or abscess (biopsy) for testing. You may work with a health care provider who specializes in [employer] health (gynecologist) for diagnosis and treatment. How is this treated? If your cyst is small, not infected, and not causing symptoms, you may not need treatment. These cysts often go away on their own, with home care such as hot baths or warm  compresses. If you have a large cyst or an abscess, treatment may include: Antibiotic medicine. A procedure to drain the fluid inside the cyst or abscess. These procedures involve making an incision in the cyst or abscess so that the fluid drains out, and then one of the following may be done: A small, thin tube (catheter) may be placed inside the cyst or abscess so that it does not close and fill up with fluid again (fistulization). The catheter will be removed at a follow-up visit. The edges of the incision may be stitched to your skin so that the cyst or abscess stays open (marsupialization). This allows it to continue to drain and not fill up with fluid again. If you have cysts or abscesses that keep returning (recurring) and have required incision and drainage multiple times, your health care provider may talk with you about surgery to remove the Bartholin's gland. Follow these instructions at home: Medicines Take over-the-counter and prescription medicines only as told by your health care provider. If you were prescribed an antibiotic medicine, take it as told by your health care provider. Do not stop taking the antibiotic even if your condition improves. Managing pain and swelling Try sitz baths to help with pain and swelling. A sitz bath is a warm water bath in which the water only comes up to your hips and should cover your buttocks. You may take sitz baths several times a day. Apply heat to the affected area as often as needed. Use the heat source  that your health care provider recommends, such as a moist heat pack or a heating pad. Place a towel between your skin and the heat source. Leave the heat on for 20-30 minutes. Remove the heat if your skin turns bright red. This is especially important if you are unable to feel pain, heat, or cold. You may have a greater risk of getting burned. Do not fall asleep with the heating pad in place. General instructions If your cyst or abscess was  drained, follow instructions from your health care provider about how to take care of your wound. Use feminine pads as needed to absorb any drainage. Do not push on or squeeze your cyst. Do not have sex until the cyst has gone away or your wound from drainage has healed. Take these steps to help prevent a Bartholin's cyst from returning and to prevent other Bartholin's cysts from developing: Take a bath or shower once a day. Clean your vaginal area with mild soap and water when you bathe. Practice safe sex to prevent STIs. Talk with your health care provider about how to prevent STIs and which forms of birth control (contraception) may be best for you. Keep all follow-up visits. This is important. Contact a health care provider if: You have a fever. You develop increasing redness, swelling, or pain around your cyst. You have fluid, blood, pus, or a bad smell coming from your cyst. You have a cyst that gets larger or comes back. Summary A Bartholin's cyst is a fluid-filled sac that forms as a result of a blockage along the duct of the Bartholin's gland. If your cyst is small, not infected, and not causing symptoms, you may not need any treatment. If you have a large cyst or an abscess, your health care provider may perform a procedure to drain the fluid. If you have cysts or abscesses that keep returning (recurring) and have required incision and drainage multiple times, your health care provider may talk with you about surgery to remove the Bartholin's gland. This information is not intended to replace advice given to you by your health care provider. Make sure you discuss any questions you have with your health care provider. Document Revised: 02/06/2020 Document Reviewed: 02/06/2020 Elsevier Patient Education  [place].

## 2021-11-20 ENCOUNTER — Other Ambulatory Visit: Payer: BC Managed Care – PPO

## 2021-11-24 NOTE — Progress Notes (Signed)
Chief Complaint  Patient presents with   Diabetes    Fasting med check. Patient did see Dr. Ninfa Linden but did not schedule thyroid surgery. She is not yet back to work. She started taking her adderall again regularly about two weeks ago as she thought she would be back to work, she does need a refill. She is scheduled for this Thurs for ablation (back) with Dr. Lynann Bologna, she wants to discuss with you.     She recently saw her GYN with bartholin gland cyst. This is improving on its own.  She saw Audelia Acton last month with L breast mass.  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 No mammographic or ultrasound evidence for malignancy.  LBP related to [place] injury 02/2020.  She is s/p surgery by Dr. Lynann Bologna in 02/2021, last seen by ortho 11/01/21.  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  She had bilat L5-S1 facet joint injection 10/17/21 but only got short-lived response.  She no longer has muscle spasms, but has continued low back pain. She continues in PT.  Plan is to f/u with Dr. Mina Marble to discuss possible RF ablation procedure.  She is scheduled for the testing on Thursday. She is hesitant about doing the procedure and has many questions about this today. She is now up to walking 1.5 miles (at a slower rate, but is improving daily). She is walking in the water and does some water aerobics exercises at the pool at the [facility]. She hadn't been back to work yet.  She has "good days and bad days"--related to pain, and also moods (feeling like she should be further along than she is).  Follow-up on diabetes. She continues on Metformin '2000mg'$  daily (taken with dinner), and tolerates this without side effects.  She admits that her diet hasn't been as good recently. She is back to drinking regular sodas (mini ones, usually), and snacking more frequently, "because I'm not doing anything, I'm just at home". She hasn't been  checking her sugars (machine broke), not since January. She previously did well on Trulicity, but this was stopped due to affordability of the copay.  We had increased her metformin dose, and A1c's remained good.  She now states that she has some extra income since she paid her car off.  She is considering restarting Trulicity, since that also helped with appetite and weight.  Denies hypoglycemia, polydipsia, polyuria. She denies numbness/tingling/burning or sores in her feet. Last diabetic eye exam was 05/2021   Lab Results  Component Value Date   HGBA1C 6.9 (A) 05/23/2021    Left thyroid nodule--she underwent L thyroid biopsy in 10/2020, results were inconclusive, insufficient cells.   Options given to pt included re-try in 3 mos, vs surgical excision of L thyroid, vs recheck [place]. She was referred to Dr. Ninfa Linden, and planned for surgery (delayed until after back surgery). She did not proceed with surgery. Denies any symptoms related to the nodule. She thinks it might be smaller (as Dr. Quincy Simmonds and I have both mentioned that).     Hypertension-- She reports compliance with her medications (losartan-HCT), denies side effects.   BP's have been running 117-120/80 at home. Denies headaches, dizziness, chest pain, muscle cramps    Hyperlipidemia: She is tolerating daily Crestor without myalgias (previously didn't tolerate atorvastatin due to myalgias).  Lipids were at goal on last check. Lab Results  Component Value Date   CHOL 111 01/16/2021  HDL 31 (L) 01/16/2021   LDLCALC 60 01/16/2021   TRIG 108 01/16/2021   CHOLHDL 3.6 01/16/2021    ADHD: She had been taking the Adderall once daily, during the week only, when working. She stopped taking it when out of work.  She has since restarted it, about 2 weeks ago, needs a refill. It is effective and she denies side effects.  She realizes now that she needs it, even when not working (but once daily is enough).   H/o vitamin D deficiency--low in  2016-2017, normal since 2018, when taking 2000 IU daily. Only stopped this prior to her surgery in June, restarted it 6 months ago.  She is taking 2000 IU daily.  Iron deficiency anemia: She is s/p EGD and Colonoscopy in August, 2018 (reactive gastropathy, inflammation in stomach, internal hemorrhoids and diverticulosis on colonoscopy). Gastroenterologist did not see a GI source for iron deficiency, suspected related to menstrual losses.  She does have fibroids. She is under the care of Dr. Quincy Simmonds.   Cycles are lighter than they had been. At her last visit she reported cycles were still heavy (ut lighter than previously).She now has more spotting rather full cycles, just since December.  At her physical she reported not taking any iron since her back surgery.  Hemoglobin and ferritin were low again. She is currently taking iron every other day. And cycles are much lighter. Denies fatigue or dizziness, denies hot flashes or night sweats.   Lab Results  Component Value Date   WBC 5.8 05/23/2021   HGB 9.7 (L) 05/23/2021   HCT 31.6 (L) 05/23/2021   MCV 79 05/23/2021   PLT 323 05/23/2021   Lab Results  Component Value Date   FERRITIN 14 (L) 05/23/2021       PMH, PSH, SH reviewed  Outpatient Encounter Medications as of 11/25/2021  Medication Sig   acetaminophen (TYLENOL) 500 MG tablet Take 1,000 mg by mouth every 6 (six) hours as needed for moderate pain.   amphetamine-dextroamphetamine (ADDERALL) 20 MG tablet Take 1 tablet (20 mg total) by mouth 2 (two) times daily.   Cholecalciferol (VITAMIN D) 2000 units tablet Take 2,000 Units by mouth daily.   Ferrous Sulfate (IRON) 325 (65 FE) MG TABS Take 325 mg by mouth daily.   fluticasone (FLONASE) 50 MCG/ACT nasal spray SPRAY 2 SPRAYS INTO EACH NOSTRIL EVERY DAY   Lancets (ONETOUCH DELICA PLUS [id number]) MISC CHECK GLUCOSE ONCE DAILY   losartan-hydrochlorothiazide (HYZAAR) 100-25 MG tablet TAKE 1 TABLET BY MOUTH EVERY DAY   metFORMIN  (GLUCOPHAGE-XR) 500 MG 24 hr tablet TAKE 4 TABLETS BY MOUTH EVERY DAY WITH DINNER   Multiple Vitamins-Minerals (MULTIVITAMIN WITH MINERALS) tablet Take 1 tablet by mouth daily. Reported on 01/02/2016   ONETOUCH VERIO test strip GLUCOSE CHECK ONCE DAILY   rosuvastatin (CRESTOR) 5 MG tablet Take 1 tablet (5 mg total) by mouth daily.   triamcinolone cream (KENALOG) 0.1 % Apply 1 application topically 2 (two) times daily as needed (rash).   [DISCONTINUED] methocarbamol (ROBAXIN) 500 MG tablet Take 1 tablet (500 mg total) by mouth every 6 (six) hours as needed for muscle spasms.   [DISCONTINUED] 0.9 %  sodium chloride infusion    No facility-administered encounter medications on file as of 11/25/2021.   Allergies  Allergen Reactions   Onglyza [Saxagliptin] Other (See Comments)    Eye irritation (when on Kombiglyze; tolerates metformin without problems)  Patient denies allergy to this product. States that it was another medication    ROS: no  fever, chills, headaches, dizziness, URI symptoms, chest pain, shortness of breath, GI or GU complaints.  No myalgias. No bleeding, bruising, rashes Back pain per HPI Moods/depression overall improved, has some days better than others (more related to her pain, and not being back to work). Some sinus headaches, is out of flonase, needs refill.   PHYSICAL EXAM:  BP 134/84    Pulse 68    Ht '5\' 4"'$  (1.626 m)    Wt 261 lb 6.4 oz (118.6 kg)    BMI 44.87 kg/m   Wt Readings from Last 3 Encounters:  11/25/21 261 lb 6.4 oz (118.6 kg)  11/14/21 277 lb (125.6 kg)  10/29/21 261 lb 3.2 oz (118.5 kg)   Pleasant, well-appearing female in no distress HEENT: conjunctiva and sclera are clear, EOMI. Wearing mask Neck: no lymphadenopathy.  Difficult to appreciate any thyroid nodule today. Heart: regular rate and rhythm, no murmur Lungs: clear bilaterally Back: no spinal or CVA tenderness. No muscle spasm. Abdomen: soft, obese, nontender Extremities: no edema Skin:  normal turgor, no visible rash Psych: normal mood, affect, hygiene and grooming. Neuro: alert and oriented, normal gait  Lab Results  Component Value Date   HGBA1C 7.8 (A) 11/25/2021     ASSESSMENT/PLAN:  Type 2 diabetes mellitus with other specified complication, without long-term current use of insulin (HCC) - A1c above goal related to poor diet. Reviewed proper diet. Disc Trulicity/Mounjaro, prefers Mounjaro. Cont metformin, lower when glu better - Plan: HgB A1c, metFORMIN (GLUCOPHAGE-XR) 500 MG 24 hr tablet, tirzepatide (MOUNJARO) 2.5 MG/0.5ML Pen, tirzepatide (MOUNJARO) 5 MG/0.5ML Pen  Hypertension associated with diabetes ([place]) - borderline in office, good at home. Cont current meds. Daily exercise and weight loss, low Na diet  Dyslipidemia associated with type 2 diabetes mellitus ([place]) - cont rosuvastatin - Plan: Lipid panel  Attention deficit disorder, unspecified hyperactivity presence - controlled - Plan: amphetamine-dextroamphetamine (ADDERALL) 20 MG tablet  Thyroid nodule - recheck [place] to re-eval nodule, to determine if further eval is needed - Plan: US THYROID  Iron deficiency anemia, unspecified iron deficiency anemia type - taking iron qod and menses much lighter x 3 mos. Recheck - Plan: CBC with Differential/Platelet, Ferritin  Allergic rhinitis - discussed antihistamines, flonase - Plan: fluticasone (FLONASE) 50 MCG/ACT nasal spray  Discussed restarting Trulicity (which she prev took and tolerated) vs Mounjaro, prefers Mounjaro. Given copay card and rx's sent to pharm. To start on 2.'5mg'$  dose x 4 weeks '5mg'$  x 4 weeks; contact us at 8 week-mark to see if she wants to go up to 7.5 vs cont at '5mg'$ .  Counseled in detail re: healthy diet, things to "occupy" her time. She plans to get out of the house more, but also discussed food planning, healthier cooking. Discussed RF ablation, answered her questions to the best that I could and discussed what she should speak to her doctor  about. All questions were answered.  F/u med check in 3 mos.   I spent 55 minutes dedicated to the care of this patient, including pre-visit review of records, face to face time, post-visit ordering of testing and documentation.

## 2021-11-25 ENCOUNTER — Encounter: Payer: Self-pay | Admitting: Family Medicine

## 2021-11-25 ENCOUNTER — Ambulatory Visit: Payer: BC Managed Care – PPO | Admitting: Family Medicine

## 2021-11-25 ENCOUNTER — Other Ambulatory Visit: Payer: Self-pay

## 2021-11-25 VITALS — BP 134/84 | HR 68 | Ht 64.0 in | Wt 261.4 lb

## 2021-11-25 DIAGNOSIS — D509 Iron deficiency anemia, unspecified: Secondary | ICD-10-CM

## 2021-11-25 DIAGNOSIS — E785 Hyperlipidemia, unspecified: Secondary | ICD-10-CM

## 2021-11-25 DIAGNOSIS — F988 Other specified behavioral and emotional disorders with onset usually occurring in childhood and adolescence: Secondary | ICD-10-CM

## 2021-11-25 DIAGNOSIS — J309 Allergic rhinitis, unspecified: Secondary | ICD-10-CM

## 2021-11-25 DIAGNOSIS — E1159 Type 2 diabetes mellitus with other circulatory complications: Secondary | ICD-10-CM | POA: Diagnosis not present

## 2021-11-25 DIAGNOSIS — E041 Nontoxic single thyroid nodule: Secondary | ICD-10-CM

## 2021-11-25 DIAGNOSIS — E1169 Type 2 diabetes mellitus with other specified complication: Secondary | ICD-10-CM

## 2021-11-25 DIAGNOSIS — I152 Hypertension secondary to endocrine disorders: Secondary | ICD-10-CM

## 2021-11-25 LAB — POCT GLYCOSYLATED HEMOGLOBIN (HGB A1C): Hemoglobin A1C: 7.8 % — AB (ref 4.0–5.6)

## 2021-11-25 MED ORDER — METFORMIN HCL ER 500 MG PO TB24: ORAL_TABLET | ORAL | 0 refills | Status: DC

## 2021-11-25 MED ORDER — TIRZEPATIDE 5 MG/0.5ML ~~LOC~~ SOAJ: 5.0000 mg | SUBCUTANEOUS | 0 refills | Status: DC

## 2021-11-25 MED ORDER — AMPHETAMINE-DEXTROAMPHETAMINE 20 MG PO TABS: 20.0000 mg | ORAL_TABLET | Freq: Two times a day (BID) | ORAL | 0 refills | Status: DC

## 2021-11-25 MED ORDER — TIRZEPATIDE 2.5 MG/0.5ML ~~LOC~~ SOAJ: 2.5000 mg | SUBCUTANEOUS | 0 refills | Status: DC

## 2021-11-25 MED ORDER — FLUTICASONE PROPIONATE 50 MCG/ACT NA SUSP: NASAL | 3 refills | Status: AC

## 2021-11-25 NOTE — Patient Instructions (Addendum)
Please get a new meter so that you can monitor your blood sugars (and see the consequences of poor choices, as well as see the impact of the new medications, to know when you can cut back the metformin dose to just 2/day.  You can do this when you see sugars under 110). ? ?Check sugars twice daily (or at least vary the time of day, and check once a day, but different times of day). ?Keep a log, with columns for morning (fasting) vs for after meals. ?(Date/fasting/after meals/comments are the columns for your chart). ?Bring this to your next visit. ? ?We will be starting you on Mounjaro once a week.  Stay at the 2.'5mg'$  for 4 weeks, then increase to '5mg'$  for 4 weeks. ?Contact us after 2 months to let us know how you're doing, and if you're ready to increase further to the 7.'5mg'$  dose (if tolerating it well), vs stay at the '5mg'$  dose for another month until your visit. ? ?Please stop the sodas, work on healthier snacks, and smaller portions. ?Keep up the good work with your exercise! ? ?We are putting an order for you to get a follow-up thyroid ultrasound. If the nodule is the same or larger, you should see Dr. Ninfa Linden.  ?

## 2021-11-26 LAB — FERRITIN: Ferritin: 31 ng/mL (ref 15–150)

## 2021-11-26 LAB — CBC WITH DIFFERENTIAL/PLATELET
Basophils Absolute: 0 10*3/uL (ref 0.0–0.2)
Basos: 0 %
EOS (ABSOLUTE): 0.2 10*3/uL (ref 0.0–0.4)
Eos: 3 %
Hematocrit: 39.4 % (ref 34.0–46.6)
Hemoglobin: 12.4 g/dL (ref 11.1–15.9)
Immature Grans (Abs): 0 10*3/uL (ref 0.0–0.1)
Immature Granulocytes: 0 %
Lymphocytes Absolute: 1.7 10*3/uL (ref 0.7–3.1)
Lymphs: 30 %
MCH: 25.7 pg — ABNORMAL LOW (ref 26.6–33.0)
MCHC: 31.5 g/dL (ref 31.5–35.7)
MCV: 82 fL (ref 79–97)
Monocytes Absolute: 0.4 10*3/uL (ref 0.1–0.9)
Monocytes: 6 %
Neutrophils Absolute: 3.3 10*3/uL (ref 1.4–7.0)
Neutrophils: 61 %
Platelets: 268 10*3/uL (ref 150–450)
RBC: 4.82 x10E6/uL (ref 3.77–5.28)
RDW: 15.1 % (ref 11.7–15.4)
WBC: 5.5 10*3/uL (ref 3.4–10.8)

## 2021-11-26 LAB — LIPID PANEL
Chol/HDL Ratio: 3.3 ratio (ref 0.0–4.4)
Cholesterol, Total: 108 mg/dL (ref 100–199)
HDL: 33 mg/dL — ABNORMAL LOW (ref >39)
LDL Chol Calc (NIH): 57 mg/dL (ref 0–99)
Triglycerides: 95 mg/dL (ref 0–149)
VLDL Cholesterol Cal: 18 mg/dL (ref 5–40)

## 2021-11-26 MED ORDER — ROSUVASTATIN CALCIUM 5 MG PO TABS: 5.0000 mg | ORAL_TABLET | Freq: Every day | ORAL | 3 refills | Status: DC

## 2021-12-04 ENCOUNTER — Ambulatory Visit
Admission: RE | Admit: 2021-12-04 | Discharge: 2021-12-04 | Disposition: A | Discharge status: Home / Self Care | Payer: BC Managed Care – PPO | Source: Ambulatory Visit | Attending: Family Medicine | Admitting: Family Medicine

## 2021-12-04 DIAGNOSIS — E041 Nontoxic single thyroid nodule: Secondary | ICD-10-CM

## 2021-12-23 ENCOUNTER — Encounter: Payer: Self-pay | Admitting: Family Medicine

## 2021-12-23 NOTE — Telephone Encounter (Signed)
Please check with her to ensure that she used appropriate coupons (the $25 copay makes sense but that initial cost seems very high!) ?If there's no way to get the initial cost down, then okay to switch back to Trulicity (she previously took that). ? ?
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
## 2021-12-25 ENCOUNTER — Other Ambulatory Visit: Payer: Self-pay | Admitting: *Deleted

## 2021-12-25 MED ORDER — TRULICITY 0.75 MG/0.5ML ~~LOC~~ SOAJ: 0.7500 mg | SUBCUTANEOUS | 2 refills | Status: DC

## 2022-01-03 ENCOUNTER — Ambulatory Visit (INDEPENDENT_AMBULATORY_CARE_PROVIDER_SITE_OTHER): Payer: BC Managed Care – PPO | Admitting: Obstetrics and Gynecology

## 2022-01-03 ENCOUNTER — Other Ambulatory Visit (HOSPITAL_COMMUNITY)
Admission: RE | Admit: 2022-01-03 | Discharge: 2022-01-03 | Disposition: A | Discharge status: Home / Self Care | Payer: BC Managed Care – PPO | Source: Ambulatory Visit | Attending: Obstetrics and Gynecology | Admitting: Obstetrics and Gynecology

## 2022-01-03 ENCOUNTER — Encounter: Payer: Self-pay | Admitting: Obstetrics and Gynecology

## 2022-01-03 VITALS — BP 140/82 | HR 75 | Ht 64.0 in | Wt 266.0 lb

## 2022-01-03 DIAGNOSIS — Z119 Encounter for screening for infectious and parasitic diseases, unspecified: Secondary | ICD-10-CM | POA: Diagnosis present

## 2022-01-03 DIAGNOSIS — N760 Acute vaginitis: Secondary | ICD-10-CM

## 2022-01-03 DIAGNOSIS — Z01419 Encounter for gynecological examination (general) (routine) without abnormal findings: Secondary | ICD-10-CM

## 2022-01-03 DIAGNOSIS — Z113 Encounter for screening for infections with a predominantly sexual mode of transmission: Secondary | ICD-10-CM

## 2022-01-03 NOTE — Patient Instructions (Signed)
EXERCISE AND DIET:  We recommended that you start or continue a regular exercise program for good health. Regular exercise means any activity that makes your heart beat faster and makes you sweat.  We recommend exercising at least 30 minutes per day at least 3 days a week, preferably 4 or 5.  We also recommend a diet low in fat and sugar.  Inactivity, poor dietary choices and obesity can cause diabetes, heart attack, stroke, and kidney damage, among others.   ? ?ALCOHOL AND SMOKING:  Women should limit their alcohol intake to no more than 7 drinks/beers/glasses of wine (combined, not each!) per week. Moderation of alcohol intake to this level decreases your risk of breast cancer and liver damage. And of course, no recreational drugs are part of a healthy lifestyle.  And absolutely no smoking or even second hand smoke. Most people know smoking can cause heart and lung diseases, but did you know it also contributes to weakening of your bones? Aging of your skin?  Yellowing of your teeth and nails? ? ?CALCIUM AND VITAMIN D:  Adequate intake of calcium and Vitamin D are recommended.  The recommendations for exact amounts of these supplements seem to change often, but generally speaking 600 mg of calcium (either carbonate or citrate) and 800 units of Vitamin D per day seems prudent. Certain women may benefit from higher intake of Vitamin D.  If you are among these women, your doctor will have told you during your visit.   ? ?PAP SMEARS:  Pap smears, to check for cervical cancer or precancers,  have traditionally been done yearly, although recent scientific advances have shown that most women can have pap smears less often.  However, every woman still should have a physical exam from her gynecologist every year. It will include a breast check, inspection of the vulva and vagina to check for abnormal growths or skin changes, a visual exam of the cervix, and then an exam to evaluate the size and shape of the uterus and  ovaries.  And after 54 years of age, a rectal exam is indicated to check for rectal cancers. We will also provide age appropriate advice regarding health maintenance, like when you should have certain vaccines, screening for sexually transmitted diseases, bone density testing, colonoscopy, mammograms, etc.  ? ?MAMMOGRAMS:  All women over 45 years old should have a yearly mammogram. Many facilities now offer a "3D" mammogram, which may cost around $50 extra out of pocket. If possible,  we recommend you accept the option to have the 3D mammogram performed.  It both reduces the number of women who will be called back for extra views which then turn out to be normal, and it is better than the routine mammogram at detecting truly abnormal areas.   ? ?COLONOSCOPY:  Colonoscopy to screen for colon cancer is recommended for all women at age 18.  We know, you hate the idea of the prep.  We agree, BUT, having colon cancer and not knowing it is worse!!  Colon cancer so often starts as a polyp that can be seen and removed at colonscopy, which can quite literally save your life!  And if your first colonoscopy is normal and you have no family history of colon cancer, most women don't have to have it again for 10 years.  Once every ten years, you can do something that may end up saving your life, right?  We will be happy to help you get it scheduled when you are ready.  Be sure to check your insurance coverage so you understand how much it will cost.  It may be covered as a preventative service at no cost, but you should check your particular policy.   ? ?Calcium Content in Foods ?Calcium is the most abundant mineral in the body. Most of the body's calcium supply is stored in bones and teeth. Calcium helps many parts of the body function normally, including: ?Blood and blood vessels. ?Nerves. ?Hormones. ?Muscles. ?Bones and teeth. ?When your calcium stores are low, you may be at risk for low bone mass, bone loss, and broken bones  (fractures). When you get enough calcium, it helps to support strong bones and teeth throughout your life. ?Calcium is especially important for: ?Children during growth spurts. ?Girls during adolescence. ?Women who are pregnant or breastfeeding. ?Women after their menstrual cycle stops (postmenopause). ?Women whose menstrual cycle has stopped due to anorexia nervosa or regular intense exercise. ?People who cannot eat or digest dairy products. ?Vegans. ?Recommended daily amounts of calcium: ?Women (ages 57 to 15): 1,000 mg per day. ?Women (ages 83 and older): 1,200 mg per day. ?Men (ages 9 to 57): 1,000 mg per day. ?Men (ages 63 and older): 1,200 mg per day. ?Women (ages 87 to 81): 1,300 mg per day. ?Men (ages 22 to 21): 1,300 mg per day. ?General information ?Eat foods that are high in calcium. Try to get most of your calcium from food. ?Some people may benefit from taking calcium supplements. Check with your health care provider or diet and nutrition specialist (dietitian) before starting any calcium supplements. Calcium supplements may interact with certain medicines. Too much calcium may cause other health problems, such as constipation and kidney stones. ?For the body to absorb calcium, it needs vitamin D. Sources of vitamin D include: ?Skin exposure to direct sunlight. ?Foods, such as egg yolks, liver, mushrooms, saltwater fish, and fortified milk. ?Vitamin D supplements. Check with your health care provider or dietitian before starting any vitamin D supplements. ?What foods are high in calcium? ? ?Foods that are high in calcium contain more than 100 milligrams per serving. ?Fruits ?Fortified orange juice or other fruit juice, 300 mg per 8 oz serving. ?Vegetables ?Collard greens, 360 mg per 8 oz serving. ?Kale, 100 mg per 8 oz serving. ?Bok choy, 160 mg per 8 oz serving. ?Grains ?Fortified ready-to-eat cereals, 100 to 1,000 mg per 8 oz serving. ?Fortified frozen waffles, 200 mg in 2 waffles. ?Oatmeal, 140 mg in  1 cup. ?Meats and other proteins ?Sardines, canned with bones, 325 mg per 3 oz serving. ?Salmon, canned with bones, 180 mg per 3 oz serving. ?Canned shrimp, 125 mg per 3 oz serving. ?Baked beans, 160 mg per 4 oz serving. ?Tofu, firm, made with calcium sulfate, 253 mg per 4 oz serving. ?Dairy ?Yogurt, plain, low-fat, 310 mg per 6 oz serving. ?Nonfat milk, 300 mg per 8 oz serving. ?American cheese, 195 mg per 1 oz serving. ?Cheddar cheese, 205 mg per 1 oz serving. ?Cottage cheese 2%, 105 mg per 4 oz serving. ?Fortified soy, rice, or almond milk, 300 mg per 8 oz serving. ?Mozzarella, part skim, 210 mg per 1 oz serving. ?The items listed above may not be a complete list of foods high in calcium. Actual amounts of calcium may be different depending on processing. Contact a dietitian for more information. ?What foods are lower in calcium? ?Foods that are lower in calcium contain 50 mg or less per serving. ?Fruits ?Apple, about 6 mg. ?Banana, about 12 mg. ?  Vegetables ?Lettuce, 19 mg per 2 oz serving. ?Tomato, about 11 mg. ?Grains ?Rice, 4 mg per 6 oz serving. ?Boiled potatoes, 14 mg per 8 oz serving. ?White bread, 6 mg per slice. ?Meats and other proteins ?Egg, 27 mg per 2 oz serving. ?Red meat, 7 mg per 4 oz serving. ?Chicken, 17 mg per 4 oz serving. ?Fish, cod, or trout, 20 mg per 4 oz serving. ?Dairy ?Cream cheese, regular, 14 mg per 1 Tbsp serving. ?Brie cheese, 50 mg per 1 oz serving. ?Parmesan cheese, 70 mg per 1 Tbsp serving. ?The items listed above may not be a complete list of foods lower in calcium. Actual amounts of calcium may be different depending on processing. Contact a dietitian for more information. ?Summary ?Calcium is an important mineral in the body because it affects many functions. Getting enough calcium helps support strong bones and teeth throughout your life. ?Try to get most of your calcium from food. ?Calcium supplements may interact with certain medicines. Check with your health care provider  or dietitian before starting any calcium supplements. ?This information is not intended to replace advice given to you by your health care provider. Make sure you discuss any questions you have with your h

## 2022-01-06 LAB — RPR: RPR Ser Ql: NONREACTIVE

## 2022-01-06 LAB — HIV ANTIBODY (ROUTINE TESTING W REFLEX): HIV 1&2 Ab, 4th Generation: NONREACTIVE

## 2022-01-06 LAB — HEPATITIS C ANTIBODY
Hepatitis C Ab: NONREACTIVE
SIGNAL TO CUT-OFF: <0.02 (ref <1.00)

## 2022-01-07 LAB — CERVICOVAGINAL ANCILLARY ONLY
Bacterial Vaginitis (gardnerella): NEGATIVE
Candida Glabrata: NEGATIVE
Candida Vaginitis: NEGATIVE
Chlamydia: NEGATIVE
Comment: NEGATIVE
Comment: NEGATIVE
Comment: NEGATIVE
Comment: NEGATIVE
Comment: NEGATIVE
Comment: NORMAL
Neisseria Gonorrhea: NEGATIVE
Trichomonas: NEGATIVE

## 2022-02-06 HISTORY — PX: RADIOFREQUENCY ABLATION NERVES: SUR1070

## 2022-02-22 ENCOUNTER — Other Ambulatory Visit: Payer: Self-pay | Admitting: Family Medicine

## 2022-02-22 DIAGNOSIS — I10 Essential (primary) hypertension: Secondary | ICD-10-CM

## 2022-03-02 NOTE — Progress Notes (Unsigned)
  No chief complaint on file.   Patient presents to f/u on her diabetes. Her A1c was up to 7.8% at her visit in March. Since that time she was switched back to Trulicity (wanted to switch to [facility], but initial out of pocket expense was too much). She is currently on the 0.'75mg'$  dose of Trulicity, and is tolerating this without side effects. She continues on Metformin '2000mg'$  daily (taken with dinner), and tolerates this without side effects.   At her visitin March, she admitted that she was back to drinking regular sodas (minis), and snacking more often (bored at home).  She hasn't been checking her sugars (machine broke), not since January. UPDTE IF CHECKING SUGARS   Denies hypoglycemia, polydipsia, polyuria. She denies numbness/tingling/burning or sores in her feet. Last diabetic eye exam was 05/2021    PMH, PSH, SH reviewed    PHYSICAL EXAM:  There were no vitals taken for this visit.  Wt Readings from Last 3 Encounters:  01/03/22 266 lb (120.7 kg)  11/25/21 261 lb 6.4 oz (118.6 kg)  11/14/21 277 lb (125.6 kg)   Pleasant, well-appearing female in no distress HEENT: conjunctiva and sclera are clear, EOMI. Neck: no lymphadenopathy.  No appreciable thyroid nodules Heart: regular rate and rhythm, no murmur Lungs: clear bilaterally Back: no spinal or CVA tenderness. No muscle spasm. Abdomen: soft, obese, nontender Extremities: no edema Skin: normal turgor, no visible rash Psych: normal mood, affect, hygiene and grooming. Neuro: alert and oriented, normal gait     ASSESSMENT/PLAN:  A1c  Update imms if she got a bivalent COVID booster anywhere.   RF losartan, metformin RF trulicity (same dose or higher?)  F/u in September as scheduled

## 2022-03-03 ENCOUNTER — Ambulatory Visit: Payer: BC Managed Care – PPO | Admitting: Family Medicine

## 2022-03-03 ENCOUNTER — Encounter: Payer: Self-pay | Admitting: Family Medicine

## 2022-03-03 VITALS — BP 132/80 | HR 63 | Wt 255.5 lb

## 2022-03-03 DIAGNOSIS — E1159 Type 2 diabetes mellitus with other circulatory complications: Secondary | ICD-10-CM

## 2022-03-03 DIAGNOSIS — E785 Hyperlipidemia, unspecified: Secondary | ICD-10-CM

## 2022-03-03 DIAGNOSIS — R252 Cramp and spasm: Secondary | ICD-10-CM

## 2022-03-03 DIAGNOSIS — E1169 Type 2 diabetes mellitus with other specified complication: Secondary | ICD-10-CM | POA: Diagnosis not present

## 2022-03-03 DIAGNOSIS — Z6841 Body Mass Index (BMI) 40.0 and over, adult: Secondary | ICD-10-CM

## 2022-03-03 DIAGNOSIS — I10 Essential (primary) hypertension: Secondary | ICD-10-CM

## 2022-03-03 DIAGNOSIS — I152 Hypertension secondary to endocrine disorders: Secondary | ICD-10-CM

## 2022-03-03 LAB — POCT GLYCOSYLATED HEMOGLOBIN (HGB A1C): Hemoglobin A1C: 6.8 % — AB (ref 4.0–5.6)

## 2022-03-03 MED ORDER — TRULICITY 0.75 MG/0.5ML ~~LOC~~ SOAJ: 0.7500 mg | SUBCUTANEOUS | 3 refills | Status: DC

## 2022-03-03 MED ORDER — ONETOUCH VERIO VI STRP: ORAL_STRIP | 2 refills | Status: DC

## 2022-03-03 MED ORDER — LOSARTAN POTASSIUM-HCTZ 100-25 MG PO TABS: 1.0000 | ORAL_TABLET | Freq: Every day | ORAL | 1 refills | Status: DC

## 2022-03-03 MED ORDER — METFORMIN HCL ER 500 MG PO TB24: ORAL_TABLET | ORAL | 1 refills | Status: DC

## 2022-03-03 NOTE — Patient Instructions (Addendum)
Please stop drinking regular sodas and limit your sweets/snacking with the grandkids. This should help you with weight loss, and further improve sugars.  We briefly discussed titrating up the Trulicity dose to the 1.'5mg'$  dose, expecting that you'd be able to cut back on the metformin.  You elected to stay at the current dose (and 4 metformin daily) at this time. We will discuss this again at your next visit.  Be sure to stay well hydrated. Be sure to get enough potassium in your diet. If the leg cramps don't improve, we can check a potassium level.  Consider the white soap trick (bar of soap under the sheets--google this under [facility]).

## 2022-05-05 ENCOUNTER — Telehealth: Payer: Self-pay | Admitting: *Deleted

## 2022-05-05 DIAGNOSIS — F988 Other specified behavioral and emotional disorders with onset usually occurring in childhood and adolescence: Secondary | ICD-10-CM

## 2022-05-05 MED ORDER — AMPHETAMINE-DEXTROAMPHETAMINE 20 MG PO TABS: 20.0000 mg | ORAL_TABLET | Freq: Two times a day (BID) | ORAL | 0 refills | Status: DC

## 2022-05-05 NOTE — Telephone Encounter (Signed)
Patient is requesting a refill of adderall to please be sent to [facility].

## 2022-05-17 ENCOUNTER — Telehealth: Payer: Self-pay | Admitting: Family Medicine

## 2022-05-17 NOTE — Telephone Encounter (Signed)
P.A. ADDERALL  

## 2022-05-28 ENCOUNTER — Encounter: Payer: Self-pay | Admitting: Internal Medicine

## 2022-05-31 NOTE — Telephone Encounter (Signed)
P.A. Approved til 05/09/23, sent mychart message

## 2022-06-03 NOTE — Patient Instructions (Addendum)
  HEALTH MAINTENANCE RECOMMENDATIONS:  It is recommended that you get at least 30 minutes of aerobic exercise at least 5 days/week (for weight loss, you may need as much as 60-90 minutes). This can be any activity that gets your heart rate up. This can be divided in 10-15 minute intervals if needed, but try and build up your endurance at least once a week.  Weight bearing exercise is also recommended twice weekly.  Eat a healthy diet with lots of vegetables, fruits and fiber.  "Colorful" foods have a lot of vitamins (ie green vegetables, tomatoes, red peppers, etc).  Limit sweet tea, regular sodas and alcoholic beverages, all of which has a lot of calories and sugar.  Up to 1 alcoholic drink daily may be beneficial for women (unless trying to lose weight, watch sugars).  Drink a lot of water.  Calcium recommendations are 1200-1500 mg daily (1500 mg for postmenopausal women or women without ovaries), and vitamin D 1000 IU daily.  This should be obtained from diet and/or supplements (vitamins), and calcium should not be taken all at once, but in divided doses.  Monthly self breast exams and yearly mammograms for women over the age of 18 is recommended.  Sunscreen of at least SPF 30 should be used on all sun-exposed parts of the skin when outside between the hours of 10 am and 4 pm (not just when at beach or pool, but even with exercise, golf, tennis, and yard work!)  Use a sunscreen that says "broad spectrum" so it covers both UVA and UVB rays, and make sure to reapply every 1-2 hours.  Remember to change the batteries in your smoke detectors when changing your clock times in the spring and fall. Carbon monoxide detectors are recommended for your home.  Use your seat belt every time you are in a car, and please drive safely and not be distracted with cell phones and texting while driving.  COVID booster is recommended once the new one becomes available (within the next month). This should be separated  from any other vaccines by 2 weeks.  Please try and switch to lower sugar/calorie juices (Trop50, a diet Cranberry juice), and use the smaller cups (4 ounce).  Try and eliminate regular sodas entirely.  Please schedule your yearly diabetic eye exam.  Consider titrating up the Trulicity, and we could then back down on how much metformin you take (and potentially have additional help with weight loss).

## 2022-06-03 NOTE — Progress Notes (Unsigned)
Chief Complaint  Patient presents with   Annual Exam    Fasting annual exam, no pap-sees Dr. Quincy Simmonds. 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     Crystal [name] is a 56 y.o. female who presents for a complete physical and follow-up on chronic problems.   She has the following concerns:  She states that her BP was up at recent DOT physical. They said she met criteria for sleep study and referred for sleep study (due to DM, HTN, BMI and age).  She needs it done within 3 months. Last sleep study was in 2015 (pt states she thinks it is needed, if criteria met, every 7 years?)  Diabetes. A1c was 6.8% in 02/2022. She declined increasing Trulicity (and titrating down metformin), continues on Trulicity 3.40BT and Metformin 2040m daily (with dinner). She denies side effects to medications. She tries to follow a low sugar, low-carb diet, but reports isn't always as good as she should be. She snacks some, has some "mini" regular sodas, but not frequently.  She continues to use smaller plates for smaller portions. She does report that her diet was worse after her best friend passed away on 808/12/24  She is better now, busy and back at work. Eating more fruits. Had 3 regular "mini" sodas in the last week. Drinks 100% juice, 4-8 ounces daily (grape, apple or orange juice). No longer drinks KBoeing Sugars are usually <100 in the morning.  Max has been <120 (checking 3 hours after eating).   Denies hypoglycemia, polydipsia, polyuria. She denies numbness/tingling/burning or sores in her feet. Last diabetic eye exam was 05/2021    Left thyroid nodule--she underwent L thyroid biopsy in 10/2020, results were inconclusive, insufficient cells.  Options given to pt included re-try in 3 mos, vs surgical excision of L thyroid, vs recheck [place] She was referred to Dr. BNinfa Linden planned for surgery, was delayed due to her back surgery.  Nodule got smaller.   She denies  any increase in size. She had f/u [place] 11/2021: IMPRESSION: Significant interval reduction in size of previously biopsied left thyroid nodule, likely due to loss of fluid. Please correlate with prior FNA results.   Hypertension-- She reports compliance with her medications (losartan-HCT), denies side effects.   BP's have been running 118-135/77-84 Denies headaches (had sinus headaches, which improved with Flonase/allergy meds), dizziness, chest pain, muscle cramps. Denies edema.    Hyperlipidemia: She is tolerating daily Crestor without side effects (previously didn't tolerate atorvastatin due to myalgias).  Lipids were at goal on last check. Lab Results  Component Value Date   CHOL 108 11/25/2021   HDL 33 (L) 11/25/2021   LDLCALC 57 11/25/2021   TRIG 95 11/25/2021   CHOLHDL 3.3 11/25/2021   ADHD: She had been taking the Adderall once daily, during the week only.  She recently went back to work, works until 7:30, struggles some in the evenings, plans to restart it twice daily, just a half tablet in the afternoon. Overall, this medication is effective and she denies side effects.  She reports once daily was enough when not working. Last filled #60 on 05/05/22.   H/o vitamin D deficiency--low in 2016-2017, normal since 2018, when taking 2000 IU daily. She continues to take 2000 IU daily.   Iron deficiency anemia: She is s/p EGD and Colonoscopy in August, 2018 (reactive gastropathy, inflammation in stomach, internal hemorrhoids and diverticulosis on colonoscopy). Gastroenterologist did not see  a GI source for iron deficiency, suspected related to menstrual losses.  She does have fibroids. She is under the care of Dr. Quincy Simmonds. Her cycles now are very light, mainly spotting, once a month.  No further heavy cycles or cramps. She stopped taking iron in March, after last labs. Denies fatigue or dizziness, denies hot flashes or night sweats.    Lab Results  Component Value Date   WBC 5.5 11/25/2021    HGB 12.4 11/25/2021   HCT 39.4 11/25/2021   MCV 82 11/25/2021   PLT 268 11/25/2021   Lab Results  Component Value Date   IRON 24 (L) 04/03/2014   FERRITIN 31 11/25/2021     Immunization History  Administered Date(s) Administered   Fluad Quad(high Dose 65+) 06/05/2022   Influenza Whole 06/02/2011   Influenza, Seasonal, Injecte, Preservative Fre 10/21/2012   Influenza,inj,Quad PF,6+ Mos 06/09/2013, 07/03/2014, 07/09/2015, 06/23/2016, 09/02/2017, 06/16/2018, 06/29/2019, 05/23/2021   Influenza-Unspecified 07/14/2020   PFIZER Comirnaty(Gray Top)Covid-19 Tri-Sucrose Vaccine 01/16/2021   PFIZER(Purple Top)SARS-COV-2 Vaccination 01/14/2020, 02/04/2020   Pneumococcal Polysaccharide-23 10/21/2012   Tdap 09/20/2013   Zoster Recombinat (Shingrix) 07/02/2018, 09/13/2018   Last Pap smear: 12/2017 with Dr. Quincy Simmonds, normal.  Last mammogram: 10/2021, had benign lipoma on the left Last colonoscopy: 04/2017 Last DEXA: never Dentist: every 6 months. Ophtho: 05/2021 Exercise: goes to the [facility] 3x/week--weights and cardio machines (30 minutes). No longer doing water exercises (due to work scheduled).    PMH, PSH, SH and FH were reviewed and updated  Outpatient Encounter Medications as of 06/05/2022  Medication Sig Note   amphetamine-dextroamphetamine (ADDERALL) 20 MG tablet Take 1 tablet (20 mg total) by mouth 2 (two) times daily.    Cholecalciferol (VITAMIN D) 2000 units tablet Take 2,000 Units by mouth daily.    Dulaglutide (TRULICITY) 6.60 YT/0.1SW SOPN Inject 0.75 mg into the skin once a week.    fluticasone (FLONASE) 50 MCG/ACT nasal spray SPRAY 2 SPRAYS INTO EACH NOSTRIL EVERY DAY    glucose blood (ONETOUCH VERIO) test strip GLUCOSE CHECK ONCE DAILY    ibuprofen (ADVIL) 200 MG tablet Take 200 mg by mouth every 6 (six) hours as needed. 06/05/2022: 2 this am   Lancets (ONETOUCH DELICA PLUS [id number]) MISC CHECK GLUCOSE ONCE DAILY    losartan-hydrochlorothiazide (HYZAAR) 100-25 MG  tablet Take 1 tablet by mouth daily.    metFORMIN (GLUCOPHAGE-XR) 500 MG 24 hr tablet TAKE 4 TABLETS BY MOUTH EVERY DAY WITH DINNER    Multiple Vitamins-Minerals (MULTIVITAMIN WITH MINERALS) tablet Take 1 tablet by mouth daily. Reported on 01/02/2016    rosuvastatin (CRESTOR) 5 MG tablet Take 1 tablet (5 mg total) by mouth daily.    acetaminophen (TYLENOL) 500 MG tablet Take 1,000 mg by mouth every 6 (six) hours as needed for moderate pain. (Patient not taking: Reported on 06/05/2022) 06/05/2022: prn   triamcinolone cream (KENALOG) 0.1 % Apply 1 application topically 2 (two) times daily as needed (rash). (Patient not taking: Reported on 06/05/2022) 06/05/2022: rash   No facility-administered encounter medications on file as of 06/05/2022.   Allergies  Allergen Reactions   Onglyza [Saxagliptin] Other (See Comments)    Eye irritation (when on Kombiglyze; tolerates metformin without problems)  Patient denies allergy to this product. States that it was another medication    ROS:  The patient denies anorexia, fever, vision changes, decreased hearing, ear pain, sore throat, chest pain, palpitations, dizziness, syncope, dyspnea on exertion, cough, swelling, nausea, vomiting, diarrhea, constipation, abdominal pain, melena, indigestion/heartburn, hematuria, incontinence, dysuria, vaginal discharge,  odor or itch, genital lesions, numbness, tingling, weakness, tremor, suspicious skin lesions, depression, anxiety, abnormal bleeding/bruising, or enlarged lymph nodes.  Denies change to L thyroid nodule, remains smaller, no trouble swallowing. Menses--monthly, lighter, mostly spotting. Denies hot flashes, night sweats. No longer having knee pain. No longer having leg cramps. Allergies recently flared, controlled with current meds. 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    PHYSICAL  EXAM:  BP 130/84   Pulse 80   Ht 5' 4.5" (1.638 m)   Wt 256 lb 9.6 oz (116.4 kg)   BMI 43.37 kg/m   Wt Readings from Last 3 Encounters:  06/05/22 256 lb 9.6 oz (116.4 kg)  03/03/22 255 lb 8 oz (115.9 kg)  01/03/22 266 lb (120.7 kg)    General Appearance:     Alert, cooperative, no distress, appears stated age. She elected not to change into a gown for today's exam.  Head:     Normocephalic, without obvious abnormality, atraumatic   Eyes:     PERRL, conjunctiva/corneas clear, EOM's intact, fundi benign   Ears:     Normal TM's and external ear canals   Nose:    No drainage or sinus tenderness, mild mucosal edema  Throat:    Normal, no lesions  Neck:    Supple, no lymphadenopathy;  thyroid: nontender, mild diffuse enlargement, no discrete mass/nodule appreciated; no carotid bruit or JVD   Back:     No spinal or CVA tenderness.  Lungs:      Clear to auscultation bilaterally without wheezes, rales or ronchi; respirations unlabored   Chest Wall:     No tenderness or deformity    Heart:     Regular rate and rhythm, S1 and S2 normal, no murmur, rub or gallop   Breast Exam:     Deferred to GYN  Abdomen:      Soft, obese, non-tender, nondistended, normoactive bowel sounds,  no masses, no hepatosplenomegaly   Genitalia:     Deferred to GYN        Extremities:    No clubbing, cyanosis or edema.   Pulses:    2+ and symmetric all extremities   Skin:    Skin color, texture, turgor normal, no rashes or lesions, though skin exam is limited due to not changing into gown.  Normal skin exam of feet, normal monofilament exam.  Lymph nodes:    Cervical, supraclavicular nodes normal   Neurologic:    Normal strength, sensation and gait; reflexes slightly diminished at knees, less on R than L                     Psych:   Normal mood, affect, hygiene and grooming   Normal diabetic foot exam  Lab Results  Component Value Date   HGBA1C 6.1 (A) 06/05/2022    ASSESSMENT/PLAN:  Annual physical exam -  Plan: POCT Urinalysis DIP (Proadvantage Device), Comprehensive metabolic panel, CBC with Differential/Platelet, TSH  Type 2 diabetes mellitus with other specified complication, without long-term current use of insulin ([place]) - well controlled; declines change in meds. Discussed titrating up Trulicity (and lowering metformin), potentially could help with wt, and decreases pills - Plan: HgB A1c, Comprehensive metabolic panel, Microalbumin / creatinine urine ratio, TSH  Hypertension associated with diabetes ([place]) - controlled  Dyslipidemia associated with type 2 diabetes mellitus ([place]) - controlled, cont meds and lowfat,  low cholesterol diet  Essential hypertension - controlled - Plan: Comprehensive metabolic panel  Attention deficit disorder, unspecified hyperactivity presence - will try 1 pill in morning and 1/2 tablet in afternoon with her longer work schedule now. Reviewed risks/SE  Thyroid nodule - Plan: TSH  Iron deficiency anemia, unspecified iron deficiency anemia type - Plan: CBC with Differential/Platelet  Need for influenza vaccination - Plan: Flu Vaccine QUAD 6+ mos PF IM (Fluarix Quad PF)  Class 3 severe obesity due to excess calories with serious comorbidity and body mass index (BMI) of 40.0 to 44.9 in adult [facility]) - counseled re: diet, exercise, encouraged further wt loss. - Plan: Home sleep test  At risk for sleep apnea - reviewed risks and consequences of untreated OSA.  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 Counseled extensively on diet (juices, soda)--both for control of DM (and needing less meds) and for weight loss.  No RF needed today   Discussed monthly self breast exams and yearly mammograms; at least 30 minutes of aerobic activity at least 5 days/week; weight bearing exercise at least 2x/week; proper sunscreen use reviewed; healthy diet, including goals of calcium and vitamin D intake and alcohol  recommendations (less than or equal to 1 drink/day) reviewed; regular seatbelt use; changing batteries in smoke detectors.  Immunization recommendations discussed. Continue yearly flu shots, given today. Updated COVID vaccine recommended when available, soon.  Colonoscopy recommendations reviewed, UTD Reminded to schedule diabetic eye exam.  F/u 6 mos med check (fasting), sooner prn.

## 2022-06-05 ENCOUNTER — Ambulatory Visit: Payer: BC Managed Care – PPO | Admitting: Family Medicine

## 2022-06-05 ENCOUNTER — Encounter: Payer: Self-pay | Admitting: Family Medicine

## 2022-06-05 VITALS — BP 130/84 | HR 80 | Ht 64.5 in | Wt 256.6 lb

## 2022-06-05 DIAGNOSIS — Z23 Encounter for immunization: Secondary | ICD-10-CM

## 2022-06-05 DIAGNOSIS — E1169 Type 2 diabetes mellitus with other specified complication: Secondary | ICD-10-CM

## 2022-06-05 DIAGNOSIS — Z6841 Body Mass Index (BMI) 40.0 and over, adult: Secondary | ICD-10-CM

## 2022-06-05 DIAGNOSIS — Z9189 Other specified personal risk factors, not elsewhere classified: Secondary | ICD-10-CM | POA: Diagnosis not present

## 2022-06-05 DIAGNOSIS — I10 Essential (primary) hypertension: Secondary | ICD-10-CM

## 2022-06-05 DIAGNOSIS — I152 Hypertension secondary to endocrine disorders: Secondary | ICD-10-CM

## 2022-06-05 DIAGNOSIS — D509 Iron deficiency anemia, unspecified: Secondary | ICD-10-CM

## 2022-06-05 DIAGNOSIS — E1159 Type 2 diabetes mellitus with other circulatory complications: Secondary | ICD-10-CM

## 2022-06-05 DIAGNOSIS — F988 Other specified behavioral and emotional disorders with onset usually occurring in childhood and adolescence: Secondary | ICD-10-CM

## 2022-06-05 DIAGNOSIS — E041 Nontoxic single thyroid nodule: Secondary | ICD-10-CM

## 2022-06-05 DIAGNOSIS — Z0000 Encounter for general adult medical examination without abnormal findings: Secondary | ICD-10-CM | POA: Diagnosis not present

## 2022-06-05 DIAGNOSIS — R519 Headache, unspecified: Secondary | ICD-10-CM

## 2022-06-05 DIAGNOSIS — E785 Hyperlipidemia, unspecified: Secondary | ICD-10-CM

## 2022-06-05 LAB — POCT URINALYSIS DIP (PROADVANTAGE DEVICE)
Blood, UA: NEGATIVE
Glucose, UA: NEGATIVE mg/dL
Ketones, POC UA: NEGATIVE mg/dL
Leukocytes, UA: NEGATIVE
Nitrite, UA: NEGATIVE
Protein Ur, POC: NEGATIVE mg/dL
Specific Gravity, Urine: 1.025
Urobilinogen, Ur: NEGATIVE
pH, UA: 6 (ref 5.0–8.0)

## 2022-06-05 LAB — POCT GLYCOSYLATED HEMOGLOBIN (HGB A1C): Hemoglobin A1C: 6.1 % — AB (ref 4.0–5.6)

## 2022-06-06 ENCOUNTER — Encounter: Payer: Self-pay | Admitting: Family Medicine

## 2022-06-07 LAB — CBC WITH DIFFERENTIAL/PLATELET
Basophils Absolute: 0 10*3/uL (ref 0.0–0.2)
Basos: 0 %
EOS (ABSOLUTE): 0.2 10*3/uL (ref 0.0–0.4)
Eos: 3 %
Hematocrit: 38.1 % (ref 34.0–46.6)
Hemoglobin: 12.9 g/dL (ref 11.1–15.9)
Immature Grans (Abs): 0 10*3/uL (ref 0.0–0.1)
Immature Granulocytes: 0 %
Lymphocytes Absolute: 2.1 10*3/uL (ref 0.7–3.1)
Lymphs: 32 %
MCH: 29.1 pg (ref 26.6–33.0)
MCHC: 33.9 g/dL (ref 31.5–35.7)
MCV: 86 fL (ref 79–97)
Monocytes Absolute: 0.4 10*3/uL (ref 0.1–0.9)
Monocytes: 5 %
Neutrophils Absolute: 3.9 10*3/uL (ref 1.4–7.0)
Neutrophils: 60 %
Platelets: 266 10*3/uL (ref 150–450)
RBC: 4.43 x10E6/uL (ref 3.77–5.28)
RDW: 14.2 % (ref 11.7–15.4)
WBC: 6.6 10*3/uL (ref 3.4–10.8)

## 2022-06-07 LAB — COMPREHENSIVE METABOLIC PANEL
ALT: 10 IU/L (ref 0–32)
AST: 11 IU/L (ref 0–40)
Albumin/Globulin Ratio: 1.7 (ref 1.2–2.2)
Albumin: 4.3 g/dL (ref 3.8–4.9)
Alkaline Phosphatase: 71 IU/L (ref 44–121)
BUN/Creatinine Ratio: 13 (ref 9–23)
BUN: 12 mg/dL (ref 6–24)
Bilirubin Total: 0.3 mg/dL (ref 0.0–1.2)
CO2: 26 mmol/L (ref 20–29)
Calcium: 9.6 mg/dL (ref 8.7–10.2)
Chloride: 100 mmol/L (ref 96–106)
Creatinine, Ser: 0.89 mg/dL (ref 0.57–1.00)
Globulin, Total: 2.6 g/dL (ref 1.5–4.5)
Glucose: 90 mg/dL (ref 70–99)
Potassium: 3.8 mmol/L (ref 3.5–5.2)
Sodium: 140 mmol/L (ref 134–144)
Total Protein: 6.9 g/dL (ref 6.0–8.5)
eGFR: 77 mL/min/{1.73_m2} (ref >59)

## 2022-06-07 LAB — TSH: TSH: 1.49 u[IU]/mL (ref 0.450–4.500)

## 2022-06-07 LAB — MICROALBUMIN / CREATININE URINE RATIO
Creatinine, Urine: 269 mg/dL
Microalb/Creat Ratio: 4 mg/g creat (ref 0–29)
Microalbumin, Urine: 10.7 ug/mL

## 2022-06-12 ENCOUNTER — Encounter: Payer: Self-pay | Admitting: Family Medicine

## 2022-06-12 ENCOUNTER — Other Ambulatory Visit: Payer: Self-pay | Admitting: *Deleted

## 2022-06-12 DIAGNOSIS — Z9189 Other specified personal risk factors, not elsewhere classified: Secondary | ICD-10-CM

## 2022-07-01 ENCOUNTER — Encounter: Payer: Self-pay | Admitting: Internal Medicine

## 2022-07-04 ENCOUNTER — Ambulatory Visit (HOSPITAL_BASED_OUTPATIENT_CLINIC_OR_DEPARTMENT_OTHER): Payer: BC Managed Care – PPO | Admitting: Internal Medicine

## 2022-07-04 DIAGNOSIS — Z9189 Other specified personal risk factors, not elsewhere classified: Secondary | ICD-10-CM

## 2022-07-08 ENCOUNTER — Ambulatory Visit (HOSPITAL_BASED_OUTPATIENT_CLINIC_OR_DEPARTMENT_OTHER): Payer: BC Managed Care – PPO | Attending: Family Medicine | Admitting: Internal Medicine

## 2022-07-08 VITALS — Ht 65.0 in | Wt 256.0 lb

## 2022-07-08 DIAGNOSIS — E66813 Obesity, class 3: Secondary | ICD-10-CM

## 2022-07-08 DIAGNOSIS — G4733 Obstructive sleep apnea (adult) (pediatric): Secondary | ICD-10-CM | POA: Insufficient documentation

## 2022-07-08 DIAGNOSIS — Z6841 Body Mass Index (BMI) 40.0 and over, adult: Secondary | ICD-10-CM | POA: Diagnosis not present

## 2022-07-08 DIAGNOSIS — Z9189 Other specified personal risk factors, not elsewhere classified: Secondary | ICD-10-CM

## 2022-07-15 ENCOUNTER — Other Ambulatory Visit: Payer: Self-pay | Admitting: Family Medicine

## 2022-07-15 DIAGNOSIS — E1169 Type 2 diabetes mellitus with other specified complication: Secondary | ICD-10-CM

## 2022-07-20 DIAGNOSIS — Z6841 Body Mass Index (BMI) 40.0 and over, adult: Secondary | ICD-10-CM

## 2022-07-20 DIAGNOSIS — Z9189 Other specified personal risk factors, not elsewhere classified: Secondary | ICD-10-CM | POA: Diagnosis not present

## 2022-07-20 NOTE — Procedures (Signed)
    Patient Name: Crystal, [name] Date: 07/08/2022 Gender: Female D.O.B: November 20, 1965 Age (years): 39 Referring Provider: Rita Ohara Height (inches): 65 Interpreting Physician: Baird Lyons MD, ABSM Weight (lbs): 256 RPSGT: Jacolyn Reedy BMI: 43 MRN: [id number] Neck Size: 15.00  CLINICAL INFORMATION Sleep Study Type: HST Indication for sleep study: OSA Epworth Sleepiness Score: 3  SLEEP STUDY TECHNIQUE A multi-channel overnight portable sleep study was performed. The channels recorded were: nasal airflow, thoracic respiratory movement, and oxygen saturation with a pulse oximetry. Snoring was also monitored.  MEDICATIONS Patient self administered medications include: N/A.  SLEEP ARCHITECTURE Patient was studied for 480.4 minutes. The sleep efficiency was 100.0 % and the patient was supine for 0%. The arousal index was 0.0 per hour.  RESPIRATORY PARAMETERS The overall AHI was 5.0 per hour, with a central apnea index of 0 per hour. The oxygen nadir was 91% during sleep.  CARDIAC DATA Mean heart rate during sleep was 70.0 bpm.  IMPRESSIONS - Minimal obstructive sleep apnea occurred during this study (AHI = 5.0/h). At upper limit of normal. Clinical impact is unlikely. - The patient had minimal or no oxygen desaturation during the study (Min O2 = 91%) - Patient snored.  DIAGNOSIS - Obstructive Sleep Apnea (G47.33)  RECOMMENDATIONS - Treatment would be based on clinical judgment. Suggest maintaining normal weight and try to sleep off flat of back. - Be careful with alcohol, sedatives and other CNS depressants that may worsen sleep apnea and disrupt normal sleep architecture. - Sleep hygiene should be reviewed to assess factors that may improve sleep quality. - Weight management and regular exercise should be initiated or continued.  [Electronically signed] 07/20/2022 11:31 AM  Baird Lyons MD, ABSM Diplomate, American Board of Sleep Medicine NPI: [id number]                          [place] [place] [facility]  ELECTRONICALLY SIGNED ON:  07/20/2022, 11:32 AM [place] PH: [phone]   FX: [phone] [place]

## 2022-07-24 ENCOUNTER — Telehealth: Payer: Self-pay | Admitting: Family Medicine

## 2022-07-24 NOTE — Telephone Encounter (Signed)
Form was completed, and is in outgoing red folder in my office. Needs stamp on last page. Please scan copy.

## 2022-07-24 NOTE — Telephone Encounter (Signed)
Pt came in and states she had her dot cpe today and was given a form by her examiner. I am sending back in folder to be completed. Please call pt at [phone] when completed.

## 2022-07-31 NOTE — Telephone Encounter (Signed)
Called patient and let her know form is ready to picked up.

## 2022-09-15 ENCOUNTER — Other Ambulatory Visit: Payer: Self-pay | Admitting: Family Medicine

## 2022-09-15 DIAGNOSIS — E1169 Type 2 diabetes mellitus with other specified complication: Secondary | ICD-10-CM

## 2022-09-15 DIAGNOSIS — I10 Essential (primary) hypertension: Secondary | ICD-10-CM

## 2022-09-24 ENCOUNTER — Other Ambulatory Visit: Payer: Self-pay | Admitting: Family Medicine

## 2022-09-24 DIAGNOSIS — F988 Other specified behavioral and emotional disorders with onset usually occurring in childhood and adolescence: Secondary | ICD-10-CM

## 2022-09-25 MED ORDER — AMPHETAMINE-DEXTROAMPHETAMINE 20 MG PO TABS: 20.0000 mg | ORAL_TABLET | Freq: Two times a day (BID) | ORAL | 0 refills | Status: DC

## 2022-09-25 NOTE — Telephone Encounter (Signed)
Is this okay to refill? 

## 2022-10-06 ENCOUNTER — Encounter: Payer: Self-pay | Admitting: Family Medicine

## 2022-10-06 ENCOUNTER — Ambulatory Visit: Payer: BC Managed Care – PPO | Admitting: Family Medicine

## 2022-10-06 VITALS — BP 120/78 | HR 84 | Ht 64.5 in | Wt 244.2 lb

## 2022-10-06 DIAGNOSIS — Z23 Encounter for immunization: Secondary | ICD-10-CM

## 2022-10-06 DIAGNOSIS — E1169 Type 2 diabetes mellitus with other specified complication: Secondary | ICD-10-CM

## 2022-10-06 DIAGNOSIS — R202 Paresthesia of skin: Secondary | ICD-10-CM | POA: Diagnosis not present

## 2022-10-06 DIAGNOSIS — R112 Nausea with vomiting, unspecified: Secondary | ICD-10-CM

## 2022-10-06 DIAGNOSIS — G5603 Carpal tunnel syndrome, bilateral upper limbs: Secondary | ICD-10-CM | POA: Diagnosis not present

## 2022-10-06 DIAGNOSIS — Z6841 Body Mass Index (BMI) 40.0 and over, adult: Secondary | ICD-10-CM

## 2022-10-06 LAB — POCT GLYCOSYLATED HEMOGLOBIN (HGB A1C): Hemoglobin A1C: 6.4 % — AB (ref 4.0–5.6)

## 2022-10-06 LAB — HM DIABETES EYE EXAM

## 2022-10-06 NOTE — Progress Notes (Signed)
Chief Complaint  Patient presents with   Tingling    Tingling in her fingers and toes. Worse at night, whichever side she sleeps on is worse. Noticed it in Oct and it has worsened. Hands are cold.    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 Right foot more than left. Didn't notice tingling at that time, just the burning sensation. 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 Just recently noticed some tingling in her feet also (sporadic, does notice it while sitting/reading on the toilet, not when having the burning discomfort while driving).  She finds that her feet are cold, hard to warm up, needs to sleep with socks (for a short while).  She also notices tingling in her hands--one arm at a time, related to the side she sleeps on at night. She is having tingling in the office, in both hands, 2nd through 4th fingers, not in the pinkies or thumbs.  She is nervous about neuropathy. She has had a lot of stressors and losses--one friend died, another friend was hit by a car, another person died. Feels very anxious about this.  Diabetes--she stopped drinking sodas in October. She admits she forgot to take her Trulicity for 2 weeks in November (related to all of the tragedies that occurred). She has been eating more pasta/carbs. Sugars--max 138 (2 hours after eating).  Typically are running 76, 88, 108 this morning (ate some tangerines late last night).   Has some nausea, vomits, occasionally (more in the evenings). Eating a lot of tomatoes, tangerines  ADD:  She is having trouble finding her Adderall at the pharmacy--will run out soon. This works well for her.  Lab Results  Component Value Date   HGBA1C 6.1 (A) 06/05/2022   PMH, PSH, SH reviewed  Outpatient Encounter Medications as of 10/06/2022  Medication Sig  Note   amphetamine-dextroamphetamine (ADDERALL) 20 MG tablet Take 1 tablet (20 mg total) by mouth 2 (two) times daily.    fluticasone (FLONASE) 50 MCG/ACT nasal spray SPRAY 2 SPRAYS INTO EACH NOSTRIL EVERY DAY    glucose blood (ONETOUCH VERIO) test strip GLUCOSE CHECK ONCE DAILY    Lancets (ONETOUCH DELICA PLUS [id number]) MISC CHECK GLUCOSE ONCE DAILY    loratadine (CLARITIN) 10 MG tablet Take 10 mg by mouth daily.    losartan-hydrochlorothiazide (HYZAAR) 100-25 MG tablet TAKE 1 TABLET BY MOUTH EVERY DAY    metFORMIN (GLUCOPHAGE-XR) 500 MG 24 hr tablet TAKE 4 TABLETS BY MOUTH EVERY DAY WITH DINNER    rosuvastatin (CRESTOR) 5 MG tablet Take 1 tablet (5 mg total) by mouth daily.    TRULICITY 3.73 SK/8.7GO SOPN INJECT 0.75 MG SUBCUTANEOUSLY ONE TIME PER WEEK    acetaminophen (TYLENOL) 500 MG tablet Take 1,000 mg by mouth every 6 (six) hours as needed for moderate pain. (Patient not taking: Reported on 06/05/2022) 06/05/2022: prn   Cholecalciferol (VITAMIN D) 2000 units tablet Take 2,000 Units by mouth daily. (Patient not taking: Reported on 10/06/2022) 10/06/2022: Stopped in Sept   ibuprofen (ADVIL) 200 MG tablet Take 200 mg by mouth every 6 (six) hours as needed. (Patient not taking: Reported on 10/06/2022) 10/06/2022: prn   Multiple Vitamins-Minerals (MULTIVITAMIN WITH MINERALS) tablet Take 1 tablet by mouth daily. Reported on 01/02/2016 (Patient not taking: Reported on 10/06/2022) 10/06/2022: Stopped in Sept   triamcinolone cream (KENALOG)  0.1 % Apply 1 application topically 2 (two) times daily as needed (rash). (Patient not taking: Reported on 06/05/2022) 06/05/2022: rash   No facility-administered encounter medications on file as of 10/06/2022.   Hasn't taken vitamin D and MVI since September  Allergies  Allergen Reactions   Onglyza [Saxagliptin] Other (See Comments)    Eye irritation (when on [place]; tolerates metformin without problems)  Patient denies allergy to this product. States that it was  another medication    ROS: no fever, chills, URI symptoms (a few weeks ago, not currently), no headaches, dizziness, chest pain, shortness of breath, diarrhea. Has some constipation. Some nausea/vomiting. +tingling in fingers and toes, and sensitivity to heat in her feet as per HPI. +intentional weight loss   PHYSICAL EXAM:  BP 120/78   Pulse 84   Ht 5' 4.5" (1.638 m)   Wt 244 lb 3.2 oz (110.8 kg)   BMI 41.27 kg/m   Wt Readings from Last 3 Encounters:  10/06/22 244 lb 3.2 oz (110.8 kg)  07/08/22 256 lb (116.1 kg)  06/05/22 256 lb 9.6 oz (116.4 kg)   Well-appearing, somewhat anxious female, in no distress HEENT: conjunctiva and sclera are clear, EOMI, OP clear Neck: no lymphadenopathy, thyromegaly or mass, no bruit Heart: regular rate and rhythm Lungs: clear bilaterally Abdomen: soft, nontender, no mass Extremities: normal pulses, no edema. +tinel and Phalen R>L,  Tingling in 2nd and 3rd fingers with Ph[place]. Normal strength, sensation Psych: anxious, full range of affect, normal eye contact, speech, hygiene and grooming   ASSESSMENT/PLAN:  Bilateral carpal tunnel syndrome - R>L.  Discussed using wrist braces while driving and at night.  Type 2 diabetes mellitus with other specified complication, without long-term current use of insulin ([place]) - controlled, continue current meds. Reassured her sx not likely diabetic neuropathy with controlled sugars - Plan: HgB A1c  Need for COVID-19 vaccine - Plan: Pfizer Fall 2023 Covid-19 Vaccine 21yr and older  Tingling of both feet - seems positional; not related to sensitivity to heat. Will avoid prolonged sitting on toilet and monitor for change. Reassured; Ddx discussed  Class 3 severe obesity due to excess calories with serious comorbidity and body mass index (BMI) of 40.0 to 44.9 in adult ([facility] - significant wt loss noted; continue healthy diet, current meds  Nausea and vomiting, unspecified vomiting type - discussed potential SE  from trulicity vs GERD. Suspect GERD due to being in evenings, not overeating. Reviewed reflux precautions  F/u as scheduled in March

## 2022-10-06 NOTE — Patient Instructions (Addendum)
Check with the [facility] pharmacies to see if they carry the Adderall (if they don't have the '20mg'$ , ask if they have '10mg'$ , and we can double up).  I wonder if reflux can be contributing to your nausea and vomiting (since it doesn't sound to be related to overeating and your Trulicity).  Try and wait at least 2 hours after eating before laying down.  Cut back on citrus, tomatoes, spicy foods, caffeine.  Pepcid (famotidine), or prilosec OTC are options that you can try (over-the-counter) if having ongoing nausea/reflux symptoms.  Your hand tingling is most likely related to carpal tunnel syndrome. Try wearing one of the wrist braces while driving, and to sleep, since those are the times that bother you the most.  Be sure not to sit on the toilet for too long (reading!), as this can cause tingling in your feet/legs. I'm not sure why there is the sensitivity to the heat in the bus, but it doesn't seem to act like a typical neuropathy (which would hurt more constantly, and at night). Let me know if things change or worsen.  We could consider additional studies.

## 2022-10-07 ENCOUNTER — Other Ambulatory Visit (HOSPITAL_COMMUNITY): Payer: Self-pay

## 2022-10-07 ENCOUNTER — Encounter: Payer: Self-pay | Admitting: Family Medicine

## 2022-10-07 DIAGNOSIS — F988 Other specified behavioral and emotional disorders with onset usually occurring in childhood and adolescence: Secondary | ICD-10-CM

## 2022-10-07 MED ORDER — AMPHETAMINE-DEXTROAMPHETAMINE 20 MG PO TABS
20.0000 mg | ORAL_TABLET | Freq: Two times a day (BID) | ORAL | 0 refills | Status: DC
  Filled 2022-10-07: qty 60, 30d supply, fill #0

## 2022-10-09 ENCOUNTER — Encounter: Payer: Self-pay | Admitting: *Deleted

## 2022-10-28 ENCOUNTER — Other Ambulatory Visit: Payer: Self-pay

## 2022-11-27 ENCOUNTER — Other Ambulatory Visit: Payer: Self-pay | Admitting: Family Medicine

## 2022-11-27 DIAGNOSIS — F988 Other specified behavioral and emotional disorders with onset usually occurring in childhood and adolescence: Secondary | ICD-10-CM

## 2022-11-27 MED ORDER — AMPHETAMINE-DEXTROAMPHETAMINE 20 MG PO TABS
20.0000 mg | ORAL_TABLET | Freq: Two times a day (BID) | ORAL | 0 refills | Status: DC
  Filled 2022-11-27: qty 60, 30d supply, fill #0

## 2022-11-28 ENCOUNTER — Other Ambulatory Visit (HOSPITAL_BASED_OUTPATIENT_CLINIC_OR_DEPARTMENT_OTHER): Payer: Self-pay

## 2022-11-28 ENCOUNTER — Other Ambulatory Visit: Payer: Self-pay

## 2022-12-07 NOTE — Progress Notes (Unsigned)
No chief complaint on file.  Patient presents for 6 month follow-up on chronic problems.  She was last seen in January, with carpal tunnel syndrome.  She was encouraged to wear braces while driving.  Diabetes. A1c was 6.4% on last check in 09/2022. She previously declined increasing Trulicity (and titrating down metformin), continues on Trulicity 0.75mg  and Metformin 2000mg  daily (with dinner). She denies side effects to medications. In January she had reported some increased nausea, occasionally vomiting in the evenings.  We discussed this could be related to reflux (was eating more tomatoes, tangerines), poss related to GLP, encouraged her to eat small meals, and wait 2 hours after eating before reclining.   UPDATE  She tries to follow a low sugar, low-carb diet, but reports isn't always as good as she should be. She continues to use smaller plates for smaller portions.  Regular "mini" sodas?  Juices?    Sugars are running  Lab Results  Component Value Date   HGBA1C 6.4 (A) 10/06/2022    Denies hypoglycemia, polydipsia, polyuria. She denies numbness/tingling/burning or sores in her feet. (Just occasional positional tingling) Last diabetic eye exam was 09/2022, no retinopathy.    Left thyroid nodule--she underwent L thyroid biopsy in 10/2020, results were inconclusive, insufficient cells.  Options given to pt included re-try in 3 mos, vs surgical excision of L thyroid, vs recheck [place]. She was referred to Dr. Ninfa Linden, planned for surgery, was delayed due to her back surgery.  Nodule got smaller.   She denies any increase in size. She had f/u US in 11/2021: IMPRESSION: Significant interval reduction in size of previously biopsied left thyroid nodule, likely due to loss of fluid. Please correlate with prior FNA results. She denies any changes.   Hypertension-- She reports compliance with her medications (losartan-HCT), denies side effects.   BP's have been running   Denies headaches  (other than occasional sinus headache), dizziness, chest pain, muscle cramps. Denies edema.    Hyperlipidemia: She is tolerating daily Crestor without side effects (previously didn't tolerate atorvastatin due to myalgias).  Lipids were at goal on last check, due or recheck. Lab Results  Component Value Date   CHOL 108 11/25/2021   HDL 33 (L) 11/25/2021   LDLCALC 57 11/25/2021   TRIG 95 11/25/2021   CHOLHDL 3.3 11/25/2021    ADHD: She had been taking the Adderall during the week only.   Qd or BID???  Overall, this medication is effective and she denies side effects.   Last filled #60 on 11/28/22   H/o vitamin D deficiency--low in 2016-2017, normal since 2018, when taking 2000 IU daily. She continues to take 2000 IU daily.    PMH, PSH, SH reviewed   ROS: no fever, chills, URI symptoms, no headaches, dizziness, chest pain, shortness of breath, diarrhea.  Has some constipation.  nausea/vomiting.  +tingling in fingers Tingling in toes? Weight?    PHYSICAL EXAM:  There were no vitals taken for this visit.  Wt Readings from Last 3 Encounters:  10/06/22 244 lb 3.2 oz (110.8 kg)  07/08/22 256 lb (116.1 kg)  06/05/22 256 lb 9.6 oz (116.4 kg)   Well-appearing, in no distress HEENT: conjunctiva and sclera are clear, EOMI, OP clear Neck: no lymphadenopathy, thyromegaly or mass, no bruit Heart: regular rate and rhythm Lungs: clear bilaterally Abdomen: soft, nontender, no mass Extremities: normal pulses, no edema. Neuro: alert and oriented. Cranial nerves grossly intact. Normal gait. Psych: anxious, full range of affect, normal eye contact, speech, hygiene and grooming  ASSESSMENT/PLAN:  Lipid  Losartan, metformin  RF CRESTOR AFTER LABS BACK   F/u in October for CPE, as scheduled.  Labs prior??

## 2022-12-08 ENCOUNTER — Ambulatory Visit: Payer: BC Managed Care – PPO | Admitting: Family Medicine

## 2022-12-08 ENCOUNTER — Encounter: Payer: Self-pay | Admitting: Family Medicine

## 2022-12-08 VITALS — BP 120/70 | HR 64 | Temp 96.9°F | Ht 64.5 in | Wt 238.0 lb

## 2022-12-08 DIAGNOSIS — R0981 Nasal congestion: Secondary | ICD-10-CM

## 2022-12-08 DIAGNOSIS — I152 Hypertension secondary to endocrine disorders: Secondary | ICD-10-CM

## 2022-12-08 DIAGNOSIS — E1169 Type 2 diabetes mellitus with other specified complication: Secondary | ICD-10-CM

## 2022-12-08 DIAGNOSIS — E1159 Type 2 diabetes mellitus with other circulatory complications: Secondary | ICD-10-CM

## 2022-12-08 DIAGNOSIS — E785 Hyperlipidemia, unspecified: Secondary | ICD-10-CM

## 2022-12-08 DIAGNOSIS — E041 Nontoxic single thyroid nodule: Secondary | ICD-10-CM | POA: Diagnosis not present

## 2022-12-08 DIAGNOSIS — I10 Essential (primary) hypertension: Secondary | ICD-10-CM | POA: Diagnosis not present

## 2022-12-08 LAB — POC COVID19 BINAXNOW: SARS Coronavirus 2 Ag: NEGATIVE

## 2022-12-08 MED ORDER — METFORMIN HCL ER 500 MG PO TB24: ORAL_TABLET | ORAL | 1 refills | Status: DC

## 2022-12-08 MED ORDER — LOSARTAN POTASSIUM-HCTZ 100-25 MG PO TABS: 1.0000 | ORAL_TABLET | Freq: Every day | ORAL | 1 refills | Status: DC

## 2022-12-08 NOTE — Patient Instructions (Addendum)
Stay well hydrated. Continue daily flonase and claritin. Try sinus rinses (Neti-pot or sinus rinse kit, or navage--use distilled or boiled water, not tap water), once or twice daily. Use Mucinex as needed (expectorant to loosen your mucus--in nose and chest). If the mucus becomes discolored, if you have fever, or other worsening symptoms, this could indicate an infection.  Consider keeping the windows closed, to keep the pollen and allergens outside your home.  I'd like for you to avoid drinking juice, and instead eat the fruit. Continue to try and avoid all soda.

## 2022-12-09 LAB — LIPID PANEL
Chol/HDL Ratio: 3.1 ratio (ref 0.0–4.4)
Cholesterol, Total: 117 mg/dL (ref 100–199)
HDL: 38 mg/dL — ABNORMAL LOW (ref >39)
LDL Chol Calc (NIH): 63 mg/dL (ref 0–99)
Triglycerides: 82 mg/dL (ref 0–149)
VLDL Cholesterol Cal: 16 mg/dL (ref 5–40)

## 2022-12-09 MED ORDER — ROSUVASTATIN CALCIUM 5 MG PO TABS: 5.0000 mg | ORAL_TABLET | Freq: Every day | ORAL | 3 refills | Status: DC

## 2022-12-17 ENCOUNTER — Encounter: Payer: Self-pay | Admitting: Family Medicine

## 2022-12-17 ENCOUNTER — Ambulatory Visit: Payer: BC Managed Care – PPO | Admitting: Family Medicine

## 2022-12-17 VITALS — BP 130/70 | HR 84 | Temp 99.1°F | Ht 64.5 in | Wt 236.8 lb

## 2022-12-17 DIAGNOSIS — R221 Localized swelling, mass and lump, neck: Secondary | ICD-10-CM

## 2022-12-17 DIAGNOSIS — J320 Chronic maxillary sinusitis: Secondary | ICD-10-CM | POA: Diagnosis not present

## 2022-12-17 MED ORDER — AMOXICILLIN-POT CLAVULANATE 875-125 MG PO TABS: 1.0000 | ORAL_TABLET | Freq: Two times a day (BID) | ORAL | 0 refills | Status: DC

## 2022-12-17 NOTE — Patient Instructions (Addendum)
  You have a sinus infection (on the left, affecting your cheek). Continue the mucinex. Consider sinus rinses. Take the antibiotic twice daily for 10 days.  The lump on your left neck--doesn't feel like a swollen gland (it is longer, and a different shape than a swollen lymph node).  But, given that you do have an infection, let's try the antibiotics, continue use of advil and ice, and follow-up next week if not improving.  We may need to check some blood tests and do a CT to see what is going on in the neck.  If you develop worsening swelling, high fever, trouble breathing or swallowing, trouble moving your neck, then go to the ER (they can get the neck CT done sooner (and do bloodwork).  You can use up to 800 mg (4 pills) of advil, taken with food, every 8 hours.  This is the equivalent prescription dose.  Feel free to use the lowest effective dose.  You may also use tylenol for pain or fever, if needed.  Follow up next week if not significantly better.

## 2022-12-17 NOTE — Progress Notes (Signed)
Chief Complaint  Patient presents with   Edema    Left side of neck swollen and painful. Used ice night, has been using heat and taking ibuprofen every 4hours since Sat.    3/23 she noticed some discomfort on the left side of her neck.  She thought it was a "crook" in her neck, muscular. She also noticed a lump on the left neck that day as well, that is tender to touch. She iced it last night, and that helped. Less painful, but no change in size. She used heat prior to that, which didn't help much at all. She has felt more chilled, not aware of a fever.  Seen 3/18 with nasal congestion.  She is now taking claritin and mucinex. She reports that her throat is very dry on the left side at night. Hard to swallow dry things (toast, peanut butter). Still has some sinus headache at her L forehead, also has some tenderness at L cheek. Phlegm is yellowish now--it had been clear at her last visit.  Using 1 advil (200 mg of ibuprofen) every 4 hours; last dose at 10:30 this morning (5 hours ago). Current pain is still okay (was in tears x 3d, better today)   PMH, PSH, SH reviewed  Outpatient Encounter Medications as of 12/17/2022  Medication Sig Note   Cholecalciferol (VITAMIN D3) 50 MCG (2000 UT) TABS Take 1 tablet by mouth daily.    fluticasone (FLONASE) 50 MCG/ACT nasal spray SPRAY 2 SPRAYS INTO EACH NOSTRIL EVERY DAY    glucose blood (ONETOUCH VERIO) test strip GLUCOSE CHECK ONCE DAILY    ibuprofen (ADVIL) 200 MG tablet Take 200 mg by mouth every 6 (six) hours as needed. 12/17/2022: Last dose 10:30am   Lancets (ONETOUCH DELICA PLUS [id number]) MISC CHECK GLUCOSE ONCE DAILY    loratadine (CLARITIN) 10 MG tablet Take 10 mg by mouth daily.    losartan-hydrochlorothiazide (HYZAAR) 100-25 MG tablet Take 1 tablet by mouth daily.    metFORMIN (GLUCOPHAGE-XR) 500 MG 24 hr tablet TAKE 4 TABLETS BY MOUTH EVERY DAY WITH DINNER    Multiple Vitamins-Minerals (MULTIVITAMIN WITH MINERALS) tablet Take 1 tablet by  mouth daily. Reported on 01/02/2016    rosuvastatin (CRESTOR) 5 MG tablet Take 1 tablet (5 mg total) by mouth daily.    TRULICITY [id number] SOPN INJECT 0.75 MG SUBCUTANEOUSLY ONE TIME PER WEEK 12/17/2022: Takes every Thursday   acetaminophen (TYLENOL) 500 MG tablet Take 1,000 mg by mouth every 6 (six) hours as needed for moderate pain. (Patient not taking: Reported on 06/05/2022) 12/17/2022: As needed   amphetamine-dextroamphetamine (ADDERALL) 20 MG tablet Take 1 tablet (20 mg total) by mouth 2 (two) times daily. (Patient not taking: Reported on 12/17/2022) 12/17/2022: Only when she is working, currently on Spring Break   triamcinolone cream (KENALOG) 0.1 % Apply 1 application topically 2 (two) times daily as needed (rash). (Patient not taking: Reported on 06/05/2022) 12/17/2022: As needed   No facility-administered encounter medications on file as of 12/17/2022.   Mucinex (guaifenesin)   Allergies  Allergen Reactions   Onglyza [Saxagliptin] Other (See Comments)    Eye irritation (when on Kombiglyze; tolerates metformin without problems)  Patient denies allergy to this product. States that it was another medication    ROS:  some chills, no known fever.  URI symptoms per HPI. No shortness of breath.  Just occasional cough. No chest pain, palpitations. Some trouble swallowing, dry at L throat, per HPI. Painful lump L neck. No change to the thyroid that  she is aware of, still a little bigger on the left.   PHYSICAL EXAM:  BP 130/70   Pulse 84   Temp 99.1 F (37.3 C) (Tympanic)   Ht 5' 4.5" (1.638 m)   Wt 236 lb 12.8 oz (107.4 kg)   LMP 12/15/2022 Comment: spotting  BMI 40.02 kg/m   Pleasant, well-appearing female, in mild-mod discomfort when touching the left neck and looking to the right.   HEENT: conjunctiva and sclera are clear, EOMI. TM's and EAC's normal. Nasal mucosa is mild-mod edematous on the right, no purulence.  Severely edematous on the left, with +erythema, no  purulence. Tender at L maxillary sinus.  Nontender at frontal sinus. OP is clear. Neck: Thyroid--slightly enlarged on left side, no discrete nodule. Slightly tender over L anterior cervical chain. 1.5 x 3.5 cm area of induration and tenderness at L neck, along the SCM muscle No overlying erythema or warmth. No fluctuance. Skin is normal, no pore or opening or rash Also has some muscular tenderness at the lower neck muscles on the neck  Heart: regular rate and rhythm Lung: clear   ASSESSMENT/PLAN:  Left maxillary sinusitis - cont supportive measures, add augmentin - Plan: amoxicillin-clavulanate (AUGMENTIN) 875-125 MG tablet  Mass of left side of neck - unclear etiology--shape not c/w LAD.  Concern for poss abscess since no trauma to suspect hematoma. Cont ice, ABX, f/u Monday. ER if worse   Thyroid US is scheduled for 4/15  You have a sinus infection (on the left, affecting your cheek). Continue the mucinex. Consider sinus rinses. Take the antibiotic twice daily for 10 days.  The lump on your left neck--doesn't feel like a swollen gland (it is longer, and a different shape than a swollen lymph node).  But, given that you do have an infection, let's try the antibiotics, continue use of advil and ice, and follow-up next week if not improving.  We may need to check some blood tests and do a CT to see what is going on in the neck.  If you develop worsening swelling, high fever, trouble breathing or swallowing, trouble moving your neck, then go to the ER (they can get the neck CT done sooner (and do bloodwork).

## 2022-12-25 ENCOUNTER — Encounter: Payer: Self-pay | Admitting: Family Medicine

## 2022-12-25 MED ORDER — FLUCONAZOLE 150 MG PO TABS: 150.0000 mg | ORAL_TABLET | Freq: Once | ORAL | 0 refills | Status: AC

## 2023-01-05 ENCOUNTER — Ambulatory Visit
Admission: RE | Admit: 2023-01-05 | Discharge: 2023-01-05 | Disposition: A | Discharge status: Home / Self Care | Payer: BC Managed Care – PPO | Source: Ambulatory Visit | Attending: Family Medicine | Admitting: Family Medicine

## 2023-01-05 DIAGNOSIS — E041 Nontoxic single thyroid nodule: Secondary | ICD-10-CM

## 2023-01-12 ENCOUNTER — Other Ambulatory Visit: Payer: Self-pay | Admitting: Family Medicine

## 2023-01-12 DIAGNOSIS — Z1231 Encounter for screening mammogram for malignant neoplasm of breast: Secondary | ICD-10-CM

## 2023-01-14 ENCOUNTER — Ambulatory Visit
Admission: RE | Admit: 2023-01-14 | Discharge: 2023-01-14 | Disposition: A | Discharge status: Home / Self Care | Payer: BC Managed Care – PPO | Source: Ambulatory Visit | Attending: Family Medicine | Admitting: Family Medicine

## 2023-01-14 DIAGNOSIS — Z1231 Encounter for screening mammogram for malignant neoplasm of breast: Secondary | ICD-10-CM

## 2023-01-19 ENCOUNTER — Encounter: Payer: Self-pay | Admitting: Family Medicine

## 2023-01-20 ENCOUNTER — Other Ambulatory Visit: Payer: Self-pay | Admitting: *Deleted

## 2023-01-20 DIAGNOSIS — E041 Nontoxic single thyroid nodule: Secondary | ICD-10-CM

## 2023-02-18 ENCOUNTER — Other Ambulatory Visit: Payer: Self-pay | Admitting: Surgery

## 2023-02-20 ENCOUNTER — Other Ambulatory Visit (HOSPITAL_COMMUNITY): Payer: Self-pay | Admitting: Surgery

## 2023-02-20 DIAGNOSIS — R1314 Dysphagia, pharyngoesophageal phase: Secondary | ICD-10-CM

## 2023-02-20 DIAGNOSIS — E041 Nontoxic single thyroid nodule: Secondary | ICD-10-CM

## 2023-03-02 ENCOUNTER — Ambulatory Visit (HOSPITAL_COMMUNITY): Payer: BC Managed Care – PPO

## 2023-03-06 ENCOUNTER — Ambulatory Visit (HOSPITAL_COMMUNITY)
Admission: RE | Admit: 2023-03-06 | Discharge: 2023-03-06 | Disposition: A | Discharge status: Home / Self Care | Payer: BC Managed Care – PPO | Source: Ambulatory Visit | Attending: Surgery | Admitting: Surgery

## 2023-03-06 DIAGNOSIS — E041 Nontoxic single thyroid nodule: Secondary | ICD-10-CM | POA: Diagnosis present

## 2023-03-06 DIAGNOSIS — R1314 Dysphagia, pharyngoesophageal phase: Secondary | ICD-10-CM

## 2023-03-06 LAB — POCT I-STAT CREATININE: Creatinine, Ser: 0.9 mg/dL (ref 0.44–1.00)

## 2023-03-06 MED ORDER — IOHEXOL 300 MG/ML  SOLN
75.0000 mL | Freq: Once | INTRAMUSCULAR | Status: AC | PRN
  Administered 2023-03-06: 75 mL via INTRAVENOUS

## 2023-03-06 MED ORDER — SODIUM CHLORIDE (PF) 0.9 % IJ SOLN
INTRAMUSCULAR | Status: AC
  Filled 2023-03-06: qty 50

## 2023-03-10 ENCOUNTER — Other Ambulatory Visit (HOSPITAL_COMMUNITY): Payer: Self-pay

## 2023-03-10 ENCOUNTER — Other Ambulatory Visit: Payer: Self-pay | Admitting: Family Medicine

## 2023-03-10 DIAGNOSIS — F988 Other specified behavioral and emotional disorders with onset usually occurring in childhood and adolescence: Secondary | ICD-10-CM

## 2023-03-10 MED ORDER — AMPHETAMINE-DEXTROAMPHETAMINE 20 MG PO TABS
20.0000 mg | ORAL_TABLET | Freq: Two times a day (BID) | ORAL | 0 refills | Status: DC
Start: 2023-03-10 — End: 2023-07-01
  Filled 2023-03-10: qty 60, 30d supply, fill #0

## 2023-03-10 NOTE — Telephone Encounter (Signed)
Is this okay to refill? 

## 2023-06-30 NOTE — Progress Notes (Unsigned)
No chief complaint on file.   Crystal [name] is a 57 y.o. female who presents for a complete physical and follow-up on chronic problems.   She has the following concerns:   Diabetes. A1c was 6.4% on last check in 09/2022.  At that time she was taking Trulicity 0.75mg  and Metformin 2000mg  daily (with dinner). She denies side effects to medications.  SAME MEDS??  She tries to follow a low sugar, low-carb diet, but reports isn't always as good as she should be. She snacks some, has some "mini" regular sodas, but not frequently.  She continues to use smaller plates for smaller portions. Drinks 100% juice, 4-8 ounces daily (grape, apple or orange juice). No longer drinks [employer].  Sugars are running   Denies hypoglycemia, polydipsia, polyuria. She denies numbness/tingling/burning or sores in her feet. Last diabetic eye exam was 09/2022, no retinopathy.     Left thyroid nodule--she underwent L thyroid biopsy in 10/2020, results were inconclusive, insufficient cells.  Options given to pt included re-try in 3 mos, vs surgical excision of L thyroid, vs recheck [place]. She was referred to Dr. Magnus Ivan, planned for surgery, was delayed due to her back surgery.  Nodule got smaller.  This past spring she noticed it seemed a little larger, also noticed some other swelling in the neck related to an acute URI. Ultrasound was repeated in April 2024: IMPRESSION: Stable solitary, previously biopsied, 1.9 cm LEFT mid thyroid nodule Assuming a benign pathologic diagnosis, repeat sampling and/or dedicated follow-up is not recommended.  She sent [place] a message stating she wanted to have it removed surgically, so was referred back to Dr. Magnus Ivan. She saw him in May 2024, and stated that given the increase size of the thyroid nodule, the recent infection in her neck, and her family history of thyroid cancer, he recommended CT of the neck to evaluate the thyroid gland and surrounding adenopathy as well as other causes  of her difficulty swallowing.  CT was done in 02/2023 and stated IMPRESSION: Unremarkable CT of the neck.  No evidence of mass or adenopathy. Previously biopsied thyroid nodule, better characterized on prior ultrasound of the neck from January 05, 2023  Dr. Magnus Ivan referred her to ENT, and she saw Dr. Pollyann Kennedy in 03/2023.  He did not feel that any further treatment was indicated.   Hypertension-- She reports compliance with her medications (losartan-HCT), denies side effects.   BP's have been running   Denies headaches (had sinus headaches, which improved with Flonase/allergy meds), dizziness, chest pain, muscle cramps. Denies edema.  BP Readings from Last 3 Encounters:  12/17/22 130/70  12/08/22 120/70  10/06/22 120/78      Hyperlipidemia: She is tolerating daily Crestor without side effects (previously didn't tolerate atorvastatin due to myalgias).  Lipids were at goal on last check. Lab Results  Component Value Date   CHOL 117 12/08/2022   HDL 38 (L) 12/08/2022   LDLCALC 63 12/08/2022   TRIG 82 12/08/2022   CHOLHDL 3.1 12/08/2022   ADHD: She is taking Adderall once daily, during the week only.   ??? twice daily, just a half tablet in the afternoon. Overall, this medication is effective and she denies side effects.  She reports once daily was enough when not working. Last filled #60 on 03/10/23.   H/o vitamin D deficiency--low in 2016-2017, normal since 2018, when taking 2000 IU daily. She continues to take 2000 IU daily.   Iron deficiency anemia: She is s/p EGD and Colonoscopy in August,  2018 (reactive gastropathy, inflammation in stomach, internal hemorrhoids and diverticulosis on colonoscopy). Gastroenterologist did not see a GI source for iron deficiency, suspected related to menstrual losses.  She does have fibroids. She is under the care of Dr. Edward Jolly.   Her cycles now are very light, mainly spotting, once a month.  No further heavy cycles or cramps. ***UPDATE  She hasn't taken  any iron in over a year. Denies fatigue or dizziness, denies hot flashes or night sweats.    Lab Results  Component Value Date   WBC 6.6 06/05/2022   HGB 12.9 06/05/2022   HCT 38.1 06/05/2022   MCV 86 06/05/2022   PLT 266 06/05/2022   Lab Results  Component Value Date   IRON 24 (L) 04/03/2014   FERRITIN 31 11/25/2021   She had sleep study done 06/2022: IMPRESSIONS - Minimal obstructive sleep apnea occurred during this study (AHI = 5.0/h). At upper limit of normal. Clinical impact is unlikely. - The patient had minimal or no oxygen desaturation during the study (Min O2 = 91%) - Patient snored. - Treatment would be based on clinical judgment. Suggest maintaining normal weight and try to sleep off flat of back.  Immunization History  Administered Date(s) Administered   Influenza Whole 06/02/2011   Influenza, Seasonal, Injecte, Preservative Fre 10/21/2012   Influenza,inj,Quad PF,6+ Mos 06/09/2013, 07/03/2014, 07/09/2015, 06/23/2016, 09/02/2017, 06/16/2018, 06/29/2019, 05/23/2021, 06/05/2022   Influenza-Unspecified 07/14/2020   PFIZER Comirnaty(Gray Top)Covid-19 Tri-Sucrose Vaccine 01/16/2021   PFIZER(Purple Top)SARS-COV-2 Vaccination 01/14/2020, 02/04/2020   Pfizer(Comirnaty)Fall Seasonal Vaccine 12 years and older 10/06/2022   Pneumococcal Polysaccharide-23 10/21/2012   Tdap 09/20/2013   Zoster Recombinant(Shingrix) 07/02/2018, 09/13/2018   Last Pap smear: 12/2017 with Dr. Edward Jolly, normal.  Last mammogram: 12/2022 Last colonoscopy: 04/2017 Last DEXA: never Dentist: every 6 months. Ophtho: yearly, last 09/2022 Exercise:  goes to the [facility] 3x/week--weights and cardio machines (30 minutes). No longer doing water exercises (due to work schedule).    PMH, PSH, SH and FH were reviewed and updated      ROS:  The patient denies anorexia, fever, vision changes, decreased hearing, ear pain, sore throat, chest pain, palpitations, dizziness, syncope, dyspnea on exertion,  cough, swelling, nausea, vomiting, diarrhea, constipation, abdominal pain, melena, indigestion/heartburn, hematuria, incontinence, dysuria, vaginal discharge, odor or itch, genital lesions, numbness, tingling, weakness, tremor, suspicious skin lesions, depression, anxiety, abnormal bleeding/bruising, or enlarged lymph nodes.  Menses--monthly, lighter, mostly spotting. UPDATE *** Denies hot flashes, night sweats.  Allergies? Carpal tunnel syndrome? Trouble swallowing? No knee pain or leg cramps? Back pain?     PHYSICAL EXAM:  There were no vitals taken for this visit.  Wt Readings from Last 3 Encounters:  12/17/22 236 lb 12.8 oz (107.4 kg)  12/08/22 238 lb (108 kg)  10/06/22 244 lb 3.2 oz (110.8 kg)    General Appearance:     Alert, cooperative, no distress, appears stated age. She elected not to change into a gown for today's exam.  Head:     Normocephalic, without obvious abnormality, atraumatic   Eyes:     PERRL, conjunctiva/corneas clear, EOM's intact, fundi benign   Ears:     Normal TM's and external ear canals   Nose:    No drainage or sinus tenderness  Throat:    Normal, no lesions  Neck:    Supple, no lymphadenopathy;  thyroid: nontender, mild diffuse enlargement, no discrete mass/nodule appreciated; no carotid bruit or JVD   Back:     No spinal or CVA tenderness.  Lungs:  Clear to auscultation bilaterally without wheezes, rales or ronchi; respirations unlabored   Chest Wall:     No tenderness or deformity    Heart:     Regular rate and rhythm, S1 and S2 normal, no murmur, rub or gallop   Breast Exam:     Deferred to GYN  Abdomen:      Soft, obese, non-tender, nondistended, normoactive bowel sounds,  no masses, no hepatosplenomegaly   Genitalia:     Deferred to GYN        Extremities:    No clubbing, cyanosis or edema.   Pulses:    2+ and symmetric all extremities   Skin:    Skin color, texture, turgor normal, no rashes or lesions, though skin exam is limited due  to not changing into gown.  Normal skin exam of feet, normal monofilament exam.  Lymph nodes:    Cervical, supraclavicular nodes normal   Neurologic:    Normal strength, sensation and gait; reflexes slightly diminished at knees, less on R than L                     Psych:   Normal mood, affect, hygiene and grooming   Normal diabetic foot exam  Lab Results  Component Value Date   HGBA1C 6.4 (A) 10/06/2022    ***DIABETIC FOOT EXAM Update reflexes   ASSESSMENT/PLAN:   TAKING TRULICITY????? Was taking it in March when she was last seen, shouldn't have had enough left. If not taking--WHEN and WHY? Need RF??  Adderall--taking 1 in am and 1/2 in pm? Need refill??  SHE IS DUE FOR PAP SMEAR THIS YEAR, AND DOESN'T HAVE GYN VISIT SCHEDULED (LAST 12/2021) -- SHE NEEDS TO SCHEDULE GYN  Discussed monthly self breast exams and yearly mammograms; at least 30 minutes of aerobic activity at least 5 days/week; weight bearing exercise at least 2x/week; proper sunscreen use reviewed; healthy diet, including goals of calcium and vitamin D intake and alcohol recommendations (less than or equal to 1 drink/day) reviewed; regular seatbelt use; changing batteries in smoke detectors.  Immunization recommendations discussed. Continue yearly flu shots, given today.  COVID booster   Colonoscopy recommendations reviewed, UTD Past due for GYN exam and pap smear, reminded to schedule with GYN  F/u 6 mos med check (fasting), sooner prn.

## 2023-07-01 ENCOUNTER — Other Ambulatory Visit (HOSPITAL_COMMUNITY): Payer: Self-pay

## 2023-07-01 ENCOUNTER — Encounter: Payer: Self-pay | Admitting: Family Medicine

## 2023-07-01 ENCOUNTER — Ambulatory Visit: Payer: BC Managed Care – PPO | Admitting: Family Medicine

## 2023-07-01 VITALS — BP 132/82 | HR 84 | Ht 64.5 in | Wt 240.0 lb

## 2023-07-01 DIAGNOSIS — E1159 Type 2 diabetes mellitus with other circulatory complications: Secondary | ICD-10-CM | POA: Diagnosis not present

## 2023-07-01 DIAGNOSIS — E559 Vitamin D deficiency, unspecified: Secondary | ICD-10-CM

## 2023-07-01 DIAGNOSIS — Z23 Encounter for immunization: Secondary | ICD-10-CM

## 2023-07-01 DIAGNOSIS — D509 Iron deficiency anemia, unspecified: Secondary | ICD-10-CM

## 2023-07-01 DIAGNOSIS — I10 Essential (primary) hypertension: Secondary | ICD-10-CM

## 2023-07-01 DIAGNOSIS — F988 Other specified behavioral and emotional disorders with onset usually occurring in childhood and adolescence: Secondary | ICD-10-CM

## 2023-07-01 DIAGNOSIS — Z0000 Encounter for general adult medical examination without abnormal findings: Secondary | ICD-10-CM | POA: Diagnosis not present

## 2023-07-01 DIAGNOSIS — E785 Hyperlipidemia, unspecified: Secondary | ICD-10-CM

## 2023-07-01 DIAGNOSIS — E1169 Type 2 diabetes mellitus with other specified complication: Secondary | ICD-10-CM | POA: Diagnosis not present

## 2023-07-01 DIAGNOSIS — E041 Nontoxic single thyroid nodule: Secondary | ICD-10-CM

## 2023-07-01 DIAGNOSIS — I152 Hypertension secondary to endocrine disorders: Secondary | ICD-10-CM

## 2023-07-01 LAB — POCT URINALYSIS DIP (PROADVANTAGE DEVICE)
Bilirubin, UA: NEGATIVE
Blood, UA: NEGATIVE
Glucose, UA: NEGATIVE mg/dL
Ketones, POC UA: NEGATIVE mg/dL
Leukocytes, UA: NEGATIVE
Nitrite, UA: NEGATIVE
Protein Ur, POC: NEGATIVE mg/dL
Specific Gravity, Urine: 1.02
Urobilinogen, Ur: 0.2
pH, UA: 6 (ref 5.0–8.0)

## 2023-07-01 LAB — POCT GLYCOSYLATED HEMOGLOBIN (HGB A1C): Hemoglobin A1C: 6.6 % — AB (ref 4.0–5.6)

## 2023-07-01 MED ORDER — METFORMIN HCL ER 500 MG PO TB24
ORAL_TABLET | ORAL | 1 refills | Status: DC
Start: 2023-07-01 — End: 2023-12-31

## 2023-07-01 MED ORDER — AMPHETAMINE-DEXTROAMPHETAMINE 20 MG PO TABS
20.0000 mg | ORAL_TABLET | Freq: Two times a day (BID) | ORAL | 0 refills | Status: DC
Start: 2023-07-01 — End: 2023-10-10
  Filled 2023-07-01: qty 40, 20d supply, fill #0
  Filled 2023-07-01: qty 20, 10d supply, fill #0

## 2023-07-01 MED ORDER — LOSARTAN POTASSIUM-HCTZ 100-25 MG PO TABS
1.0000 | ORAL_TABLET | Freq: Every day | ORAL | 1 refills | Status: DC
Start: 2023-07-01 — End: 2023-12-30

## 2023-07-01 NOTE — Patient Instructions (Addendum)
  HEALTH MAINTENANCE RECOMMENDATIONS:  It is recommended that you get at least 30 minutes of aerobic exercise at least 5 days/week (for weight loss, you may need as much as 60-90 minutes). This can be any activity that gets your heart rate up. This can be divided in 10-15 minute intervals if needed, but try and build up your endurance at least once a week.  Weight bearing exercise is also recommended twice weekly.  Eat a healthy diet with lots of vegetables, fruits and fiber.  "Colorful" foods have a lot of vitamins (ie green vegetables, tomatoes, red peppers, etc).  Limit sweet tea, regular sodas and alcoholic beverages, all of which has a lot of calories and sugar.  Up to 1 alcoholic drink daily may be beneficial for women (unless trying to lose weight, watch sugars).  Drink a lot of water.  Calcium recommendations are 1200-1500 mg daily (1500 mg for postmenopausal women or women without ovaries), and vitamin D 1000 IU daily.  This should be obtained from diet and/or supplements (vitamins), and calcium should not be taken all at once, but in divided doses.  Monthly self breast exams and yearly mammograms for women over the age of 75 is recommended.  Sunscreen of at least SPF 30 should be used on all sun-exposed parts of the skin when outside between the hours of 10 am and 4 pm (not just when at beach or pool, but even with exercise, golf, tennis, and yard work!)  Use a sunscreen that says "broad spectrum" so it covers both UVA and UVB rays, and make sure to reapply every 1-2 hours.  Remember to change the batteries in your smoke detectors when changing your clock times in the spring and fall. Carbon monoxide detectors are recommended for your home.  Use your seat belt every time you are in a car, and please drive safely and not be distracted with cell phones and texting while driving.  Please schedule your visit with Dr. Edward Jolly.  If your yearly visit is too far out, you can schedule an acute visit  to evaluate your urinary and pelvic complaints.  Do your best to make healthy choices when eating fast food.  Skip the fries.   TdaP (tetanus booster) is due this year (technically 08/2023) Pneumonia shot booster is also due (due to being a diabetic, last was 09/2012). Please schedule nurse visits for these, at your convenience. Wait 2 weeks from today's vaccines.  Please look into getting counseling (through your insurance or EAP program through work).

## 2023-07-02 ENCOUNTER — Encounter: Payer: Self-pay | Admitting: Family Medicine

## 2023-07-02 LAB — CMP14+EGFR
ALT: 14 [IU]/L (ref 0–32)
AST: 16 [IU]/L (ref 0–40)
Albumin: 4.4 g/dL (ref 3.8–4.9)
Alkaline Phosphatase: 73 [IU]/L (ref 44–121)
BUN/Creatinine Ratio: 15 (ref 9–23)
BUN: 13 mg/dL (ref 6–24)
Bilirubin Total: 0.4 mg/dL (ref 0.0–1.2)
CO2: 26 mmol/L (ref 20–29)
Calcium: 9.9 mg/dL (ref 8.7–10.2)
Chloride: 98 mmol/L (ref 96–106)
Creatinine, Ser: 0.87 mg/dL (ref 0.57–1.00)
Globulin, Total: 2.8 g/dL (ref 1.5–4.5)
Glucose: 110 mg/dL — ABNORMAL HIGH (ref 70–99)
Potassium: 3.5 mmol/L (ref 3.5–5.2)
Sodium: 138 mmol/L (ref 134–144)
Total Protein: 7.2 g/dL (ref 6.0–8.5)
eGFR: 79 mL/min/{1.73_m2} (ref >59)

## 2023-07-02 LAB — CBC WITH DIFFERENTIAL/PLATELET
Basophils Absolute: 0 10*3/uL (ref 0.0–0.2)
Basos: 1 %
EOS (ABSOLUTE): 0.2 10*3/uL (ref 0.0–0.4)
Eos: 4 %
Hematocrit: 40.1 % (ref 34.0–46.6)
Hemoglobin: 13 g/dL (ref 11.1–15.9)
Immature Grans (Abs): 0 10*3/uL (ref 0.0–0.1)
Immature Granulocytes: 0 %
Lymphocytes Absolute: 1.8 10*3/uL (ref 0.7–3.1)
Lymphs: 29 %
MCH: 28 pg (ref 26.6–33.0)
MCHC: 32.4 g/dL (ref 31.5–35.7)
MCV: 86 fL (ref 79–97)
Monocytes Absolute: 0.3 10*3/uL (ref 0.1–0.9)
Monocytes: 5 %
Neutrophils Absolute: 3.8 10*3/uL (ref 1.4–7.0)
Neutrophils: 61 %
Platelets: 300 10*3/uL (ref 150–450)
RBC: 4.64 x10E6/uL (ref 3.77–5.28)
RDW: 13.9 % (ref 11.7–15.4)
WBC: 6.2 10*3/uL (ref 3.4–10.8)

## 2023-07-02 LAB — TSH: TSH: 1.38 u[IU]/mL (ref 0.450–4.500)

## 2023-07-02 LAB — MICROALBUMIN / CREATININE URINE RATIO
Creatinine, Urine: 116.4 mg/dL
Microalb/Creat Ratio: 4 mg/g{creat} (ref 0–29)
Microalbumin, Urine: 5 ug/mL

## 2023-07-02 MED ORDER — ONETOUCH VERIO VI STRP: ORAL_STRIP | 3 refills | Status: DC

## 2023-07-02 MED ORDER — ONETOUCH DELICA PLUS LANCET33G MISC: 3 refills | Status: DC

## 2023-07-15 ENCOUNTER — Other Ambulatory Visit (INDEPENDENT_AMBULATORY_CARE_PROVIDER_SITE_OTHER): Payer: BC Managed Care – PPO

## 2023-07-15 DIAGNOSIS — Z23 Encounter for immunization: Secondary | ICD-10-CM | POA: Diagnosis not present

## 2023-07-29 ENCOUNTER — Other Ambulatory Visit (INDEPENDENT_AMBULATORY_CARE_PROVIDER_SITE_OTHER): Payer: BC Managed Care – PPO

## 2023-07-29 DIAGNOSIS — Z23 Encounter for immunization: Secondary | ICD-10-CM

## 2023-10-10 ENCOUNTER — Other Ambulatory Visit: Payer: Self-pay | Admitting: Family Medicine

## 2023-10-10 DIAGNOSIS — F988 Other specified behavioral and emotional disorders with onset usually occurring in childhood and adolescence: Secondary | ICD-10-CM

## 2023-10-12 ENCOUNTER — Other Ambulatory Visit (HOSPITAL_COMMUNITY): Payer: Self-pay

## 2023-10-12 MED ORDER — AMPHETAMINE-DEXTROAMPHETAMINE 20 MG PO TABS
20.0000 mg | ORAL_TABLET | Freq: Two times a day (BID) | ORAL | 0 refills | Status: DC
Start: 2023-10-12 — End: 2023-12-30
  Filled 2023-10-12: qty 60, 30d supply, fill #0

## 2023-10-12 NOTE — Telephone Encounter (Signed)
Is this okay to refill? 

## 2023-10-19 LAB — HM DIABETES EYE EXAM

## 2023-10-20 ENCOUNTER — Encounter: Payer: Self-pay | Admitting: Family Medicine

## 2023-10-20 ENCOUNTER — Other Ambulatory Visit (HOSPITAL_COMMUNITY): Payer: Self-pay

## 2023-11-17 ENCOUNTER — Ambulatory Visit: Payer: BC Managed Care – PPO | Admitting: Obstetrics and Gynecology

## 2023-12-30 NOTE — Progress Notes (Unsigned)
 No chief complaint on file.  Patient presents for 6 month f/u on chronic problems.  Diabetes. A1c was 6.6% on last check in 06/2023, on metformin 2000 mg daily. It was 6.4% the time prior, when taking Trulicity 0.75mg  and Metformin 2000mg  daily (with dinner).  She stopped taking Trulicity in April 2024 after concern about neck swelling.  L neck had swollen, had an infection at that time, also has thyroid nodule that has been monitored (and stable). She just got very concerned about the neck swelling, concerned it was related to the Trulicity, and preferred to stop taking Trulicity. She has had no further neck swelling. At her physical in October, she reported being stressed (related to raising a 56 yo). She was having more fast food, eating more fried foods and junk.  Today she reports ***DIET   She continues to use smaller plates for smaller portions. Drinks 100% juice, 4-8 ounces daily (grape, apple or orange juice). She mostly drinks water. No longer has mini sodas.   Sugars are running ***  Denies hypoglycemia, polydipsia, polyuria. She denies numbness/tingling/burning or sores in her feet. Last diabetic eye exam was 09/2023, no retinopathy.   Stress--her daughter abandoned her 1 yo, so she is now raising her. She is angry at her daughter for doing this. ***UPDATE She is in daycare, and her mother helps out a lot. She is trying to manage her stress. She walks in the morning between her routes, and participates in Women's bible study.  These help her moods.     Left thyroid nodule--she underwent L thyroid biopsy in 10/2020, results were inconclusive, insufficient cells.  Options given to pt included re-try in 3 mos, vs surgical excision of L thyroid, vs recheck [place]. She was referred to Dr. Magnus Ivan, planned for surgery, was delayed due to her back surgery.  Ultrasound was repeated in April 2024 and nodule was stable.   She saw Dr. Magnus Ivan in 01/2023, who ordered CT, done 02/2023,  unremarkable otherwise.  She then saw Dr. Autumn Patty 03/2023, who didn't feel that any further treatment was indicated. She currently denies any neck mass, swelling, nodule or trouble swallowing.    Hypertension-- She reports compliance with her medications (losartan-HCT), denies side effects.   BP's have been running ***  Denies headaches, dizziness, chest pain. Denies edema. Some muscle cramps, relieved by mustard prn (related to sweating a lot on the bus, [facility] not working) ***UPDATE   BP Readings from Last 3 Encounters:  07/01/23 132/82  12/17/22 130/70  12/08/22 120/70      Hyperlipidemia: She is tolerating daily Crestor without side effects (previously didn't tolerate atorvastatin due to myalgias).  Lipids were at goal on last check, due for recheck.  Lab Results  Component Value Date   CHOL 117 12/08/2022   HDL 38 (L) 12/08/2022   LDLCALC 63 12/08/2022   TRIG 82 12/08/2022   CHOLHDL 3.1 12/08/2022     ADHD: She is taking Adderall during the week only.  Taking 1 pill in the morning, 1/2 in the afternoon; takes a full tablet in the afternoon only when she needs to read/focus (ie bible study), about 3x/week. Overall, this medication is effective and she denies side effects.  Last filled #60 on 10/12/23.    Iron deficiency anemia: She is s/p EGD and Colonoscopy in August, 2018 (reactive gastropathy, inflammation in stomach, internal hemorrhoids and diverticulosis on colonoscopy). Gastroenterologist did not see a GI source for iron deficiency, suspected related to menstrual losses.  She does have  fibroids. CBC was normal in 06/2023, hgb 13. In October she reported going from 10/2022 until Aug/Sept without any bleeding, then had some bleeding, and again at her physical (06/2023). She wasn't having hot flashes. She was reminded to schedule f/u with GYN, was past due. She had to cancel 10/2023 visit, but is rescheduled for May.  ***UPDATE BLEEDING    PMH, PSH, SH reviewed   ROS: no  fever, chills, dizziness, chest pain, shortness of breath, nausea, vomiting, diarrhea. Has some constipation.  Denies urinary symptoms. Vaginal bleeding?  Hot flashes? Moods weight     PHYSICAL EXAM:  There were no vitals taken for this visit.  Wt Readings from Last 3 Encounters:  07/01/23 240 lb (108.9 kg)  12/17/22 236 lb 12.8 oz (107.4 kg)  12/08/22 238 lb (108 kg)   Well-appearing, in no distress HEENT: conjunctiva and sclera are clear, EOMI, OP clear. Sinuses are nontender Neck: no lymphadenopathy, thyromegaly or mass, no bruit. L lobe of thyroid is mildly enlarged, nontender. Heart: regular rate and rhythm Lungs: clear bilaterally Abdomen: soft, nontender, no mass Extremities: normal pulses, no edema. Neuro: alert and oriented. Cranial nerves grossly intact. Normal gait. Psych: Normal mood, affect, normal eye contact, speech, hygiene and grooming    ***UPDATE thyroid   ASSESSMENT/PLAN:  Prevnar-20 (vs at next CPE if declines today).  ***REFILL CRESTOR AFTER LABS BACK    Schedule CPE 6 mos

## 2023-12-31 ENCOUNTER — Ambulatory Visit (INDEPENDENT_AMBULATORY_CARE_PROVIDER_SITE_OTHER): Payer: BC Managed Care – PPO | Admitting: Family Medicine

## 2023-12-31 ENCOUNTER — Encounter: Payer: Self-pay | Admitting: Family Medicine

## 2023-12-31 ENCOUNTER — Other Ambulatory Visit (HOSPITAL_COMMUNITY): Payer: Self-pay

## 2023-12-31 VITALS — BP 128/78 | HR 72 | Ht 64.5 in | Wt 238.0 lb

## 2023-12-31 DIAGNOSIS — I152 Hypertension secondary to endocrine disorders: Secondary | ICD-10-CM

## 2023-12-31 DIAGNOSIS — E1159 Type 2 diabetes mellitus with other circulatory complications: Secondary | ICD-10-CM | POA: Diagnosis not present

## 2023-12-31 DIAGNOSIS — Z5181 Encounter for therapeutic drug level monitoring: Secondary | ICD-10-CM | POA: Diagnosis not present

## 2023-12-31 DIAGNOSIS — Z23 Encounter for immunization: Secondary | ICD-10-CM

## 2023-12-31 DIAGNOSIS — E785 Hyperlipidemia, unspecified: Secondary | ICD-10-CM

## 2023-12-31 DIAGNOSIS — D509 Iron deficiency anemia, unspecified: Secondary | ICD-10-CM

## 2023-12-31 DIAGNOSIS — F988 Other specified behavioral and emotional disorders with onset usually occurring in childhood and adolescence: Secondary | ICD-10-CM | POA: Diagnosis not present

## 2023-12-31 DIAGNOSIS — N939 Abnormal uterine and vaginal bleeding, unspecified: Secondary | ICD-10-CM

## 2023-12-31 DIAGNOSIS — I10 Essential (primary) hypertension: Secondary | ICD-10-CM

## 2023-12-31 DIAGNOSIS — E041 Nontoxic single thyroid nodule: Secondary | ICD-10-CM

## 2023-12-31 DIAGNOSIS — R21 Rash and other nonspecific skin eruption: Secondary | ICD-10-CM

## 2023-12-31 DIAGNOSIS — E1169 Type 2 diabetes mellitus with other specified complication: Secondary | ICD-10-CM | POA: Diagnosis not present

## 2023-12-31 LAB — POCT GLYCOSYLATED HEMOGLOBIN (HGB A1C): Hemoglobin A1C: 6.3 % — AB (ref 4.0–5.6)

## 2023-12-31 LAB — LIPID PANEL

## 2023-12-31 MED ORDER — TRIAMCINOLONE ACETONIDE 0.1 % EX CREA
1.0000 | TOPICAL_CREAM | Freq: Two times a day (BID) | CUTANEOUS | 0 refills | Status: DC | PRN
Start: 2023-12-31 — End: 2024-04-27

## 2023-12-31 MED ORDER — LOSARTAN POTASSIUM-HCTZ 100-25 MG PO TABS
1.0000 | ORAL_TABLET | Freq: Every day | ORAL | 1 refills | Status: DC
Start: 2023-12-31 — End: 2024-07-03

## 2023-12-31 MED ORDER — METFORMIN HCL ER 500 MG PO TB24
ORAL_TABLET | ORAL | 1 refills | Status: DC
Start: 2023-12-31 — End: 2024-07-03

## 2023-12-31 MED ORDER — AMPHETAMINE-DEXTROAMPHETAMINE 20 MG PO TABS
20.0000 mg | ORAL_TABLET | Freq: Two times a day (BID) | ORAL | 0 refills | Status: DC
Start: 2023-12-31 — End: 2024-03-31
  Filled 2023-12-31: qty 10, 5d supply, fill #0
  Filled 2023-12-31: qty 50, 25d supply, fill #0

## 2023-12-31 NOTE — Patient Instructions (Signed)
 Try and check a bedtime blood sugar a couple of times/week. This is where you may see the impact of poor dietary choices, moreso than with the morning sugars.  Try and get back to avoiding all soda, drinking mainly water.  Keeping juices to a minimum, and preferably eating more fruit.  Try and find alternatives to eating when stressed.

## 2024-01-01 ENCOUNTER — Encounter: Payer: Self-pay | Admitting: Family Medicine

## 2024-01-01 LAB — CBC WITH DIFFERENTIAL/PLATELET
Basophils Absolute: 0 10*3/uL (ref 0.0–0.2)
Basos: 1 %
EOS (ABSOLUTE): 0.2 10*3/uL (ref 0.0–0.4)
Eos: 3 %
Hematocrit: 39.9 % (ref 34.0–46.6)
Hemoglobin: 13.1 g/dL (ref 11.1–15.9)
Immature Grans (Abs): 0 10*3/uL (ref 0.0–0.1)
Immature Granulocytes: 0 %
Lymphocytes Absolute: 2.1 10*3/uL (ref 0.7–3.1)
Lymphs: 31 %
MCH: 27.9 pg (ref 26.6–33.0)
MCHC: 32.8 g/dL (ref 31.5–35.7)
MCV: 85 fL (ref 79–97)
Monocytes Absolute: 0.4 10*3/uL (ref 0.1–0.9)
Monocytes: 7 %
Neutrophils Absolute: 3.9 10*3/uL (ref 1.4–7.0)
Neutrophils: 58 %
Platelets: 281 10*3/uL (ref 150–450)
RBC: 4.7 x10E6/uL (ref 3.77–5.28)
RDW: 14.2 % (ref 11.7–15.4)
WBC: 6.6 10*3/uL (ref 3.4–10.8)

## 2024-01-01 LAB — IRON,TIBC AND FERRITIN PANEL
Ferritin: 103 ng/mL (ref 15–150)
Iron Saturation: 16 % (ref 15–55)
Iron: 52 ug/dL (ref 27–159)
Total Iron Binding Capacity: 318 ug/dL (ref 250–450)
UIBC: 266 ug/dL (ref 131–425)

## 2024-01-01 LAB — LIPID PANEL
Cholesterol, Total: 113 mg/dL (ref 100–199)
HDL: 36 mg/dL — ABNORMAL LOW (ref >39)
LDL CALC COMMENT:: 3.1 ratio (ref 0.0–4.4)
LDL Chol Calc (NIH): 59 mg/dL (ref 0–99)
Triglycerides: 92 mg/dL (ref 0–149)
VLDL Cholesterol Cal: 18 mg/dL (ref 5–40)

## 2024-01-01 LAB — GLUCOSE, RANDOM: Glucose: 96 mg/dL (ref 70–99)

## 2024-01-01 LAB — FOLLICLE STIMULATING HORMONE: FSH: 56.7 m[IU]/mL

## 2024-01-01 MED ORDER — ROSUVASTATIN CALCIUM 5 MG PO TABS: 5.0000 mg | ORAL_TABLET | Freq: Every day | ORAL | 3 refills | Status: AC

## 2024-02-04 ENCOUNTER — Encounter: Payer: Self-pay | Admitting: Obstetrics and Gynecology

## 2024-02-04 ENCOUNTER — Other Ambulatory Visit (HOSPITAL_COMMUNITY)
Admission: RE | Admit: 2024-02-04 | Discharge: 2024-02-04 | Disposition: A | Discharge status: Home / Self Care | Source: Ambulatory Visit | Attending: Obstetrics and Gynecology | Admitting: Obstetrics and Gynecology

## 2024-02-04 ENCOUNTER — Ambulatory Visit (INDEPENDENT_AMBULATORY_CARE_PROVIDER_SITE_OTHER): Payer: BC Managed Care – PPO | Admitting: Obstetrics and Gynecology

## 2024-02-04 VITALS — BP 124/82 | HR 83 | Ht 65.0 in | Wt 241.0 lb

## 2024-02-04 DIAGNOSIS — Z124 Encounter for screening for malignant neoplasm of cervix: Secondary | ICD-10-CM

## 2024-02-04 DIAGNOSIS — Z01419 Encounter for gynecological examination (general) (routine) without abnormal findings: Secondary | ICD-10-CM | POA: Diagnosis not present

## 2024-02-04 DIAGNOSIS — Z113 Encounter for screening for infections with a predominantly sexual mode of transmission: Secondary | ICD-10-CM | POA: Diagnosis present

## 2024-02-04 DIAGNOSIS — Z114 Encounter for screening for human immunodeficiency virus [HIV]: Secondary | ICD-10-CM

## 2024-02-04 DIAGNOSIS — Z1331 Encounter for screening for depression: Secondary | ICD-10-CM | POA: Diagnosis not present

## 2024-02-04 DIAGNOSIS — N950 Postmenopausal bleeding: Secondary | ICD-10-CM | POA: Diagnosis not present

## 2024-02-04 DIAGNOSIS — Z1159 Encounter for screening for other viral diseases: Secondary | ICD-10-CM
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
## 2024-02-04 NOTE — Patient Instructions (Addendum)
 Endometrial Biopsy  An endometrial biopsy is a procedure where a tissue sample is removed from the lining of the uterus. This lining is called the endometrium. The tissue sample is then sent to a lab for testing. You may have this type of biopsy to check for: Cancer. Infection. Growths called polyps. Uterine bleeding that can't be explained. Tell a health care provider about: Any allergies you have. All medicines you're taking including vitamins, herbs, eye drops, creams, and over-the-counter medicines. Any problems you or family members have had with anesthesia. Any bleeding problems you have. Any surgeries you have had. Any medical problems you have. Whether you're pregnant or may be pregnant. What are the risks? Your health care provider will talk with you about risks. These may include: Bleeding. Infection. Allergic reactions to medicines. Damage to the wall of the uterus. This is rare. What happens before the procedure? Keep track of your period. You may need to have this biopsy when you're not having your period. Ask your provider about: Changing or stopping your regular medicines. These include any diabetes medicines or blood thinners you take. Taking medicines such as aspirin  and ibuprofen . These medicines can thin your blood. Do not take them unless your provider tells you to. Taking over-the-counter medicines, vitamins, herbs, and supplements. Bring a pad with you. You may need to wear one after the biopsy. Plan to have a responsible adult take you home from the hospital or clinic. You won't be allowed to drive. What happens during the procedure? A tool will be put into your vagina to hold it open. This helps your provider see the cervix. The cervix is the lowest part of the uterus. Your cervix will be cleaned with a solution that kills germs. You will be given anesthesia. This keeps you from feeling pain. It will numb your cervix. A tool called forceps will be used to  hold your cervix steady. A thin tool called a uterine sound will be put through your cervix. It will be used to: Find the length of your uterus. Find where to take the sample from. A soft tube called a catheter will be put into your uterus. The catheter will remove a tissue sample. The tube and tools will be removed. The sample will be sent to a lab for testing. The procedure may vary among providers and hospitals. What happens after the procedure? Your blood pressure, heart rate, breathing rate, and blood oxygen level will be monitored until you leave the hospital or clinic. It's up to you to get the results of your procedure. Ask your provider, or the department that is doing the procedure, when your results will be ready. This information is not intended to replace advice given to you by your health care provider. Make sure you discuss any questions you have with your health care provider. Document Revised: 11/18/2022 Document Reviewed: 11/18/2022 Elsevier Patient Education  2024 Elsevier Inc.  Uterine Bleeding After Menopause: What It Means Menopause is the end of a female's fertile years. After menopause, your ovaries can no longer release an egg. You'll no longer be able to get pregnant, and you'll no longer get a period. Any type of bleeding after menopause should be checked by a health care provider, as this is not normal. Treatment will depend on the cause of the bleeding. What can cause bleeding after menopause? Your provider may do tests to find out why you're bleeding. You may have bleeding if: You take hormones to treat the symptoms of menopause. The  lining of your uterus thins as a result of low estrogen. The lining of your uterus thickens as a result of too much estrogen. You have cancer in the uterus. You have growths in the uterus, such as: Polyps. Fibroids. Follow these instructions at home: Pay attention to any changes in your symptoms. Let your provider know about  them. If told by your provider: Avoid using tampons and douches. Get regular pelvic exams, including Pap tests. Take iron supplements. Change your pads regularly. Take your medicines only as told. Contact a health care provider if: You have pain in your belly. You have headaches. You have a fever or chills. You feel faint or dizzy. Get help right away if: You pass large blood clots. You have heavy bleeding that needs more than 1 pad an hour and you've never had this before. This information is not intended to replace advice given to you by your health care provider. Make sure you discuss any questions you have with your health care provider. Document Revised: 07/21/2023 Document Reviewed: 05/18/2023 Elsevier Patient Education  2024 Elsevier Inc.  EXERCISE AND DIET:  We recommended that you start or continue a regular exercise program for good health. Regular exercise means any activity that makes your heart beat faster and makes you sweat.  We recommend exercising at least 30 minutes per day at least 3 days a week, preferably 4 or 5.  We also recommend a diet low in fat and sugar.  Inactivity, poor dietary choices and obesity can cause diabetes, heart attack, stroke, and kidney damage, among others.    ALCOHOL AND SMOKING:  Women should limit their alcohol intake to no more than 7 drinks/beers/glasses of wine (combined, not each!) per week. Moderation of alcohol intake to this level decreases your risk of breast cancer and liver damage. And of course, no recreational drugs are part of a healthy lifestyle.  And absolutely no smoking or even second hand smoke. Most people know smoking can cause heart and lung diseases, but did you know it also contributes to weakening of your bones? Aging of your skin?  Yellowing of your teeth and nails?  CALCIUM  AND VITAMIN D :  Adequate intake of calcium  and Vitamin D  are recommended.  The recommendations for exact amounts of these supplements seem to change  often, but generally speaking 600 mg of calcium  (either carbonate or citrate) and 800 units of Vitamin D  per day seems prudent. Certain women may benefit from higher intake of Vitamin D .  If you are among these women, your doctor will have told you during your visit.    PAP SMEARS:  Pap smears, to check for cervical cancer or precancers,  have traditionally been done yearly, although recent scientific advances have shown that most women can have pap smears less often.  However, every woman still should have a physical exam from her gynecologist every year. It will include a breast check, inspection of the vulva and vagina to check for abnormal growths or skin changes, a visual exam of the cervix, and then an exam to evaluate the size and shape of the uterus and ovaries.  And after 56 years of age, a rectal exam is indicated to check for rectal cancers. We will also provide age appropriate advice regarding health maintenance, like when you should have certain vaccines, screening for sexually transmitted diseases, bone density testing, colonoscopy, mammograms, etc.   MAMMOGRAMS:  All women over 41 years old should have a yearly mammogram. Many facilities now offer a "3D"  mammogram, which may cost around $50 extra out of pocket. If possible,  we recommend you accept the option to have the 3D mammogram performed.  It both reduces the number of women who will be called back for extra views which then turn out to be normal, and it is better than the routine mammogram at detecting truly abnormal areas.    COLONOSCOPY:  Colonoscopy to screen for colon cancer is recommended for all women at age 71.  We know, you hate the idea of the prep.  We agree, BUT, having colon cancer and not knowing it is worse!!  Colon cancer so often starts as a polyp that can be seen and removed at colonscopy, which can quite literally save your life!  And if your first colonoscopy is normal and you have no family history of colon cancer,  most women don't have to have it again for 10 years.  Once every ten years, you can do something that may end up saving your life, right?  We will be happy to help you get it scheduled when you are ready.  Be sure to check your insurance coverage so you understand how much it will cost.  It may be covered as a preventative service at no cost, but you should check your particular policy.

## 2024-02-05 LAB — RPR: RPR Ser Ql: NONREACTIVE

## 2024-02-05 LAB — HEPATITIS C ANTIBODY: Hepatitis C Ab: NONREACTIVE

## 2024-02-05 LAB — HIV ANTIBODY (ROUTINE TESTING W REFLEX): HIV 1&2 Ab, 4th Generation: NONREACTIVE

## 2024-02-08 ENCOUNTER — Ambulatory Visit: Payer: Self-pay | Admitting: Obstetrics and Gynecology

## 2024-02-08 DIAGNOSIS — N950 Postmenopausal bleeding: Secondary | ICD-10-CM

## 2024-02-08 DIAGNOSIS — R87612 Low grade squamous intraepithelial lesion on cytologic smear of cervix (LGSIL): Secondary | ICD-10-CM

## 2024-02-08 LAB — CYTOLOGY - PAP
Chlamydia: NEGATIVE
Comment: NEGATIVE
Comment: NEGATIVE
Comment: NEGATIVE
Comment: NORMAL
High risk HPV: NEGATIVE
Neisseria Gonorrhea: NEGATIVE
Trichomonas: NEGATIVE
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
## 2024-03-09 ENCOUNTER — Ambulatory Visit (INDEPENDENT_AMBULATORY_CARE_PROVIDER_SITE_OTHER): Admitting: Obstetrics and Gynecology

## 2024-03-09 ENCOUNTER — Encounter: Payer: Self-pay | Admitting: Obstetrics and Gynecology

## 2024-03-09 ENCOUNTER — Ambulatory Visit

## 2024-03-09 VITALS — BP 124/82 | HR 55

## 2024-03-09 DIAGNOSIS — D219 Benign neoplasm of connective and other soft tissue, unspecified: Secondary | ICD-10-CM

## 2024-03-09 DIAGNOSIS — N950 Postmenopausal bleeding: Secondary | ICD-10-CM

## 2024-03-09 DIAGNOSIS — R9389 Abnormal findings on diagnostic imaging of other specified body structures: Secondary | ICD-10-CM

## 2024-03-09 DIAGNOSIS — R87612 Low grade squamous intraepithelial lesion on cytologic smear of cervix (LGSIL): Secondary | ICD-10-CM

## 2024-03-09 NOTE — Patient Instructions (Signed)
 Colposcopy, Care After  The following information offers guidance on how to care for yourself after your procedure. Your health care provider may also give you more specific instructions. If you have problems or questions, contact your health care provider. What can I expect after the procedure? If you had a colposcopy without a biopsy, you can expect to feel fine right away after your procedure. However, you may have some spotting of blood for a few days. You can return to your normal activities. If you had a colposcopy with a biopsy, it is common after the procedure to have: Soreness and mild pain. These may last for a few days. Mild vaginal bleeding or discharge that is dark-colored and grainy. This may last for a few days. The discharge may be caused by a liquid (solution) that was used during the procedure. You may need to wear a sanitary pad during this time. Spotting of blood for at least 48 hours after the procedure. Follow these instructions at home: Medicines Take over-the-counter and prescription medicines only as told by your health care provider. Talk with your health care provider about what type of over-the-counter pain medicines and prescription medicines you can start to take again. It is especially important to talk with your health care provider if you take blood thinners. Activity Avoid using douche products, using tampons, and having sex for at least 3 days after the procedure or for as long as told by your health care provider. Return to your normal activities as told by your health care provider. Ask your health care provider what activities are safe for you. General instructions Ask your health care provider if you may take baths, swim, or use a hot tub. You may take showers. If you use birth control (contraception), continue to use it. Keep all follow-up visits. This is important. Contact a health care provider if: You have a fever or chills. You faint or feel  light-headed. Get help right away if: You have heavy bleeding from your vagina or pass blood clots. Heavy bleeding is bleeding that soaks through a sanitary pad in less than 1 hour. You have vaginal discharge that is abnormal, is yellow in color, or smells bad. This could be a sign of infection. You have severe pain or cramps in your lower abdomen that do not go away with medicine. Summary If you had a colposcopy without a biopsy, you can expect to feel fine right away, but you may have some spotting of blood for a few days. You can return to your normal activities. If you had a colposcopy with a biopsy, it is common to have mild pain for a few days and spotting for 48 hours after the procedure. Avoid using douche products, using tampons, and having sex for at least 3 days after the procedure or for as long as told by your health care provider. Get help right away if you have heavy bleeding, severe pain, or signs of infection. This information is not intended to replace advice given to you by your health care provider. Make sure you discuss any questions you have with your health care provider. Document Revised: 02/03/2021 Document Reviewed: 02/03/2021 Elsevier Patient Education  2024 [employer] Inc[employer]Endometrial Biopsy  An endometrial biopsy is a procedure where a tissue sample is removed from the lining of the uterus. This lining is called the endometrium. The tissue sample is then sent to a lab for testing. You may have this type of biopsy to check for: Cancer. Infection. Growths called  polyps. Uterine bleeding that can't be explained. Tell a health care provider about: Any allergies you have. All medicines you're taking including vitamins, herbs, eye drops, creams, and over-the-counter medicines. Any problems you or family members have had with anesthesia. Any bleeding problems you have. Any surgeries you have had. Any medical problems you have. Whether you're pregnant or may be  pregnant. What are the risks? Your health care provider will talk with you about risks. These may include: Bleeding. Infection. Allergic reactions to medicines. Damage to the wall of the uterus. This is rare. What happens before the procedure? Keep track of your period. You may need to have this biopsy when you're not having your period. Ask your provider about: Changing or stopping your regular medicines. These include any diabetes medicines or blood thinners you take. Taking medicines such as aspirin  and ibuprofen . These medicines can thin your blood. Do not take them unless your provider tells you to. Taking over-the-counter medicines, vitamins, herbs, and supplements. Bring a pad with you. You may need to wear one after the biopsy. Plan to have a responsible adult take you home from the hospital or clinic. You won't be allowed to drive. What happens during the procedure? A tool will be put into your vagina to hold it open. This helps your provider see the cervix. The cervix is the lowest part of the uterus. Your cervix will be cleaned with a solution that kills germs. You will be given anesthesia. This keeps you from feeling pain. It will numb your cervix. A tool called forceps will be used to hold your cervix steady. A thin tool called a uterine sound will be put through your cervix. It will be used to: Find the length of your uterus. Find where to take the sample from. A soft tube called a catheter will be put into your uterus. The catheter will remove a tissue sample. The tube and tools will be removed. The sample will be sent to a lab for testing. The procedure may vary among providers and hospitals. What happens after the procedure? Your blood pressure, heart rate, breathing rate, and blood oxygen level will be monitored until you leave the hospital or clinic. It's up to you to get the results of your procedure. Ask your provider, or the department that is doing the procedure,  when your results will be ready. This information is not intended to replace advice given to you by your health care provider. Make sure you discuss any questions you have with your health care provider. Document Revised: 11/18/2022 Document Reviewed: 11/18/2022 Elsevier Patient Education  2024 [employer].
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
## 2024-03-22 ENCOUNTER — Encounter: Payer: Self-pay | Admitting: Obstetrics and Gynecology

## 2024-03-22 ENCOUNTER — Other Ambulatory Visit: Payer: Self-pay | Admitting: Family Medicine

## 2024-03-22 ENCOUNTER — Other Ambulatory Visit (HOSPITAL_COMMUNITY)
Admission: RE | Admit: 2024-03-22 | Discharge: 2024-03-22 | Disposition: A | Discharge status: Home / Self Care | Source: Ambulatory Visit | Attending: Obstetrics and Gynecology | Admitting: Obstetrics and Gynecology

## 2024-03-22 ENCOUNTER — Ambulatory Visit (INDEPENDENT_AMBULATORY_CARE_PROVIDER_SITE_OTHER): Admitting: Obstetrics and Gynecology

## 2024-03-22 VITALS — BP 130/84 | HR 70

## 2024-03-22 DIAGNOSIS — R87612 Low grade squamous intraepithelial lesion on cytologic smear of cervix (LGSIL): Secondary | ICD-10-CM

## 2024-03-22 DIAGNOSIS — R9389 Abnormal findings on diagnostic imaging of other specified body structures: Secondary | ICD-10-CM

## 2024-03-22 DIAGNOSIS — N950 Postmenopausal bleeding: Secondary | ICD-10-CM | POA: Diagnosis present

## 2024-03-22 DIAGNOSIS — Z01812 Encounter for preprocedural laboratory examination: Secondary | ICD-10-CM

## 2024-03-22 DIAGNOSIS — Z1231 Encounter for screening mammogram for malignant neoplasm of breast: Secondary | ICD-10-CM

## 2024-03-22 LAB — PREGNANCY, URINE: Preg Test, Ur: NEGATIVE

## 2024-03-22 NOTE — Patient Instructions (Addendum)
 Endometrial Biopsy  An endometrial biopsy is a procedure where a tissue sample is removed from the lining of the uterus. This lining is called the endometrium. The tissue sample is then sent to a lab for testing. You may have this type of biopsy to check for: Cancer. Infection. Growths called polyps. Uterine bleeding that can't be explained. Tell a health care provider about: Any allergies you have. All medicines you're taking including vitamins, herbs, eye drops, creams, and over-the-counter medicines. Any problems you or family members have had with anesthesia. Any bleeding problems you have. Any surgeries you have had. Any medical problems you have. Whether you're pregnant or may be pregnant. What are the risks? Your health care provider will talk with you about risks. These may include: Bleeding. Infection. Allergic reactions to medicines. Damage to the wall of the uterus. This is rare. What happens before the procedure? Keep track of your period. You may need to have this biopsy when you're not having your period. Ask your provider about: Changing or stopping your regular medicines. These include any diabetes medicines or blood thinners you take. Taking medicines such as aspirin and ibuprofen. These medicines can thin your blood. Do not take them unless your provider tells you to. Taking over-the-counter medicines, vitamins, herbs, and supplements. Bring a pad with you. You may need to wear one after the biopsy. Plan to have a responsible adult take you home from the hospital or clinic. You won't be allowed to drive. What happens during the procedure? A tool will be put into your vagina to hold it open. This helps your provider see the cervix. The cervix is the lowest part of the uterus. Your cervix will be cleaned with a solution that kills germs. You will be given anesthesia. This keeps you from feeling pain. It will numb your cervix. A tool called forceps will be used to  hold your cervix steady. A thin tool called a uterine sound will be put through your cervix. It will be used to: Find the length of your uterus. Find where to take the sample from. A soft tube called a catheter will be put into your uterus. The catheter will remove a tissue sample. The tube and tools will be removed. The sample will be sent to a lab for testing. The procedure may vary among providers and hospitals. What happens after the procedure? Your blood pressure, heart rate, breathing rate, and blood oxygen level will be monitored until you leave the hospital or clinic. It's up to you to get the results of your procedure. Ask your provider, or the department that is doing the procedure, when your results will be ready. This information is not intended to replace advice given to you by your health care provider. Make sure you discuss any questions you have with your health care provider. Document Revised: 11/18/2022 Document Reviewed: 11/18/2022 Elsevier Patient Education  2024 Elsevier Inc.  Colposcopy, Care After  The following information offers guidance on how to care for yourself after your procedure. Your doctor may also give you more specific instructions. If you have problems or questions, contact your doctor. What can I expect after the procedure? If you did not have a sample of your tissue taken out (did not have a biopsy), you may only have some spotting of blood for a few days. You can go back to your normal activities. If you had a sample of your tissue taken out, it is common to have: Soreness and mild pain. These may  last for a few days. Mild bleeding or fluid (discharge) coming from your vagina. The fluid will look dark and grainy. You may have this for a few days. The fluid may be caused by a liquid that was used during your procedure. You may need to wear a sanitary pad. Spotting of blood for at least 48 hours after the procedure. Follow these instructions at  home: Medicines Take over-the-counter and prescription medicines only as told by your doctor. Ask your doctor what over-the-counter pain medicines and prescription medicines you can start taking again. This is very important if you take blood thinners. Activity For at least 3 days, or for as long as told by your doctor, avoid: Douching. Using tampons. Having sex. Return to your normal activities as told by your doctor. Ask your doctor what activities are safe for you. General instructions Ask your doctor if you may take baths, swim, or use a hot tub. You may take showers. If you use birth control (contraception), keep using it. Keep all follow-up visits. Contact a doctor if: You have a fever or chills. You faint or feel light-headed. Get help right away if: You bleed a lot from your vagina. A lot of bleeding means that the bleeding soaks through a pad in less than 1 hour. You have clumps of blood (blood clots) coming from your vagina. You have signs that could mean you have an infection. This may be fluid coming from your vagina that is: Different than normal. Yellow. Bad-smelling. You have very bad pain or cramps in your lower belly that do not get better with medicine. Summary If you did not have a sample of your tissue taken out, you may only have some spotting of blood for a few days. You can go back to your normal activities. If you had a sample of your tissue taken out, it is common to have mild pain for a few days and spotting for 48 hours. Avoid douching, using tampons, and having sex for at least 3 days after the procedure or for as long as told. Get help right away if you have a lot of bleeding, very bad pain, or signs of infection. This information is not intended to replace advice given to you by your health care provider. Make sure you discuss any questions you have with your health care provider. Document Revised: 02/03/2021 Document Reviewed: 02/03/2021 Elsevier Patient  Education  2024 [employer].

## 2024-03-28 ENCOUNTER — Ambulatory Visit: Payer: Self-pay | Admitting: Obstetrics and Gynecology

## 2024-03-28 LAB — SURGICAL PATHOLOGY

## 2024-03-31 ENCOUNTER — Other Ambulatory Visit: Payer: Self-pay | Admitting: Family Medicine

## 2024-03-31 DIAGNOSIS — F988 Other specified behavioral and emotional disorders with onset usually occurring in childhood and adolescence: Secondary | ICD-10-CM

## 2024-03-31 MED ORDER — AMPHETAMINE-DEXTROAMPHETAMINE 20 MG PO TABS
20.0000 mg | ORAL_TABLET | Freq: Two times a day (BID) | ORAL | 0 refills | Status: DC
  Filled 2024-03-31: qty 60, 30d supply, fill #0

## 2024-03-31 NOTE — Telephone Encounter (Signed)
 Med refilled. Noted that she hasn't scheduled her CPE for 06/2024. Please schedule her physical. Thanks

## 2024-04-01 ENCOUNTER — Other Ambulatory Visit (HOSPITAL_COMMUNITY): Payer: Self-pay

## 2024-04-06 NOTE — Telephone Encounter (Signed)
 Physical scheduled for 07/04/2024

## 2024-04-08 ENCOUNTER — Ambulatory Visit
Admission: RE | Admit: 2024-04-08 | Discharge: 2024-04-08 | Disposition: A | Discharge status: Home / Self Care | Source: Ambulatory Visit | Attending: Family Medicine | Admitting: Family Medicine

## 2024-04-08 DIAGNOSIS — Z1231 Encounter for screening mammogram for malignant neoplasm of breast: Secondary | ICD-10-CM

## 2024-04-13 ENCOUNTER — Other Ambulatory Visit: Payer: Self-pay | Admitting: Family Medicine

## 2024-04-13 DIAGNOSIS — R928 Other abnormal and inconclusive findings on diagnostic imaging of breast: Secondary | ICD-10-CM

## 2024-04-15 ENCOUNTER — Ambulatory Visit
Admission: RE | Admit: 2024-04-15 | Discharge: 2024-04-15 | Disposition: A | Discharge status: Home / Self Care | Source: Ambulatory Visit | Attending: Family Medicine | Admitting: Family Medicine

## 2024-04-15 ENCOUNTER — Other Ambulatory Visit: Payer: Self-pay | Admitting: Family Medicine

## 2024-04-15 DIAGNOSIS — N6320 Unspecified lump in the left breast, unspecified quadrant: Secondary | ICD-10-CM

## 2024-04-15 DIAGNOSIS — R928 Other abnormal and inconclusive findings on diagnostic imaging of breast: Secondary | ICD-10-CM

## 2024-04-18 ENCOUNTER — Other Ambulatory Visit (HOSPITAL_COMMUNITY): Payer: Self-pay | Admitting: Diagnostic Radiology

## 2024-04-18 ENCOUNTER — Ambulatory Visit
Admission: RE | Admit: 2024-04-18 | Discharge: 2024-04-18 | Disposition: A | Discharge status: Home / Self Care | Source: Ambulatory Visit | Attending: Family Medicine | Admitting: Family Medicine

## 2024-04-18 DIAGNOSIS — N6320 Unspecified lump in the left breast, unspecified quadrant: Secondary | ICD-10-CM

## 2024-04-19 LAB — SURGICAL PATHOLOGY

## 2024-04-22 NOTE — Procedures (Signed)
Result scanned to media

## 2024-04-26 ENCOUNTER — Other Ambulatory Visit: Payer: Self-pay | Admitting: Family Medicine

## 2024-04-26 DIAGNOSIS — R21 Rash and other nonspecific skin eruption: Secondary | ICD-10-CM

## 2024-04-27 NOTE — Telephone Encounter (Signed)
Patient did request this refill. 

## 2024-04-28 ENCOUNTER — Ambulatory Visit: Admitting: Obstetrics and Gynecology

## 2024-04-29 ENCOUNTER — Telehealth: Payer: Self-pay | Admitting: *Deleted

## 2024-04-29 NOTE — Telephone Encounter (Signed)
-----   Message from Strawberry H sent at 04/29/2024 11:17 AM EDT ----- Genna called to let her know. She decided she will call the office back when she can schedule. ----- Message ----- From: Brutus Kate SAILOR, RN Sent: 04/29/2024   8:31 AM EDT To: Crystal [name] Iba  She can be rescheduled to next available appt.   [facility] ----- Message ----- From: Iba Crystal [name] Sent: 04/29/2024   8:12 AM EDT To: Kate SAILOR Brutus, RN  Hey this pt just came in thinking her appointment was for today but it was yesterday. She apologize for missing it. If you can reach out to her so she can reschedule the surgery consult that be much appreciated.

## 2024-04-29 NOTE — Telephone Encounter (Signed)
Routing to Dr. Silva FYI.  

## 2024-04-30 NOTE — Telephone Encounter (Signed)
 Has surgery consult with Dr. Glennon on 06/23/24.  Encounter closed.

## 2024-06-17 ENCOUNTER — Other Ambulatory Visit: Payer: Self-pay

## 2024-06-17 ENCOUNTER — Other Ambulatory Visit: Payer: Self-pay | Admitting: Family Medicine

## 2024-06-17 DIAGNOSIS — F988 Other specified behavioral and emotional disorders with onset usually occurring in childhood and adolescence: Secondary | ICD-10-CM

## 2024-06-17 MED ORDER — AMPHETAMINE-DEXTROAMPHETAMINE 20 MG PO TABS
20.0000 mg | ORAL_TABLET | Freq: Two times a day (BID) | ORAL | 0 refills | Status: DC
  Filled 2024-06-17: qty 60, 30d supply, fill #0

## 2024-06-23 ENCOUNTER — Ambulatory Visit: Admitting: Obstetrics and Gynecology

## 2024-06-23 ENCOUNTER — Encounter: Payer: Self-pay | Admitting: Obstetrics and Gynecology

## 2024-06-23 VITALS — BP 122/70 | HR 77 | Ht 63.78 in | Wt 251.4 lb

## 2024-06-23 DIAGNOSIS — N950 Postmenopausal bleeding: Secondary | ICD-10-CM | POA: Diagnosis not present

## 2024-06-23 DIAGNOSIS — E2839 Other primary ovarian failure: Secondary | ICD-10-CM

## 2024-06-23 DIAGNOSIS — R87612 Low grade squamous intraepithelial lesion on cytologic smear of cervix (LGSIL): Secondary | ICD-10-CM

## 2024-06-23 DIAGNOSIS — D219 Benign neoplasm of connective and other soft tissue, unspecified: Secondary | ICD-10-CM | POA: Diagnosis not present

## 2024-06-23 DIAGNOSIS — N946 Dysmenorrhea, unspecified: Secondary | ICD-10-CM

## 2024-06-23 DIAGNOSIS — R9389 Abnormal findings on diagnostic imaging of other specified body structures: Secondary | ICD-10-CM | POA: Diagnosis not present

## 2024-06-23 DIAGNOSIS — N921 Excessive and frequent menstruation with irregular cycle: Secondary | ICD-10-CM

## 2024-06-23 MED ORDER — PROGESTERONE MICRONIZED 100 MG PO CAPS: 100.0000 mg | ORAL_CAPSULE | Freq: Every evening | ORAL | 3 refills | Status: AC

## 2024-06-23 NOTE — Patient Instructions (Signed)
 Locations you can schedule your bone scan are:  The [facility] bone scan location scheduling line is [phone]  [facility] is [phone]  [facility] radiology [phone]   [facility] [phone]  [facility]: [phone]    Another option if they are behind is [facility] and their number is [phone].  I can send the referral if you want to go there.    Please let me know if you have any trouble scheduling. This is important for management of osteoporosis or osteopenia.   I can sit down with you after the scan to discuss results and treatment.  Dr. Glennon Ramp- Last colonoscopy in 2018 and they recommend 10 years, but with the bleeding and to be thorough, can we return for repeat colonoscopy sooner to ensure nothing is concerning on this side of it?
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
## 2024-07-03 NOTE — Progress Notes (Unsigned)
 No chief complaint on file.   Crystal [name] is a 58 y.o. female who presents for a complete physical and follow-up on chronic problems.   She has the following concerns:  She is planning to have laparoscopic hysterectomy with Dr. Glennon for symptomatic fibroids and postmenopausal bleeding.  She had colposcopy and EMB with Dr. Nikki on 7/1--path showed LGSIL, benign endometrium. Pelvic US  02/2024 showed multiple intramural fibroids.  Stress--She continues to try and walk between her routes, and participate in Women's bible study, which helps her moods.  In April she reported that her daughter was helping to take the baby to daycare, trying to be more involved with the baby. She is in daycare, and her mother helps out a lot. Her stepdad is now on hospice, hasn't told her mom yet (her husband), doesn't want to make her worry more, as she has anxiety. They visit daily.  He hasn't been eating/drinking much.  Has been on hospice x 2 weeks. Crystal [name] is his healthcare POA. ***UPDATE    Diabetes. A1c was 6.3% on last check in 12/2023, on metformin  2000 mg daily. She previously had taken Trulicity  (0.75 mg), stopped when had L neck swelling. At her visit in April her diet had been good until the month prior,  when she had a lot of stress about her stepdad, baby's surgery. She admitted to having more soda when stressed (at her mom's house, she doesn't buy any).    ***UPDATE-- She continues to walk and drink water. Not always using the smaller plates (which she had been doing to keep portions smaller).  Having fast food 2x/week. Drinks 100% juice, 4-8 ounces daily (grape or cranberry), 3-4x/week, usually 4 oz. She mostly drinks water. Has gone back to some soda on days she is very stressed (12 oz). She doesn't buy it, but her mother keeps the soda at her house.   Sugars are running ***  Denies hypoglycemia, polydipsia, polyuria. She denies numbness/tingling/burning or sores in her feet. Last  diabetic eye exam was 09/2023, no retinopathy.     Left thyroid  nodule--she underwent L thyroid  biopsy in 10/2020, results were inconclusive, insufficient cells.  Options given to pt included re-try in 3 mos, vs surgical excision of L thyroid , vs recheck US . She was referred to Dr. Vernetta, planned for surgery, was delayed due to her back surgery.  Ultrasound was repeated in April 2024 and nodule was stable.  She saw Dr. Vernetta in 01/2023, who ordered CT, done 02/2023, unremarkable otherwise.  She then saw Dr. Jesus in 03/2023, who didn't feel that any further treatment was indicated. She currently denies any neck mass, swelling, nodule or trouble swallowing.     Hypertension-- She reports compliance with her medications (losartan -HCT), denies side effects.    BP's have been running ***  Denies dizziness, chest pain. Denies edema. Headaches? *** (sinus/stress?)   BP Readings from Last 3 Encounters:  06/23/24 122/70  03/22/24 130/84  03/09/24 124/82       Hyperlipidemia: She is tolerating daily Crestor  without side effects (previously didn't tolerate atorvastatin  due to myalgias).  Lipids were at goal on last check.   Lab Results  Component Value Date   CHOL 113 12/31/2023   HDL 36 (L) 12/31/2023   LDLCALC 59 12/31/2023   TRIG 92 12/31/2023   CHOLHDL 3.1 12/31/2023      ADHD: She is taking Adderall during the week only.  Taking 1 pill in the morning, 1/2 in the afternoon; takes a full tablet in  the afternoon only when she needs to read/focus (ie bible study), about 3x/week. Overall, this medication is effective and she denies side effects.  Last filled #60 on 06/17/24.     Iron deficiency anemia: She is s/p EGD and Colonoscopy in August, 2018 (reactive gastropathy, inflammation in stomach, internal hemorrhoids and diverticulosis on colonoscopy). Gastroenterologist did not see a GI source for iron deficiency, suspected related to menstrual losses. As reported above, she has  fibroids, PMB. Planning for hysterectomy.  Lab Results  Component Value Date   WBC 6.6 12/31/2023   HGB 13.1 12/31/2023   HCT 39.9 12/31/2023   MCV 85 12/31/2023   PLT 281 12/31/2023    Allergies--controlled with flonase  and claritin.    H/o vitamin D  deficiency--low in 2016-2017, normal since 2018, when taking 2000 IU daily. Last level was 52.4 in 2019.  She continues to take 2000 IU daily.    Immunization History  Administered Date(s) Administered   Influenza Whole 06/02/2011   Influenza, Seasonal, Injecte, Preservative Fre 10/21/2012, 07/01/2023   Influenza,inj,Quad PF,6+ Mos 06/09/2013, 07/03/2014, 07/09/2015, 06/23/2016, 09/02/2017, 06/16/2018, 06/29/2019, 05/23/2021, 06/05/2022   Influenza-Unspecified 07/14/2020   PFIZER Comirnaty(Gray Top)Covid-19 Tri-Sucrose Vaccine 01/16/2021   PFIZER(Purple Top)SARS-COV-2 Vaccination 01/14/2020, 02/04/2020   PNEUMOCOCCAL CONJUGATE-20 12/31/2023   Pfizer(Comirnaty)Fall Seasonal Vaccine 12 years and older 10/06/2022, 07/01/2023   Pneumococcal Polysaccharide-23 10/21/2012, 07/15/2023   Tdap 09/20/2013, 07/29/2023   Zoster Recombinant(Shingrix ) 07/02/2018, 09/13/2018   Last Pap smear: 01/2024 with Dr. Nikki, LGSIL, colpo 03/22/24, LGSIL Last mammogram: 03/2024. She underwent L breast biopsy on 04/18/24 with benign pathology: FIBROCYSTIC CHANGE WITH FOCAL USUAL DUCT HYPERPLASIA (UDH) AND COLUMNAR CELL       CHANGE.       - MICROCALCIFICATIONS PRESENT.       - NO MALIGNANCY IDENTIFIED   Last colonoscopy: 04/2017 Last DEXA: never Dentist: every 6 months. Ophtho: yearly, last 09/2022 Exercise:   Walks between her routes, several times/day, totalling 40 minutes/d, Monday through Friday. Dances at home. Carries 29# granddaughter. Does back stretches.  Sleep study 06/2022 IMPRESSIONS - Minimal obstructive sleep apnea occurred during this study (AHI = 5.0/h). At upper limit of normal. Clinical impact is unlikely. - The patient had minimal or  no oxygen desaturation during the study (Min O2 = 91%) - Patient snored. - Treatment would be based on clinical judgment. Suggest maintaining normal weight and try to sleep off flat of back.    PMH, PSH, SH and FH were reviewed and updated      ROS:  The patient denies anorexia, fever, vision changes, decreased hearing, ear pain, sore throat, chest pain, palpitations, dizziness, syncope, dyspnea on exertion, cough, swelling, nausea, vomiting, diarrhea, constipation, abdominal pain, melena, indigestion/heartburn, hematuria, incontinence, vaginal discharge, odor or itch, genital lesions, numbness, tingling, weakness, tremor, suspicious skin lesions, depression, anxiety, abnormal bleeding/bruising, or enlarged lymph nodes.  Menses-*** Denies hot flashes, night sweats.  Allergies are controlled with claritin, not yet using flonase . Carpal tunnel syndrome is improved, wears braces when driving. No trouble swallowing. No knee or back pain. No tics, no trouble sleeping, or other SE from ADD meds Stress     PHYSICAL EXAM:  There were no vitals taken for this visit.  Wt Readings from Last 3 Encounters:  06/23/24 251 lb 6.4 oz (114 kg)  02/04/24 241 lb (109.3 kg)  12/31/23 238 lb (108 kg)    General Appearance:     Alert, cooperative, no distress, appears stated age. She elected not to change into a gown for today's exam.  Head:     Normocephalic, without obvious abnormality, atraumatic   Eyes:     PERRL, conjunctiva/corneas clear, EOM's intact, fundi benign   Ears:     Normal TM's and external ear canals   Nose:    No drainage or sinus tenderness  Throat:    Normal, no lesions  Neck:    Supple, no lymphadenopathy;  thyroid : nontender, very mild diffuse enlargement, no discrete mass/nodule appreciated; no carotid bruit or JVD   Back:     No spinal or CVA tenderness.  Lungs:      Clear to auscultation bilaterally without wheezes, rales or ronchi; respirations unlabored   Chest  Wall:     No tenderness or deformity    Heart:     Regular rate and rhythm, S1 and S2 normal, no murmur, rub or gallop   Breast Exam:     Deferred to GYN  Abdomen:      Soft, obese, non-tender, nondistended, normoactive bowel sounds,  no masses, no hepatosplenomegaly   Genitalia:     Deferred to GYN        Extremities:    No clubbing, cyanosis or edema.   Pulses:    2+ and symmetric all extremities   Skin:    Skin color, texture, turgor normal, no rashes or lesions, though skin exam is limited due to not changing into gown.  Normal skin exam of feet, normal monofilament exam.  Lymph nodes:    Cervical, supraclavicular nodes normal   Neurologic:    Normal strength, sensation and gait                     Psych:   Normal mood, affect, hygiene and grooming   Normal diabetic foot exam   ***DIABETIC FOOT EXAM  ASSESSMENT/PLAN:  Flu, COVID (give and/or decline) A1c TSH, urine microalb, c-met Cbc only if recent bleeding (normal CBC in April) or symptoms   Did stepdad pass?   Discussed monthly self breast exams and yearly mammograms; at least 30 minutes of aerobic activity at least 5 days/week; weight bearing exercise at least 2x/week; proper sunscreen use reviewed; healthy diet, including goals of calcium  and vitamin D  intake and alcohol recommendations (less than or equal to 1 drink/day) reviewed; regular seatbelt use; changing batteries in smoke detectors.  Immunization recommendations discussed. Continue yearly flu shots, given today.*** COVID booster given today. *** Colonoscopy recommendations reviewed, UTD  F/u 6 mos med check (fasting), sooner prn.

## 2024-07-04 ENCOUNTER — Ambulatory Visit: Admitting: Family Medicine

## 2024-07-04 ENCOUNTER — Encounter: Payer: Self-pay | Admitting: Family Medicine

## 2024-07-04 VITALS — BP 136/80 | HR 80 | Ht 64.0 in | Wt 252.0 lb

## 2024-07-04 DIAGNOSIS — E66813 Obesity, class 3: Secondary | ICD-10-CM

## 2024-07-04 DIAGNOSIS — Z0000 Encounter for general adult medical examination without abnormal findings: Secondary | ICD-10-CM | POA: Diagnosis not present

## 2024-07-04 DIAGNOSIS — M545 Low back pain, unspecified: Secondary | ICD-10-CM | POA: Diagnosis not present

## 2024-07-04 DIAGNOSIS — E1169 Type 2 diabetes mellitus with other specified complication: Secondary | ICD-10-CM | POA: Diagnosis not present

## 2024-07-04 DIAGNOSIS — I10 Essential (primary) hypertension: Secondary | ICD-10-CM

## 2024-07-04 DIAGNOSIS — E1159 Type 2 diabetes mellitus with other circulatory complications: Secondary | ICD-10-CM | POA: Diagnosis not present

## 2024-07-04 DIAGNOSIS — Z23 Encounter for immunization: Secondary | ICD-10-CM

## 2024-07-04 DIAGNOSIS — E559 Vitamin D deficiency, unspecified: Secondary | ICD-10-CM | POA: Diagnosis not present

## 2024-07-04 DIAGNOSIS — F988 Other specified behavioral and emotional disorders with onset usually occurring in childhood and adolescence: Secondary | ICD-10-CM | POA: Diagnosis not present

## 2024-07-04 DIAGNOSIS — Z6841 Body Mass Index (BMI) 40.0 and over, adult: Secondary | ICD-10-CM

## 2024-07-04 DIAGNOSIS — E785 Hyperlipidemia, unspecified: Secondary | ICD-10-CM

## 2024-07-04 DIAGNOSIS — I152 Hypertension secondary to endocrine disorders: Secondary | ICD-10-CM

## 2024-07-04 LAB — POCT URINALYSIS DIP (PROADVANTAGE DEVICE)
Bilirubin, UA: NEGATIVE
Blood, UA: NEGATIVE
Glucose, UA: NEGATIVE mg/dL
Ketones, POC UA: NEGATIVE mg/dL
Leukocytes, UA: NEGATIVE
Nitrite, UA: NEGATIVE
Protein Ur, POC: NEGATIVE mg/dL
Specific Gravity, Urine: 1.005
Urobilinogen, Ur: 0.2
pH, UA: 6 (ref 5.0–8.0)

## 2024-07-04 LAB — POCT GLYCOSYLATED HEMOGLOBIN (HGB A1C): Hemoglobin A1C: 6.8 % — AB (ref 4.0–5.6)

## 2024-07-04 MED ORDER — LOSARTAN POTASSIUM-HCTZ 100-25 MG PO TABS: 1.0000 | ORAL_TABLET | Freq: Every day | ORAL | 1 refills | Status: AC

## 2024-07-04 MED ORDER — METFORMIN HCL ER 500 MG PO TB24
2000.0000 mg | ORAL_TABLET | Freq: Every day | ORAL | 1 refills | Status: AC
  Filled 2024-10-22: qty 360, 90d supply, fill #0

## 2024-07-04 NOTE — Patient Instructions (Addendum)
  HEALTH MAINTENANCE RECOMMENDATIONS:  It is recommended that you get at least 30 minutes of aerobic exercise at least 5 days/week (for weight loss, you may need as much as 60-90 minutes). This can be any activity that gets your heart rate up. This can be divided in 10-15 minute intervals if needed, but try and build up your endurance at least once a week.  Weight bearing exercise is also recommended twice weekly.  Eat a healthy diet with lots of vegetables, fruits and fiber.  Colorful foods have a lot of vitamins (ie green vegetables, tomatoes, red peppers, etc).  Limit sweet tea, regular sodas and alcoholic beverages, all of which has a lot of calories and sugar.  Up to 1 alcoholic drink daily may be beneficial for women (unless trying to lose weight, watch sugars).  Drink a lot of water.  Calcium  recommendations are 1200-1500 mg daily (1500 mg for postmenopausal women or women without ovaries), and vitamin D  1000 IU daily.  This should be obtained from diet and/or supplements (vitamins), and calcium  should not be taken all at once, but in divided doses.  Monthly self breast exams and yearly mammograms for women over the age of 55 is recommended.  Sunscreen of at least SPF 30 should be used on all sun-exposed parts of the skin when outside between the hours of 10 am and 4 pm (not just when at beach or pool, but even with exercise, golf, tennis, and yard work!)  Use a sunscreen that says broad spectrum so it covers both UVA and UVB rays, and make sure to reapply every 1-2 hours.  Remember to change the batteries in your smoke detectors when changing your clock times in the spring and fall. Carbon monoxide detectors are recommended for your home.  Use your seat belt every time you are in a car, and please drive safely and not be distracted with cell phones and texting while driving.  Periodically try and check evening sugars. This can help understand which foods are affecting your sugars, and  which should be avoided/limited.  I included a handout on potassium-rich foods in case your potassium is low, as cause for your leg cramps.  If your potassium is normal, you can consider taking a magnesium  glycinate supplement to see if this helps.  Please try and eat something throughout the day (to help with your metabolism--eating just one meal fairly late at night isn't good for you).  Think about healthy quick foods--baby carrots, apples, yogurt, protein drink such as [employer].  Try and eat more during the day, smaller meals at night. Ideally try and eat earlier.  I encourage you to look into counseling (EAP through work, if still available, vs through your insurance). We briefly mentioned the Let Them theory book by Michalene Simpers.

## 2024-07-05 ENCOUNTER — Ambulatory Visit: Payer: Self-pay | Admitting: Family Medicine

## 2024-07-05 LAB — COMPREHENSIVE METABOLIC PANEL WITH GFR
ALT: 15 IU/L (ref 0–32)
AST: 18 IU/L (ref 0–40)
Albumin: 4.1 g/dL (ref 3.8–4.9)
Alkaline Phosphatase: 71 IU/L (ref 49–135)
BUN/Creatinine Ratio: 14 (ref 9–23)
BUN: 12 mg/dL (ref 6–24)
Bilirubin Total: 0.5 mg/dL (ref 0.0–1.2)
CO2: 25 mmol/L (ref 20–29)
Calcium: 9.6 mg/dL (ref 8.7–10.2)
Chloride: 97 mmol/L (ref 96–106)
Creatinine, Ser: 0.86 mg/dL (ref 0.57–1.00)
Globulin, Total: 2.6 g/dL (ref 1.5–4.5)
Glucose: 96 mg/dL (ref 70–99)
Potassium: 3.4 mmol/L — ABNORMAL LOW (ref 3.5–5.2)
Sodium: 136 mmol/L (ref 134–144)
Total Protein: 6.7 g/dL (ref 6.0–8.5)
eGFR: 79 mL/min/1.73 (ref >59)

## 2024-07-05 LAB — TSH: TSH: 1.28 u[IU]/mL (ref 0.450–4.500)

## 2024-07-05 LAB — MICROALBUMIN / CREATININE URINE RATIO
Creatinine, Urine: 28.1 mg/dL
Microalb/Creat Ratio: <11 mg/g{creat} (ref 0–29)
Microalbumin, Urine: <3 ug/mL

## 2024-08-02 ENCOUNTER — Ambulatory Visit: Payer: Self-pay | Admitting: Obstetrics and Gynecology

## 2024-08-02 ENCOUNTER — Ambulatory Visit (HOSPITAL_BASED_OUTPATIENT_CLINIC_OR_DEPARTMENT_OTHER)
Admission: RE | Admit: 2024-08-02 | Discharge: 2024-08-02 | Disposition: A | Discharge status: Home / Self Care | Source: Ambulatory Visit | Attending: Obstetrics and Gynecology | Admitting: Obstetrics and Gynecology

## 2024-08-02 DIAGNOSIS — E2839 Other primary ovarian failure: Secondary | ICD-10-CM | POA: Insufficient documentation

## 2024-08-08 ENCOUNTER — Other Ambulatory Visit: Payer: Self-pay

## 2024-08-08 ENCOUNTER — Other Ambulatory Visit: Payer: Self-pay | Admitting: Family Medicine

## 2024-08-08 ENCOUNTER — Other Ambulatory Visit (HOSPITAL_COMMUNITY): Payer: Self-pay

## 2024-08-08 DIAGNOSIS — F988 Other specified behavioral and emotional disorders with onset usually occurring in childhood and adolescence: Secondary | ICD-10-CM

## 2024-08-08 MED ORDER — AMPHETAMINE-DEXTROAMPHETAMINE 20 MG PO TABS
20.0000 mg | ORAL_TABLET | Freq: Two times a day (BID) | ORAL | 0 refills | Status: DC
  Filled 2024-08-08: qty 60, 30d supply, fill #0

## 2024-08-08 NOTE — Telephone Encounter (Signed)
 Is this okay to refill?

## 2024-09-12 ENCOUNTER — Other Ambulatory Visit: Payer: Self-pay | Admitting: Family Medicine

## 2024-09-12 DIAGNOSIS — N63 Unspecified lump in unspecified breast: Secondary | ICD-10-CM

## 2024-09-27 ENCOUNTER — Telehealth: Payer: Self-pay | Admitting: Gastroenterology

## 2024-09-27 DIAGNOSIS — Z0289 Encounter for other administrative examinations: Secondary | ICD-10-CM

## 2024-09-27 NOTE — Telephone Encounter (Signed)
 Good morning Dr. Leigh,   Patient called stating that Dr. Glennon had sent over a referral for her to have sooner colonoscopy due to patient with PMB and anemia. Patient is not due for colonoscopy until 2028.   Will you please review and advise on scheduling patient?  Thank you.

## 2024-09-27 NOTE — Telephone Encounter (Signed)
 Got it - sounds like she has had some rectal bleeding and they have recommended a colonoscopy since her last exam was in 2018. Okay to direct book for colonoscopy in the [facility] if she wishes to proceed with it. Thanks

## 2024-09-28 NOTE — Telephone Encounter (Signed)
 Dr. Leigh,  I called patient to advise her that we could go ahead and get her scheduled. I advised her that your next avalible appointment was 2/10. She states she thinks that she is supposed to have the procedure before she has her Hysterectomy on 1/30. Patient asked that I send a message back to you to see if you would reach out to Dr. Glennon and see when she needs the procedure.   Please advise.

## 2024-09-28 NOTE — Telephone Encounter (Signed)
 If she wants to do it sooner than my first available opening I can have her booked with one of my partners who has availability prior to January 30, if that is her preference.  Unfortunately yes my next available opening is on February 10.  Please let me know, if that is her preference can book with anyone with an opening before me and let me know when / who that is so I can tell them about her situation / case.  Thanks

## 2024-09-29 ENCOUNTER — Other Ambulatory Visit: Payer: Self-pay | Admitting: Family Medicine

## 2024-09-29 ENCOUNTER — Other Ambulatory Visit (HOSPITAL_COMMUNITY): Payer: Self-pay

## 2024-09-29 DIAGNOSIS — F988 Other specified behavioral and emotional disorders with onset usually occurring in childhood and adolescence: Secondary | ICD-10-CM

## 2024-09-29 MED ORDER — AMPHETAMINE-DEXTROAMPHETAMINE 20 MG PO TABS
20.0000 mg | ORAL_TABLET | Freq: Two times a day (BID) | ORAL | 0 refills | Status: AC
  Filled 2024-09-29: qty 60, 30d supply, fill #0

## 2024-09-29 NOTE — Telephone Encounter (Signed)
 Is this okay to refill?

## 2024-09-30 ENCOUNTER — Other Ambulatory Visit (HOSPITAL_COMMUNITY): Payer: Self-pay

## 2024-09-30 NOTE — Telephone Encounter (Signed)
 I called patient to advise on recommendations. She stated she is going to contact Dr. Glennon on Monday and see how she needs to proceed. I advised her that was okay and to call back to let us  know how to proceed on our end.

## 2024-10-06 ENCOUNTER — Encounter: Payer: Self-pay | Admitting: *Deleted

## 2024-10-13 NOTE — Telephone Encounter (Signed)
 Spoke with patient. Pre-op r/s to 10/19/24 at 1530 with Dr. Glennon.  Advised patient our office would contact her and advise of any surgery scheduling changes if they occur. Questions answered.   Routing to provider for final review. Patient is agreeable to disposition. Will close encounter.

## 2024-10-17 ENCOUNTER — Encounter: Admitting: Obstetrics and Gynecology

## 2024-10-18 ENCOUNTER — Encounter (HOSPITAL_COMMUNITY): Payer: Self-pay | Admitting: Obstetrics and Gynecology

## 2024-10-18 ENCOUNTER — Encounter (HOSPITAL_COMMUNITY): Payer: Self-pay

## 2024-10-18 NOTE — Progress Notes (Signed)
 Surgical Instructions  Your procedure is scheduled on :  Friday January 30th, 2026 Report to [facility] Main Entrance A at 5:30 AM, then check in the Admitting office. Any questions or running late day of surgery :  call [phone]  Questions prior to your surgery day:  call [phone], Monday -- Friday 8am - 4pm. If you experience any cold or flu symptoms such as cough, fever, chills, shortness of breath, etc. between now and you scheduled surgery, please notify your surgeon office.   Remember: Do Not eat any food after midnight the night before surgery.    This includes No water,  candy,  gum, and mints.  Take these medicines the morning of surgery with A SIPS OF WATER:  NONE   May take these medicines IF NEEDED:  NONE   One week prior to surgery, STOP taking any Aspirin  (unless otherwise instructed by your surgeon) Aleve , Naproxen , ibuprofen , Motrin , Advil , Goody's, BC's, all herbal medications/ supplements, fish oil, and non-prescription vitamins.  Do NOT Smoke (tobacco/ vaping) and Do Not drink alcohol for 24 hours prior to your procedure.  For those patients that use a CPAP.  Please bring your CPAP/ mask/ tubing with them day of surgery . Anesthesia may ask recovery room nurse to use and if you stay the night you be asked to use it.  You will be asked to removed any contacts, glasses, piercing's, hearing aid's, dentures/ partials prior to surgery.  Please bring cases/ container/ solution/ etc., for them day of surgery.   Patients discharged the day of surgery will NOT be allowed to drive home.  You must have responsible driver and caregiver to stay at home with you the next 24 hours.  SURGICAL WAITING ROOM VISITATION Patients may have no more than 2 support people in the waiting area - if more than 2 , these visitors may rotate.  Pre-op nurse will coordinate an appropriate time for 1 Adult support person, who may not rotate, to accompany patient in pre-op.  Aware some patients  may have certain circumstances, speak to pre-op nurse day of surgery.  Children under the age 34 must have an adult with them who is not the patient and must remain in the main waiting area with an adult.  If the patient needs to stay at the hospital during part of their recovery, the visitor guidelines for inpatient rooms apply.  Please refer to the [facility] website for the visitor guidelines for any additional information.  If you received a COVID test during your pre-op visit it is requested that you wear a mask when out in public, stay away from anyone that may not be feeling well and notify your surgeon if you develop symptoms.  If you have been in contact with anyone that has tested positive in the past 10 days notify your surgeon.     [facility] - Preparing for Surgery  Before surgery, you can play an important role. Because skin is not sterile, it needs to be as free of germs as possible. You can reduce the number of germs on your skin by washing with CHG (chlorhexidine  gluconate) soap before surgery. CHG is an antiseptic cleaner which kills germs and bonds with the skin to continue killing germs even after washing. Oral hygiene is also important in reducing the risk of infection. Remember to brush your teeth with your regular toothpaste the morning of surgery.  Please DO NOT use if you have an allergy to CHG or antibacterial soaps. If  your skin becomes reddened/irritated stop using the CHG and inform your Pre-op nurse day of surgery.  DO NOT shave (including legs and genital area) for at least 48 hours prior to your CHG shower.   Please follow these instructions carefully:  Shower with CHG soap the night before surgery. If you choose to wash your hair, wash your hair first as usual with your normal shampoo. After you shampoo, rinse your hair and body thoroughly to remove the shampoo. Use CHG as you would any other liquid soap. You can apply CHG directly to the skin and wash  gently with a clean washcloth or shower sponge. Apply the CHG soap to your body ONLY FROM THE NECK DOWN. Do not use on open wounds or open sores. Avoid contact with your eyes, ears, mouth, and genitals (private parts). Wash genitals (private parts) with your normal soap. Wash thoroughly, paying special attention to the area where your surgery will be performed. Thoroughly rinse your body with warm water from the neck down. DO NOT shower/wash with your normal soap after using and rinsing off the CHG soap. DO NOT use lotions, oils, etc., after showering with CHG. Pat yourself dry with a clean towel. Wear clean pajamas. Place clean sheets on your bed the night of your CHG shower and do not sleep with pets.  Day of Surgery  DO NOT Apply any lotions,  powder,  oils,  deodorants (may use underarm deodorant),  cologne/  perfumes  or makeup Do Not wear jewelry /  piercing's/  metal/  permanent jewelry must be removed prior to arrival day of surgery. (No plastic piercing) Do Not wear nail polish,  gel polish,  artificial nails, or any other type of covering on natural finger nails (toe nails are okay) Remember to brush your teeth and rinse mouth out. Put on clean / comfortable clothes.  is not responsible for valuables/ personal belongings

## 2024-10-18 NOTE — Progress Notes (Signed)
 Spoke w/ via phone for pre-op interview--- [facility] Lab needs dos---- CBC, T&S, A1C, BMP and EKG. Lab appt 10/19/24 at 1100. UPT and CBG AM of surgery        Lab results------ COVID test -----patient states asymptomatic no test needed Arrive at -------0530 NPO after MN NO Solid Food.   Pre-Surgery Ensure or G2:  Med rec completed Medications to take morning of surgery -----NONE Diabetic medication -----NONE AM of surgery.  GLP1 agonist last dose: GLP1 instructions:  Patient instructed no nail polish to be worn day of surgery Patient instructed to bring photo id and insurance card day of surgery Patient aware to have Driver (ride ) / caregiver    for 24 hours after surgery - Family member Guerry Pesa. Patient Special Instructions ----- CHG shower night before surgery. Pre-Op special Instructions -----  Patient verbalized understanding of instructions that were given at this phone interview. Patient denies chest pain, sob, fever, cough at the interview.

## 2024-10-19 ENCOUNTER — Encounter (HOSPITAL_COMMUNITY)
Admission: RE | Admit: 2024-10-19 | Discharge: 2024-10-19 | Disposition: A | Discharge status: Home / Self Care | Source: Ambulatory Visit | Attending: Obstetrics and Gynecology | Admitting: Obstetrics and Gynecology

## 2024-10-19 ENCOUNTER — Ambulatory Visit: Admitting: Obstetrics and Gynecology

## 2024-10-19 ENCOUNTER — Other Ambulatory Visit: Payer: Self-pay | Admitting: Obstetrics and Gynecology

## 2024-10-19 ENCOUNTER — Telehealth: Payer: Self-pay | Admitting: Obstetrics and Gynecology

## 2024-10-19 ENCOUNTER — Encounter: Payer: Self-pay | Admitting: Obstetrics and Gynecology

## 2024-10-19 ENCOUNTER — Ambulatory Visit: Payer: Self-pay | Admitting: Obstetrics and Gynecology

## 2024-10-19 VITALS — BP 120/74 | HR 86 | Ht 63.78 in | Wt 254.0 lb

## 2024-10-19 DIAGNOSIS — R9431 Abnormal electrocardiogram [ECG] [EKG]: Secondary | ICD-10-CM

## 2024-10-19 DIAGNOSIS — N946 Dysmenorrhea, unspecified: Secondary | ICD-10-CM

## 2024-10-19 DIAGNOSIS — Z01810 Encounter for preprocedural cardiovascular examination: Secondary | ICD-10-CM | POA: Diagnosis present

## 2024-10-19 DIAGNOSIS — E119 Type 2 diabetes mellitus without complications: Secondary | ICD-10-CM | POA: Insufficient documentation

## 2024-10-19 DIAGNOSIS — R87612 Low grade squamous intraepithelial lesion on cytologic smear of cervix (LGSIL): Secondary | ICD-10-CM

## 2024-10-19 DIAGNOSIS — I10 Essential (primary) hypertension: Secondary | ICD-10-CM | POA: Diagnosis not present

## 2024-10-19 DIAGNOSIS — Z01818 Encounter for other preprocedural examination: Secondary | ICD-10-CM

## 2024-10-19 DIAGNOSIS — D219 Benign neoplasm of connective and other soft tissue, unspecified: Secondary | ICD-10-CM | POA: Diagnosis not present

## 2024-10-19 DIAGNOSIS — R9389 Abnormal findings on diagnostic imaging of other specified body structures: Secondary | ICD-10-CM

## 2024-10-19 DIAGNOSIS — Z01812 Encounter for preprocedural laboratory examination: Secondary | ICD-10-CM | POA: Diagnosis present

## 2024-10-19 DIAGNOSIS — E0801 Diabetes mellitus due to underlying condition with hyperosmolarity with coma: Secondary | ICD-10-CM

## 2024-10-19 DIAGNOSIS — N950 Postmenopausal bleeding: Secondary | ICD-10-CM

## 2024-10-19 LAB — TYPE AND SCREEN
ABO/RH(D): O POS
Antibody Screen: NEGATIVE

## 2024-10-19 LAB — BASIC METABOLIC PANEL WITH GFR
Anion gap: 10 (ref 5–15)
BUN: 15 mg/dL (ref 6–20)
CO2: 29 mmol/L (ref 22–32)
Calcium: 9.5 mg/dL (ref 8.9–10.3)
Chloride: 102 mmol/L (ref 98–111)
Creatinine, Ser: 0.87 mg/dL (ref 0.44–1.00)
GFR, Estimated: >60 mL/min
Glucose, Bld: 182 mg/dL — ABNORMAL HIGH (ref 70–99)
Potassium: 3.8 mmol/L (ref 3.5–5.1)
Sodium: 141 mmol/L (ref 135–145)

## 2024-10-19 LAB — CBC
HCT: 39.8 % (ref 36.0–46.0)
Hemoglobin: 13 g/dL (ref 12.0–15.0)
MCH: 27.5 pg (ref 26.0–34.0)
MCHC: 32.7 g/dL (ref 30.0–36.0)
MCV: 84.1 fL (ref 80.0–100.0)
Platelets: 273 10*3/uL (ref 150–400)
RBC: 4.73 MIL/uL (ref 3.87–5.11)
RDW: 13.7 % (ref 11.5–15.5)
WBC: 6.2 10*3/uL (ref 4.0–10.5)
nRBC: 0 % (ref 0.0–0.2)

## 2024-10-19 LAB — HEMOGLOBIN A1C
Hgb A1c MFr Bld: 8.1 % — ABNORMAL HIGH (ref 4.8–5.6)
Mean Plasma Glucose: 185.77 mg/dL

## 2024-10-19 MED ORDER — OXYCODONE HCL 5 MG PO TABS: 5.0000 mg | ORAL_TABLET | ORAL | 0 refills | Status: AC | PRN

## 2024-10-19 MED ORDER — METOCLOPRAMIDE HCL 10 MG PO TABS: 10.0000 mg | ORAL_TABLET | Freq: Three times a day (TID) | ORAL | 0 refills | Status: AC | PRN

## 2024-10-19 MED ORDER — IBUPROFEN 800 MG PO TABS: 800.0000 mg | ORAL_TABLET | Freq: Three times a day (TID) | ORAL | 1 refills | Status: AC | PRN

## 2024-10-19 NOTE — Patient Instructions (Signed)
 " [facility] is [phone]  To schedule the cardiac calcium  CT score      Robotic Laparoscopic Hysterectomy, Care After  The following information offers guidance on how to care for yourself after your procedure. Your health care provider may also give you more specific instructions. If you have problems or questions, contact your health care provider. What can I expect after the procedure? After the procedure, it is common to have: Pain, bruising, and numbness around your incisions. Tiredness (fatigue).  Abdominal bloating Poor appetite. Chest discomfort that radiates to your shoulder from the carbon dioxide gas for a few days after  Vaginal discharge or spotting. You will need to use a sanitary pad after this procedure.  HEAVY BLEEDING LIKE A PERIOD IS NOT NORMAL.  PLEASE CALL YOUR PROVIDER IF SOAKING A PAD or have copious discharge and or pain.  Feelings of sadness or other emotions.  If your ovaries were also removed, it is also common to have symptoms of menopause, such as hot flashes, night sweats, and lack of sleep (insomnia).  Ovaries should stay in if at all possible until at least the age of 61. Follow these instructions at home: Medicines Take over-the-counter and prescription medicines only as told by your health care provider. Ask your health care provider if the medicine prescribed to you: Requires you to avoid driving or using machinery. You cannot drive for 24 hours after anesthesia Can cause constipation. You may need to take these actions to prevent or treat constipation: Drink enough fluid to keep your urine pale yellow. Take over-the-counter or prescription medicines. Eat foods that are high in fiber, such as beans, whole grains, and fresh fruits and vegetables. Limit foods that are high in fat and processed sugars, such as fried or sweet foods.  Also, avoid spicy foods.  NAUSEA IS COMMON THE FIRST NIGHT OF SURGERY.  IF IT LASTS BEYOND 24 HOURS, CALL YOUR  PROVIDER.  NAUSEA MEDICATION WAS GIVEN AT YOUR PREOP APPOINTMENT THAT YOU CAN TAKE AFTER SURGERY. Incision care  Follow instructions from your health care provider about how to take care of your incisions. Make sure you: LEAVE INCISION OPEN AND DRY-NO BANDAGES Leave stitches (sutures), skin glue, or adhesive strips in place UNTIL 2 WEEKS THEN REMOVE IN THE SHOWER.  If adhesive strip edges start to loosen and curl up, you may trim the loose edges. Check your incision areas every day for signs of infection. Check for: More redness, swelling, or pain. Fluid or blood. Warmth. Pus or a bad smell. Activity  Rest as told by your health care provider. Avoid sitting for a long time without moving. Get up to take short walks every 1-2 hours. This is important to improve blood flow and breathing. Ask for help if you feel weak or unsteady.  If you are sore or tired, rest. Return to your normal activities as told by your health care provider. Ask your health care provider what activities are safe for you. Do not lift, push or pull anything that is heavier than 13 lb (4.5 kg), or the limit that you are told, for 6 WEEKS after surgery or until your health care provider says that it is safe. If you were given a sedative during the procedure, it can affect you for several hours. Do not drive or operate machinery until your health care provider says that it is safe. Lifestyle Do not use any products that contain nicotine or tobacco. These products include cigarettes, chewing tobacco, and vaping devices,  such as e-cigarettes. These can delay healing after surgery. If you need help quitting, ask your health care provider. Do not drink alcohol until your health care provider approves. Take a daily multivitamin and keep a high protein diet for wound healing  DO NOT HAVE INTERCOURSE UNTIL YOU ARE INSTRUCTED THAT IT IS SAFE TO DO SO  Post operative appointments need to be scheduled at 2, 6 and 10 weeks.  You can  come anytime before these with any concerns.   Discussed and reviewed with patient risks with early intercourse or use of any foreign objects (externally or internally) can increase your risk including but not limited to the risk of vaginal cuff separation and or infection, risks for bowel involvement, risk for emergent surgery, and hospital admission with need for antibiotics.  Discussed in cases with cuff separation and bowel involvement there may be the need for colostomy placement as well.  In no situation should she have intercourse unless cleared to do so.  This can be anywhere from 10 weeks or longer after surgery.  General instructions YOU MAY TAKE SHOWERS ONLY FOR 2 WEEKS AFTER SURGERY, THEN YOU MAY USE TUBS AND HOT TUBS OR SWIM AFTER THAT Do not douche, use tampons, or have sex for at least 10 weeks, or possibly longer. You will need to have an exam done in the office to be cleared to have intercourse. If you struggle with physical or emotional changes after your procedure, speak with your health care provider or a therapist.  IF YOU HAVE BURNING WITH URINATION, PLEASE CALL YOUR DOCTOR. BLADDER INFECTIONS MAY OCCUR AFTER SURGERY Try to have someone at home with you for the first week to help with your daily chores.  Most patients are driving by the end of the first week after the robotic hysterectomy. Wear compression stockings as told by your health care provider. These stockings help to prevent blood clots and reduce swelling in your legs. Keep all follow-up visits. This is important. Contact a health care provider if: You have any of these signs of infection: Chills or a fever 169f OR GREATER. More redness, swelling, or pain around an incision. Fluid or blood coming from an incision. Warmth coming from an incision. Pus or a bad smell coming from an incision. Burning with urination. Urinary frequency or cramping.   IF YOU HAVE THESE SYMPTOMS, PLEASE CALL THE OFFICE TO COME EVALUATE  FOR A BLADDER INFECTION AT [phone] An incision opens. You feel dizzy or light-headed. You have pain or bleeding when you urinate, or you are unable to urinate. You have abnormal vaginal discharge. You have pain that does not get better with medicine. Get help right away if: You have a fever and your symptoms suddenly get worse. You have severe abdominal pain. Heavy vaginal bleeding, like a period You have chest pain or shortness of breath. You may have chest pain and shortness of breath from the CO2 gas for a few days after surgery.  This is very common.  Walking, Gas-X and motrin  will usually help relieve this discomfort You faint. You have pain, swelling, or redness in your leg.  These symptoms may represent a serious problem that is an emergency. Do not wait to see if the symptoms will go away. Get medical help right away. Call your local emergency services (911 in the [place] Do not drive yourself to the hospital. Summary  CONSTIPATION MEDICATION AFTER SURGERY: COLACE, MOM, MIRALAX, GAS X are all helpful to have on hand, if needed.  FILL ALL POSTOP MEDICATION BEFORE SURGERY    [email or web address] is a nice blog site for women preparing for the robotic hysterectomy     "

## 2024-10-19 NOTE — Progress Notes (Signed)
 Asked to take EKG, current lab work to anesthesia to be evaluated prior to surgery scheduled for Friday 10/21/24. Spoke with Dr Patrisha, okay to proceed from anesthesia standpoint. Sent message to Dr Glennon letting her know.
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
## 2024-10-19 NOTE — Telephone Encounter (Signed)
 Patient called. Explained concern with high sugars and doing sugery and risk for poor healing after and complications from that. She admits she has missed a few doses of the metformin  and has not been eating well. Offered to send mounjaro  in, but she has concerns with this and tried in the past and her thyroid  nodule enlarged while on it. H/o benign biopsy. Discussed risks of high sugars v. Benign thyroid  nodule which is more concerning. Encouraged patient to contact pcp with other medication options. Discussed she has to watch her diet. She agrees to watch it and record her fsbs. If at or over 180, discussed postponing her case. She agreed. To begin daily MVI as well Dr. Glennon

## 2024-10-19 NOTE — Anesthesia Preprocedure Evaluation (Addendum)
"                                    Anesthesia Evaluation  Patient identified by MRN, date of birth, ID band Patient awake    Reviewed: Allergy & Precautions, NPO status , Patient's Chart, lab work & pertinent test results  Airway Mallampati: III  TM Distance: >3 FB Neck ROM: Full    Dental no notable dental hx. (+) Teeth Intact, Dental Advisory Given   Pulmonary neg pulmonary ROS   Pulmonary exam normal breath sounds clear to auscultation       Cardiovascular hypertension, Pt. on medications Normal cardiovascular exam Rhythm:Regular Rate:Normal     Neuro/Psych negative neurological ROS  negative psych ROS   GI/Hepatic negative GI ROS, Neg liver ROS,,,  Endo/Other  diabetes, Type 2, Oral Hypoglycemic Agents  Class 3 obesity (BMI 44)  Renal/GU negative Renal ROS  negative genitourinary   Musculoskeletal negative musculoskeletal ROS (+)    Abdominal   Peds  (+) ADHD Hematology negative hematology ROS (+)   Anesthesia Other Findings    Reproductive/Obstetrics                              Anesthesia Physical Anesthesia Plan  ASA: 3  Anesthesia Plan: General   Post-op Pain Management: Tylenol  PO (pre-op)*, Ketamine  IV* and Toradol  IV (intra-op)*   Induction: Intravenous  PONV Risk Score and Plan: 3 and Midazolam , Dexamethasone  and Ondansetron   Airway Management Planned: Oral ETT  Additional Equipment:   Intra-op Plan:   Post-operative Plan: Extubation in OR  Informed Consent: I have reviewed the patients History and Physical, chart, labs and discussed the procedure including the risks, benefits and alternatives for the proposed anesthesia with the patient or authorized representative who has indicated his/her understanding and acceptance.     Dental advisory given  Plan Discussed with: CRNA  Anesthesia Plan Comments: (2 IVs)         Anesthesia Quick Evaluation  "

## 2024-10-19 NOTE — Telephone Encounter (Signed)
 Spoke with patient, confirmed pre-op appt for 10/19/24 at 1530 and plan per Dr. Glennon.   Will close encounter.

## 2024-10-20 ENCOUNTER — Ambulatory Visit
Admission: RE | Admit: 2024-10-20 | Discharge: 2024-10-20 | Disposition: A | Discharge status: Home / Self Care | Source: Ambulatory Visit | Attending: Family Medicine | Admitting: Family Medicine

## 2024-10-20 ENCOUNTER — Other Ambulatory Visit (HOSPITAL_COMMUNITY): Payer: Self-pay

## 2024-10-20 DIAGNOSIS — N63 Unspecified lump in unspecified breast: Secondary | ICD-10-CM

## 2024-10-20 MED ORDER — SODIUM CHLORIDE 0.9 % IV SOLN
Freq: Once | INTRAVENOUS | Status: DC
  Filled 2024-10-20: qty 10

## 2024-10-21 ENCOUNTER — Other Ambulatory Visit: Payer: Self-pay

## 2024-10-21 ENCOUNTER — Encounter (HOSPITAL_COMMUNITY)
Admission: RE | Disposition: A | Discharge status: Home / Self Care | Payer: Self-pay | Source: Home / Self Care | Attending: Obstetrics and Gynecology

## 2024-10-21 ENCOUNTER — Ambulatory Visit (HOSPITAL_COMMUNITY): Payer: Self-pay | Admitting: Anesthesiology

## 2024-10-21 ENCOUNTER — Encounter (HOSPITAL_COMMUNITY): Payer: Self-pay | Admitting: Obstetrics and Gynecology

## 2024-10-21 ENCOUNTER — Ambulatory Visit (HOSPITAL_COMMUNITY)
Admission: RE | Admit: 2024-10-21 | Discharge: 2024-10-21 | Disposition: A | Discharge status: Home / Self Care | Attending: Obstetrics and Gynecology | Admitting: Obstetrics and Gynecology

## 2024-10-21 DIAGNOSIS — E785 Hyperlipidemia, unspecified: Secondary | ICD-10-CM | POA: Diagnosis not present

## 2024-10-21 DIAGNOSIS — Z6841 Body Mass Index (BMI) 40.0 and over, adult: Secondary | ICD-10-CM | POA: Insufficient documentation

## 2024-10-21 DIAGNOSIS — D219 Benign neoplasm of connective and other soft tissue, unspecified: Secondary | ICD-10-CM

## 2024-10-21 DIAGNOSIS — N736 Female pelvic peritoneal adhesions (postinfective): Secondary | ICD-10-CM | POA: Insufficient documentation

## 2024-10-21 DIAGNOSIS — R87612 Low grade squamous intraepithelial lesion on cytologic smear of cervix (LGSIL): Secondary | ICD-10-CM

## 2024-10-21 DIAGNOSIS — R9389 Abnormal findings on diagnostic imaging of other specified body structures: Secondary | ICD-10-CM | POA: Diagnosis not present

## 2024-10-21 DIAGNOSIS — I10 Essential (primary) hypertension: Secondary | ICD-10-CM | POA: Insufficient documentation

## 2024-10-21 DIAGNOSIS — F909 Attention-deficit hyperactivity disorder, unspecified type: Secondary | ICD-10-CM | POA: Insufficient documentation

## 2024-10-21 DIAGNOSIS — Z79899 Other long term (current) drug therapy: Secondary | ICD-10-CM | POA: Insufficient documentation

## 2024-10-21 DIAGNOSIS — N946 Dysmenorrhea, unspecified: Secondary | ICD-10-CM | POA: Insufficient documentation

## 2024-10-21 DIAGNOSIS — Z01818 Encounter for other preprocedural examination: Secondary | ICD-10-CM

## 2024-10-21 DIAGNOSIS — E66813 Obesity, class 3: Secondary | ICD-10-CM | POA: Insufficient documentation

## 2024-10-21 DIAGNOSIS — N840 Polyp of corpus uteri: Secondary | ICD-10-CM | POA: Insufficient documentation

## 2024-10-21 DIAGNOSIS — N8003 Adenomyosis of the uterus: Secondary | ICD-10-CM | POA: Insufficient documentation

## 2024-10-21 DIAGNOSIS — E119 Type 2 diabetes mellitus without complications: Secondary | ICD-10-CM | POA: Diagnosis not present

## 2024-10-21 DIAGNOSIS — N950 Postmenopausal bleeding: Secondary | ICD-10-CM | POA: Insufficient documentation

## 2024-10-21 DIAGNOSIS — D259 Leiomyoma of uterus, unspecified: Secondary | ICD-10-CM | POA: Insufficient documentation

## 2024-10-21 DIAGNOSIS — N83201 Unspecified ovarian cyst, right side: Secondary | ICD-10-CM | POA: Insufficient documentation

## 2024-10-21 DIAGNOSIS — N870 Mild cervical dysplasia: Secondary | ICD-10-CM | POA: Insufficient documentation

## 2024-10-21 DIAGNOSIS — Z7984 Long term (current) use of oral hypoglycemic drugs: Secondary | ICD-10-CM | POA: Insufficient documentation

## 2024-10-21 LAB — GLUCOSE, CAPILLARY
Glucose-Capillary: 189 mg/dL — ABNORMAL HIGH (ref 70–99)
Glucose-Capillary: 201 mg/dL — ABNORMAL HIGH (ref 70–99)

## 2024-10-21 MED ORDER — FENTANYL CITRATE (PF) 100 MCG/2ML IJ SOLN
INTRAMUSCULAR | Status: AC
  Filled 2024-10-21: qty 2

## 2024-10-21 MED ORDER — INSULIN ASPART 100 UNIT/ML IJ SOLN
INTRAMUSCULAR | Status: AC
  Filled 2024-10-21: qty 4

## 2024-10-21 MED ORDER — PHENYLEPHRINE HCL-NACL 20-0.9 MG/250ML-% IV SOLN
INTRAVENOUS | Status: AC
  Filled 2024-10-21: qty 750

## 2024-10-21 MED ORDER — ROCURONIUM BROMIDE 10 MG/ML (PF) SYRINGE
PREFILLED_SYRINGE | INTRAVENOUS | Status: AC
  Filled 2024-10-21: qty 10

## 2024-10-21 MED ORDER — LACTATED RINGERS IV SOLN: INTRAVENOUS | Status: DC

## 2024-10-21 MED ORDER — DEXAMETHASONE SOD PHOSPHATE PF 10 MG/ML IJ SOLN
INTRAMUSCULAR | Status: DC | PRN
  Administered 2024-10-21: 5 mg via INTRAVENOUS

## 2024-10-21 MED ORDER — ONDANSETRON HCL 4 MG/2ML IJ SOLN
INTRAMUSCULAR | Status: DC | PRN
  Administered 2024-10-21 (×2): 4 mg via INTRAVENOUS

## 2024-10-21 MED ORDER — ORAL CARE MOUTH RINSE: 15.0000 mL | Freq: Once | OROMUCOSAL | Status: AC

## 2024-10-21 MED ORDER — MIDAZOLAM HCL (PF) 2 MG/2ML IJ SOLN
INTRAMUSCULAR | Status: DC | PRN
  Administered 2024-10-21: 2 mg via INTRAVENOUS

## 2024-10-21 MED ORDER — SODIUM CHLORIDE 0.9 % IV SOLN
INTRAVENOUS | Status: DC | PRN
  Administered 2024-10-21: 1000 mL

## 2024-10-21 MED ORDER — FENTANYL CITRATE (PF) 100 MCG/2ML IJ SOLN
25.0000 ug | INTRAMUSCULAR | Status: DC | PRN
  Administered 2024-10-21: 50 ug via INTRAVENOUS

## 2024-10-21 MED ORDER — OXYCODONE HCL 5 MG/5ML PO SOLN: 5.0000 mg | Freq: Once | ORAL | Status: DC | PRN

## 2024-10-21 MED ORDER — METRONIDAZOLE 500 MG/100ML IV SOLN
INTRAVENOUS | Status: AC
  Filled 2024-10-21: qty 100

## 2024-10-21 MED ORDER — PHENYLEPHRINE 80 MCG/ML (10ML) SYRINGE FOR IV PUSH (FOR BLOOD PRESSURE SUPPORT)
PREFILLED_SYRINGE | INTRAVENOUS | Status: DC | PRN
  Administered 2024-10-21: 80 ug via INTRAVENOUS

## 2024-10-21 MED ORDER — GABAPENTIN 300 MG PO CAPS
ORAL_CAPSULE | ORAL | Status: AC
  Filled 2024-10-21: qty 1

## 2024-10-21 MED ORDER — ACETAMINOPHEN 500 MG PO TABS: 1000.0000 mg | ORAL_TABLET | Freq: Once | ORAL | Status: DC

## 2024-10-21 MED ORDER — AMISULPRIDE (ANTIEMETIC) 5 MG/2ML IV SOLN
INTRAVENOUS | Status: AC
  Filled 2024-10-21: qty 4

## 2024-10-21 MED ORDER — PHENYLEPHRINE HCL-NACL 20-0.9 MG/250ML-% IV SOLN
INTRAVENOUS | Status: DC | PRN
  Administered 2024-10-21: 25 ug/min via INTRAVENOUS

## 2024-10-21 MED ORDER — 0.9 % SODIUM CHLORIDE (POUR BTL) OPTIME
TOPICAL | Status: DC | PRN
  Administered 2024-10-21: 1000 mL

## 2024-10-21 MED ORDER — POVIDONE-IODINE 10 % EX SWAB
2.0000 | Freq: Once | CUTANEOUS | Status: AC
  Administered 2024-10-21: 2 via TOPICAL

## 2024-10-21 MED ORDER — PROPOFOL 10 MG/ML IV BOLUS
INTRAVENOUS | Status: DC | PRN
  Administered 2024-10-21: 150 mg via INTRAVENOUS

## 2024-10-21 MED ORDER — INSULIN ASPART 100 UNIT/ML IJ SOLN
0.0000 [IU] | INTRAMUSCULAR | Status: DC | PRN
  Administered 2024-10-21: 4 [IU] via SUBCUTANEOUS

## 2024-10-21 MED ORDER — AMISULPRIDE (ANTIEMETIC) 5 MG/2ML IV SOLN
10.0000 mg | Freq: Once | INTRAVENOUS | Status: AC | PRN
  Administered 2024-10-21: 10 mg via INTRAVENOUS

## 2024-10-21 MED ORDER — KETAMINE HCL 50 MG/5ML IJ SOSY
PREFILLED_SYRINGE | INTRAMUSCULAR | Status: AC
  Filled 2024-10-21: qty 5

## 2024-10-21 MED ORDER — BUPIVACAINE HCL (PF) 0.5 % IJ SOLN
INTRAMUSCULAR | Status: AC
  Filled 2024-10-21: qty 30

## 2024-10-21 MED ORDER — CHLORHEXIDINE GLUCONATE 0.12 % MT SOLN
OROMUCOSAL | Status: AC
  Filled 2024-10-21: qty 15

## 2024-10-21 MED ORDER — LIDOCAINE 2% (20 MG/ML) 5 ML SYRINGE
INTRAMUSCULAR | Status: DC | PRN
  Administered 2024-10-21: 100 mg via INTRAVENOUS

## 2024-10-21 MED ORDER — KETAMINE HCL 10 MG/ML IJ SOLN
INTRAMUSCULAR | Status: DC | PRN
  Administered 2024-10-21: 20 mg via INTRAVENOUS
  Administered 2024-10-21: 10 mg via INTRAVENOUS

## 2024-10-21 MED ORDER — PROPOFOL 10 MG/ML IV BOLUS
INTRAVENOUS | Status: AC
  Filled 2024-10-21: qty 20

## 2024-10-21 MED ORDER — CHLORHEXIDINE GLUCONATE 0.12 % MT SOLN
15.0000 mL | Freq: Once | OROMUCOSAL | Status: AC
  Administered 2024-10-21: 15 mL via OROMUCOSAL

## 2024-10-21 MED ORDER — HEMOSTATIC AGENTS (NO CHARGE) OPTIME
TOPICAL | Status: DC | PRN
  Administered 2024-10-21: 1

## 2024-10-21 MED ORDER — ONDANSETRON HCL 4 MG/2ML IJ SOLN
INTRAMUSCULAR | Status: AC
  Filled 2024-10-21: qty 2

## 2024-10-21 MED ORDER — DEXMEDETOMIDINE HCL IN NACL 80 MCG/20ML IV SOLN
INTRAVENOUS | Status: DC | PRN
  Administered 2024-10-21: 12 ug via INTRAVENOUS
  Administered 2024-10-21 (×2): 8 ug via INTRAVENOUS

## 2024-10-21 MED ORDER — ACETAMINOPHEN 500 MG PO TABS
ORAL_TABLET | ORAL | Status: AC
  Filled 2024-10-21: qty 2

## 2024-10-21 MED ORDER — SODIUM CHLORIDE 0.9 % IV SOLN
2.0000 g | INTRAVENOUS | Status: AC
  Administered 2024-10-21: 2 g via INTRAVENOUS
  Filled 2024-10-21: qty 2

## 2024-10-21 MED ORDER — OXYCODONE HCL 5 MG PO TABS: 5.0000 mg | ORAL_TABLET | Freq: Once | ORAL | Status: DC | PRN

## 2024-10-21 MED ORDER — LIDOCAINE 2% (20 MG/ML) 5 ML SYRINGE
INTRAMUSCULAR | Status: AC
  Filled 2024-10-21: qty 5

## 2024-10-21 MED ORDER — MIDAZOLAM HCL 2 MG/2ML IJ SOLN
INTRAMUSCULAR | Status: AC
  Filled 2024-10-21: qty 2

## 2024-10-21 MED ORDER — DEXAMETHASONE SOD PHOSPHATE PF 10 MG/ML IJ SOLN
INTRAMUSCULAR | Status: AC
  Filled 2024-10-21: qty 1

## 2024-10-21 MED ORDER — BUPIVACAINE HCL (PF) 0.5 % IJ SOLN
INTRAMUSCULAR | Status: DC | PRN
  Administered 2024-10-21: 20 mL

## 2024-10-21 MED ORDER — SODIUM CHLORIDE 0.9 % IV SOLN
INTRAVENOUS | Status: AC
  Filled 2024-10-21: qty 2

## 2024-10-21 MED ORDER — SUGAMMADEX SODIUM 200 MG/2ML IV SOLN
INTRAVENOUS | Status: DC | PRN
  Administered 2024-10-21: 400 mg via INTRAVENOUS

## 2024-10-21 MED ORDER — KETOROLAC TROMETHAMINE 30 MG/ML IJ SOLN
INTRAMUSCULAR | Status: DC | PRN
  Administered 2024-10-21: 15 mg via INTRAVENOUS

## 2024-10-21 MED ORDER — FENTANYL CITRATE (PF) 100 MCG/2ML IJ SOLN
INTRAMUSCULAR | Status: DC | PRN
  Administered 2024-10-21: 100 ug via INTRAVENOUS
  Administered 2024-10-21: 25 ug via INTRAVENOUS
  Administered 2024-10-21: 50 ug via INTRAVENOUS

## 2024-10-21 MED ORDER — GABAPENTIN 300 MG PO CAPS
300.0000 mg | ORAL_CAPSULE | ORAL | Status: AC
  Administered 2024-10-21: 300 mg via ORAL

## 2024-10-21 MED ORDER — ROCURONIUM BROMIDE 10 MG/ML (PF) SYRINGE
PREFILLED_SYRINGE | INTRAVENOUS | Status: DC | PRN
  Administered 2024-10-21: 100 mg via INTRAVENOUS

## 2024-10-21 MED ORDER — ACETAMINOPHEN 500 MG PO TABS
1000.0000 mg | ORAL_TABLET | ORAL | Status: AC
  Administered 2024-10-21: 1000 mg via ORAL

## 2024-10-21 MED ORDER — METRONIDAZOLE 500 MG/100ML IV SOLN
500.0000 mg | Freq: Once | INTRAVENOUS | Status: AC
  Administered 2024-10-21: 500 mg via INTRAVENOUS

## 2024-10-21 NOTE — Anesthesia Procedure Notes (Addendum)
 Procedure Name: Intubation Date/Time: 10/21/2024 7:36 AM  Performed by: Kearney Rosina SAILOR, RNPre-anesthesia Checklist: Patient identified, Emergency Drugs available, Suction available and Patient being monitored Patient Re-evaluated:Patient Re-evaluated prior to induction Oxygen Delivery Method: Circle system utilized Preoxygenation: Pre-oxygenation with 100% oxygen Induction Type: IV induction Ventilation: Mask ventilation without difficulty Laryngoscope Size: Mac and 4 Grade View: Grade I Tube type: Oral Tube size: 7.5 mm Number of attempts: 1 Airway Equipment and Method: Stylet and Oral airway Placement Confirmation: ETT inserted through vocal cords under direct vision, positive ETCO2 and breath sounds checked- equal and bilateral Secured at: 23 cm Tube secured with: Tape Dental Injury: Teeth and Oropharynx as per pre-operative assessment  Comments: Atraumatic intubation

## 2024-10-21 NOTE — Transfer of Care (Signed)
 Immediate Anesthesia Transfer of Care Note  Patient: Crystal [name]  Procedure(s) Performed: HYSTERECTOMY, TOTAL, ROBOT-ASSISTED, LAPAROSCOPIC, WITH BILATERAL SALPINGO-OOPHORECTOMY AND LYSIS OF ADHESIONS (Bilateral: Abdomen) CYSTOSCOPY (Urethra)  Patient Location: PACU  Anesthesia Type:General  Level of Consciousness: drowsy and patient cooperative  Airway & Oxygen Therapy: Patient Spontanous Breathing and Patient connected to face mask oxygen  Post-op Assessment: Report given to RN, Post -op Vital signs reviewed and stable, and Patient moving all extremities  Post vital signs: Reviewed and stable  Last Vitals:  Vitals Value Taken Time  BP 158/93 10/21/24 09:45  Temp    Pulse 83 10/21/24 09:45  Resp 20 10/21/24 09:45  SpO2 91 % 10/21/24 09:45  Vitals shown include unfiled device data.  Last Pain:  Vitals:   10/21/24 0612  TempSrc: Oral  PainSc: 0-No pain      Patients Stated Pain Goal: 4 (10/21/24 9387)  Complications: There were no known notable events for this encounter.

## 2024-10-21 NOTE — Op Note (Signed)
 10/21/2024   [id number]  Donny JINNY Guan         OPERATIVE REPORT   Preop Diagnosis: fibroids, menorrhagia, symptomatic fibroids  Procedure: robotic hysterectomy, BSO, cystoscopy   Surgeon: Dr. Almarie Sauer Aletta Edmunds Assistant: Judyann Rattler, RN Circulator: Arlys Daine HERO, RN Scrub Person: Dyane Greig KATHRYNE Rubin, Hadassah MALACHI Arbutus Suzen HERO Circulator Assistant: Storm Maryelizabeth CROME, RN RN First Assistant: Rattler Judyann CROME, RN    Fluids: please see anesthesia report   Complications: None Anesthesia: General     Findings:  boggy fibroid 28m uterus, normal ovaries and tubes Cystoscopy at the end of the case with normal bladder and patent ureters bilaterally.  umbilical hernia noted with omentum in it. Lysis of adhesions from omentum to lower anterior abdominal wall Estimated blood loss: 100cc   Specimens: Uterus, cervix and bilateral tubes and ovaries   Disposition of specimen: Pathology           Patient is taken to the operating room. She is placed in the supine position. She is a running IV in place. Informed consent was present on the chart. SCDs on her lower extremities and functioning properly. Patient was positioned while she was awake.  Her legs were placed in the low lithotomy position in [place] stirrups. Her arms were tucked by the side.  General endotracheal anesthesia was administered by the anesthesia staff without difficulty.       Dura prep was then used to prep the abdomen and Hibiclens  was used to prep the inner thighs, perineum and vagina. Once 3 minutes had past the patient was draped in a normal standard fashion. A proper time out was performed and everyone agreed.  The legs were lifted to the high lithotomy position. A bivalve speculum was inserted into the vagina and the anterior lip of the cervix was grasped with single-tooth tenaculum.  The uterus sounded to  12 cm. Pratt dilators were used to dilate the cervix.  The RUMI uterine manipulator  was obtained inserted into the endometrial cavity and the bulb of the disposable tip was inflated with 8 cc of normal saline. There was a good fit of the KOH ring around the cervix. The tenaculum and bivavle speculum was removed. There is also good manipulation of the uterus.  A Foley catheter was placed to straight drain.  Clear urine was noted. Legs were lowered to the low lithotomy position and attention was turned the abdomen.   Superior to the umbilicus, marcaine  0.25% used to anesthetize the skin.  Using #11 blade, 8mm skin incision was made.  The 8mm robotic trocar and sleeve was inserted under direct visualization.  CO2 gas was  started and patient was placed in trendelenburg position.  Two additional 8mm ports were placed under direct visualization in the left and right lower quadrant.     Ureters were identifies.  Attention was turned to the left side.  The left IP ligament was noted away from the ureters and this was desiccated and transected with the vessel sealer.  The uterine ovarian pedicle was serially clamped cauterized and incised. Left round ligament was serially clamped cauterized and incised. The anterior and posterior peritoneum of the inferior leaf of the broad ligament were opened. The beginning of the bladder flap was created.  The bladder was taken down below the level of the KOH ring. The left uterine artery skeletonized and then just superior to the KOH ring this vessel was serially clamped, cauterized, and incised.   Attention was turned the right  side.  The uterus was placed on stretch to the opposite side.    The right IP ligament was away from the ureter and this was desiccated and transected with the vessel sealer.  Then the right uterine ovarian pedicle was serially clamped cauterized and incised. Next the right round ligament was serially clamped cauterized and incised. The anterior posterior peritoneum of the inferiorly for the broad ligament were opened. The anterior  peritoneum was carried across to the dissection on the left side. The remainder of the bladder flap was created using sharp dissection. The bladder was well below the level of the KOH ring. The right uterine artery skeletonized. Then the right uterine artery, above the level of the KOH ring, was serially clamped cauterized and incised. The uterus was devascularized at this point.   The colpotomy was performed.  This was carried around a circumferential fashion until the vaginal mucosa was completely incised in the specimen was freed.  The specimen was then delivered to the vagina.  A vaginal occlusive device was used to maintain the pneumoperitoneum   Instruments were changed with a needle driver and prograsp.  Using a 9 inch  zero V-lock suture, the cuff was closed by incorporating the anterior and posterior vaginal mucosa in each stitch. This was carried across all the way to the left corner and a running fashion. Two stitches were brought back towards the midline and the suture was cut flush with the vagina. The needle was brought out the pelvis. The pelvis was irrigated. All pedicles were inspected. No bleeding was noted.   CO2 pressures were lowered to 8mm Hg.  Again, no bleeding was noted.  Ureters were noted deep in the pelvis to be peristalsing.  At this point the procedure was completed.  The remaining instruments were removed.  The ports were removed under direct visualization of the laparoscope and the pneumoperitoneum was relieved.   The skin was then closed with subcuticular stitches of 3-0 Vicryl. The skin was cleansed Dermabond was applied. Attention was then turned the vagina and the cuff was inspected. No bleeding was noted.  The Foley catheter was removed.  Cystoscopy was performed.  No sutures or bladder injuries were noted.  Ureters were noted with normal urine jets from each one was seen.  Foley was left out after the cystoscopic fluid was drained and cystoscope removed.  Sponge, lap,  needle, instrument counts were correct x2. Patient tolerated the procedure very well. She was awakened from anesthesia, extubated and taken to recovery in stable condition.      Dr. Glennon

## 2024-10-21 NOTE — Interval H&P Note (Signed)
 History and Physical Interval Note:  10/21/2024 7:16 AM  Crystal [name]  has presented today for surgery, with the diagnosis of PMB, thickened endometrium, pap of cervix with LGSIL, fibroids, menorrhagia with irregular cycle, dysmenorrhea.  The various methods of treatment have been discussed with the patient and family. After consideration of risks, benefits and other options for treatment, the patient has consented to  Procedures: HYSTERECTOMY, TOTAL, ROBOT-ASSISTED, LAPAROSCOPIC, WITH BILATERAL SALPINGO-OOPHORECTOMY (Bilateral) CYSTOSCOPY (N/A) as a surgical intervention.  The patient's history has been reviewed, patient examined, no change in status, stable for surgery.  I have reviewed the patient's chart and labs.  Questions were answered to the patient's satisfaction.     Crystal [name]

## 2024-10-22 ENCOUNTER — Encounter (HOSPITAL_COMMUNITY): Payer: Self-pay | Admitting: Obstetrics and Gynecology

## 2024-10-23 ENCOUNTER — Other Ambulatory Visit: Payer: Self-pay

## 2024-10-23 ENCOUNTER — Other Ambulatory Visit (HOSPITAL_COMMUNITY): Payer: Self-pay

## 2024-10-24 ENCOUNTER — Ambulatory Visit: Payer: Self-pay | Admitting: Obstetrics and Gynecology

## 2024-10-24 LAB — SURGICAL PATHOLOGY

## 2024-10-27 ENCOUNTER — Encounter: Payer: Self-pay | Admitting: Obstetrics and Gynecology

## 2024-10-27 ENCOUNTER — Other Ambulatory Visit: Payer: Self-pay | Admitting: Obstetrics and Gynecology

## 2024-10-27 ENCOUNTER — Telehealth: Payer: Self-pay | Admitting: *Deleted

## 2024-10-27 DIAGNOSIS — E1169 Type 2 diabetes mellitus with other specified complication: Secondary | ICD-10-CM

## 2024-10-27 MED ORDER — BLOOD GLUCOSE TEST VI STRP: 1.0000 | ORAL_STRIP | 0 refills | Status: AC

## 2024-10-27 MED ORDER — BLOOD GLUCOSE MONITORING SUPPL DEVI: 1.0000 | 0 refills | Status: AC

## 2024-10-27 MED ORDER — ONETOUCH VERIO VI STRP: ORAL_STRIP | 3 refills | Status: AC

## 2024-10-27 MED ORDER — LANCET DEVICE MISC: 1.0000 | 0 refills | Status: AC

## 2024-10-27 MED ORDER — LANCETS MISC: 1.0000 | 0 refills | Status: AC

## 2024-10-27 MED ORDER — ONETOUCH DELICA PLUS LANCET33G MISC: 3 refills | Status: AC

## 2024-10-27 NOTE — Telephone Encounter (Signed)
 Spoke with patient. S/P RLH, BSO, Cysto, LOA on 10/21/24.   Patient reports abdomen feels heavy and tender at incision sites. Reports bruising and warmth at incision sites. No redness or drainage. Denies fever, feels cold. Denies vaginal bleeding or urinary symptoms. Only taking motrin  800 mg at HS. Denies pain, states only tender. Denies N/V. Report having 3-4 loose to watery stools per day, rectum is itching. Has been applying antibacterial hand lotion to rectal area for comfort. States this is not normal for her for BMs.   Patient is going to attempt to upload images of incisions in MyChart for Dr. Glennon to view, would be available for OV if needed. Advised I will send to Dr. Glennon to review and f/u with recommendations. Patient agreeable.

## 2024-10-27 NOTE — Telephone Encounter (Signed)
 Routing to Dr. Glennon. See telephone encounter dated 10/27/24.

## 2024-10-27 NOTE — Telephone Encounter (Signed)
 See MyChart images.   Denies drainage, redness. Reports warmth at incision sites.

## 2024-11-02 ENCOUNTER — Encounter: Admitting: Obstetrics and Gynecology

## 2024-11-03 ENCOUNTER — Encounter: Admitting: Obstetrics and Gynecology

## 2024-12-01 ENCOUNTER — Encounter: Admitting: Obstetrics and Gynecology

## 2024-12-29 ENCOUNTER — Encounter: Admitting: Obstetrics and Gynecology

## 2025-01-02 ENCOUNTER — Ambulatory Visit: Payer: Self-pay | Admitting: Family Medicine

## 2025-02-07 ENCOUNTER — Ambulatory Visit: Admitting: Obstetrics and Gynecology

## 2025-07-05 ENCOUNTER — Encounter: Payer: Self-pay | Admitting: Family Medicine
